# Patient Record
Sex: Female | Born: 1965 | Race: White | Hispanic: No | Smoking: Never smoker
Health system: Southern US, Community
[De-identification: ages and names within clinical notes are randomized; demographics above are authoritative.]

## PROBLEM LIST (undated history)

## (undated) DIAGNOSIS — L309 Dermatitis, unspecified: Secondary | ICD-10-CM

## (undated) DIAGNOSIS — J302 Other seasonal allergic rhinitis: Secondary | ICD-10-CM

## (undated) DIAGNOSIS — Z87442 Personal history of urinary calculi: Secondary | ICD-10-CM

## (undated) DIAGNOSIS — Z9889 Other specified postprocedural states: Secondary | ICD-10-CM

## (undated) DIAGNOSIS — G43909 Migraine, unspecified, not intractable, without status migrainosus: Secondary | ICD-10-CM

## (undated) DIAGNOSIS — R351 Nocturia: Secondary | ICD-10-CM

## (undated) DIAGNOSIS — K219 Gastro-esophageal reflux disease without esophagitis: Secondary | ICD-10-CM

## (undated) DIAGNOSIS — E039 Hypothyroidism, unspecified: Secondary | ICD-10-CM

## (undated) DIAGNOSIS — T4145XA Adverse effect of unspecified anesthetic, initial encounter: Secondary | ICD-10-CM

## (undated) DIAGNOSIS — M797 Fibromyalgia: Secondary | ICD-10-CM

## (undated) DIAGNOSIS — N201 Calculus of ureter: Secondary | ICD-10-CM

## (undated) DIAGNOSIS — Z973 Presence of spectacles and contact lenses: Secondary | ICD-10-CM

## (undated) DIAGNOSIS — F32A Depression, unspecified: Secondary | ICD-10-CM

## (undated) DIAGNOSIS — G4730 Sleep apnea, unspecified: Secondary | ICD-10-CM

## (undated) DIAGNOSIS — N3010 Interstitial cystitis (chronic) without hematuria: Secondary | ICD-10-CM

## (undated) DIAGNOSIS — T8859XA Other complications of anesthesia, initial encounter: Secondary | ICD-10-CM

## (undated) DIAGNOSIS — N200 Calculus of kidney: Secondary | ICD-10-CM

## (undated) DIAGNOSIS — M1990 Unspecified osteoarthritis, unspecified site: Secondary | ICD-10-CM

## (undated) DIAGNOSIS — R350 Frequency of micturition: Secondary | ICD-10-CM

## (undated) DIAGNOSIS — G5701 Lesion of sciatic nerve, right lower limb: Secondary | ICD-10-CM

## (undated) DIAGNOSIS — R112 Nausea with vomiting, unspecified: Secondary | ICD-10-CM

## (undated) DIAGNOSIS — F329 Major depressive disorder, single episode, unspecified: Secondary | ICD-10-CM

## (undated) HISTORY — PX: WRIST SURGERY: SHX841

## (undated) HISTORY — PX: APPENDECTOMY: SHX54

## (undated) HISTORY — PX: GANGLION CYST EXCISION: SHX1691

## (undated) HISTORY — PX: WISDOM TOOTH EXTRACTION: SHX21

## (undated) HISTORY — PX: LITHOTRIPSY: SUR834

## (undated) HISTORY — PX: OTHER SURGICAL HISTORY: SHX169

## (undated) HISTORY — DX: Calculus of kidney: N20.0

---
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
## 1998-09-24 ENCOUNTER — Encounter: Payer: Self-pay | Admitting: Emergency Medicine

## 1998-09-24 ENCOUNTER — Emergency Department (HOSPITAL_COMMUNITY): Admission: EM | Admit: 1998-09-24 | Discharge: 1998-09-24 | Payer: Self-pay | Admitting: Emergency Medicine

## 1999-02-02 ENCOUNTER — Encounter (INDEPENDENT_AMBULATORY_CARE_PROVIDER_SITE_OTHER): Payer: Self-pay

## 1999-02-02 ENCOUNTER — Ambulatory Visit (HOSPITAL_COMMUNITY): Admission: RE | Admit: 1999-02-02 | Discharge: 1999-02-02 | Payer: Self-pay | Admitting: Obstetrics and Gynecology

## 1999-02-18 ENCOUNTER — Other Ambulatory Visit: Admission: RE | Admit: 1999-02-18 | Discharge: 1999-02-18 | Payer: Self-pay | Admitting: Obstetrics and Gynecology

## 1999-06-28 ENCOUNTER — Other Ambulatory Visit: Admission: RE | Admit: 1999-06-28 | Discharge: 1999-06-28 | Payer: Self-pay | Admitting: Obstetrics & Gynecology

## 1999-11-05 ENCOUNTER — Encounter: Admission: RE | Admit: 1999-11-05 | Discharge: 1999-11-05 | Payer: Self-pay | Admitting: Obstetrics and Gynecology

## 1999-11-05 ENCOUNTER — Encounter: Payer: Self-pay | Admitting: Obstetrics and Gynecology

## 1999-12-04 ENCOUNTER — Inpatient Hospital Stay (HOSPITAL_COMMUNITY): Admission: AD | Admit: 1999-12-04 | Discharge: 1999-12-04 | Payer: Self-pay | Admitting: Obstetrics & Gynecology

## 1999-12-09 HISTORY — PX: APPENDECTOMY: SHX54

## 1999-12-18 ENCOUNTER — Encounter (INDEPENDENT_AMBULATORY_CARE_PROVIDER_SITE_OTHER): Payer: Self-pay

## 1999-12-18 ENCOUNTER — Inpatient Hospital Stay (HOSPITAL_COMMUNITY): Admission: AD | Admit: 1999-12-18 | Discharge: 1999-12-22 | Payer: Self-pay | Admitting: Obstetrics & Gynecology

## 1999-12-18 ENCOUNTER — Encounter: Payer: Self-pay | Admitting: Obstetrics & Gynecology

## 2000-01-29 ENCOUNTER — Inpatient Hospital Stay (HOSPITAL_COMMUNITY): Admission: AD | Admit: 2000-01-29 | Discharge: 2000-02-01 | Payer: Self-pay | Admitting: Obstetrics and Gynecology

## 2000-02-02 ENCOUNTER — Encounter: Admission: RE | Admit: 2000-02-02 | Discharge: 2000-05-02 | Payer: Self-pay | Admitting: Obstetrics and Gynecology

## 2000-06-02 ENCOUNTER — Encounter: Admission: RE | Admit: 2000-06-02 | Discharge: 2000-07-02 | Payer: Self-pay | Admitting: Obstetrics and Gynecology

## 2000-06-27 ENCOUNTER — Other Ambulatory Visit: Admission: RE | Admit: 2000-06-27 | Discharge: 2000-06-27 | Payer: Self-pay | Admitting: Obstetrics and Gynecology

## 2000-07-10 ENCOUNTER — Encounter: Payer: Self-pay | Admitting: Obstetrics and Gynecology

## 2000-07-10 ENCOUNTER — Ambulatory Visit (HOSPITAL_COMMUNITY): Admission: RE | Admit: 2000-07-10 | Discharge: 2000-07-10 | Payer: Self-pay | Admitting: Obstetrics and Gynecology

## 2000-08-02 ENCOUNTER — Encounter: Admission: RE | Admit: 2000-08-02 | Discharge: 2000-09-01 | Payer: Self-pay | Admitting: Obstetrics and Gynecology

## 2000-10-02 ENCOUNTER — Encounter: Admission: RE | Admit: 2000-10-02 | Discharge: 2000-11-01 | Payer: Self-pay | Admitting: Obstetrics and Gynecology

## 2000-11-02 ENCOUNTER — Encounter: Admission: RE | Admit: 2000-11-02 | Discharge: 2000-12-02 | Payer: Self-pay | Admitting: Obstetrics and Gynecology

## 2001-01-02 ENCOUNTER — Encounter: Admission: RE | Admit: 2001-01-02 | Discharge: 2001-02-01 | Payer: Self-pay | Admitting: Obstetrics and Gynecology

## 2001-03-04 ENCOUNTER — Encounter: Admission: RE | Admit: 2001-03-04 | Discharge: 2001-04-03 | Payer: Self-pay | Admitting: Obstetrics and Gynecology

## 2001-04-04 ENCOUNTER — Encounter: Admission: RE | Admit: 2001-04-04 | Discharge: 2001-05-04 | Payer: Self-pay | Admitting: Obstetrics and Gynecology

## 2001-05-11 ENCOUNTER — Inpatient Hospital Stay (HOSPITAL_COMMUNITY): Admission: AD | Admit: 2001-05-11 | Discharge: 2001-05-11 | Payer: Self-pay | Admitting: Obstetrics and Gynecology

## 2001-05-12 ENCOUNTER — Ambulatory Visit (HOSPITAL_COMMUNITY): Admission: RE | Admit: 2001-05-12 | Discharge: 2001-05-12 | Payer: Self-pay | Admitting: Obstetrics and Gynecology

## 2001-05-12 ENCOUNTER — Encounter (INDEPENDENT_AMBULATORY_CARE_PROVIDER_SITE_OTHER): Payer: Self-pay | Admitting: Specialist

## 2001-06-26 ENCOUNTER — Other Ambulatory Visit: Admission: RE | Admit: 2001-06-26 | Discharge: 2001-06-26 | Payer: Self-pay | Admitting: Obstetrics and Gynecology

## 2002-05-16 ENCOUNTER — Ambulatory Visit (HOSPITAL_COMMUNITY): Admission: RE | Admit: 2002-05-16 | Discharge: 2002-05-16 | Payer: Self-pay | Admitting: Obstetrics and Gynecology

## 2002-05-16 ENCOUNTER — Encounter: Payer: Self-pay | Admitting: Obstetrics and Gynecology

## 2002-06-03 ENCOUNTER — Ambulatory Visit (HOSPITAL_COMMUNITY): Admission: RE | Admit: 2002-06-03 | Discharge: 2002-06-03 | Payer: Self-pay | Admitting: Obstetrics and Gynecology

## 2002-06-03 ENCOUNTER — Encounter: Payer: Self-pay | Admitting: Obstetrics and Gynecology

## 2002-06-14 ENCOUNTER — Other Ambulatory Visit: Admission: RE | Admit: 2002-06-14 | Discharge: 2002-06-14 | Payer: Self-pay | Admitting: Obstetrics and Gynecology

## 2002-07-14 ENCOUNTER — Encounter: Payer: Self-pay | Admitting: Obstetrics and Gynecology

## 2002-07-14 ENCOUNTER — Inpatient Hospital Stay (HOSPITAL_COMMUNITY): Admission: AD | Admit: 2002-07-14 | Discharge: 2002-07-14 | Payer: Self-pay | Admitting: Obstetrics and Gynecology

## 2002-08-06 ENCOUNTER — Encounter: Payer: Self-pay | Admitting: Obstetrics and Gynecology

## 2002-08-06 ENCOUNTER — Ambulatory Visit (HOSPITAL_COMMUNITY): Admission: RE | Admit: 2002-08-06 | Discharge: 2002-08-06 | Payer: Self-pay | Admitting: Obstetrics and Gynecology

## 2002-10-17 ENCOUNTER — Inpatient Hospital Stay (HOSPITAL_COMMUNITY): Admission: AD | Admit: 2002-10-17 | Discharge: 2002-10-17 | Payer: Self-pay | Admitting: Obstetrics and Gynecology

## 2002-12-11 ENCOUNTER — Inpatient Hospital Stay (HOSPITAL_COMMUNITY): Admission: AD | Admit: 2002-12-11 | Discharge: 2002-12-11 | Payer: Self-pay | Admitting: Obstetrics and Gynecology

## 2002-12-29 ENCOUNTER — Inpatient Hospital Stay (HOSPITAL_COMMUNITY): Admission: AD | Admit: 2002-12-29 | Discharge: 2002-12-29 | Payer: Self-pay | Admitting: Obstetrics and Gynecology

## 2003-01-07 ENCOUNTER — Inpatient Hospital Stay (HOSPITAL_COMMUNITY): Admission: AD | Admit: 2003-01-07 | Discharge: 2003-01-09 | Payer: Self-pay | Admitting: Obstetrics and Gynecology

## 2004-04-01 ENCOUNTER — Other Ambulatory Visit: Admission: RE | Admit: 2004-04-01 | Discharge: 2004-04-01 | Payer: Self-pay | Admitting: Obstetrics and Gynecology

## 2004-07-19 ENCOUNTER — Emergency Department (HOSPITAL_COMMUNITY): Admission: EM | Admit: 2004-07-19 | Discharge: 2004-07-19 | Payer: Self-pay | Admitting: Emergency Medicine

## 2004-11-11 ENCOUNTER — Ambulatory Visit (HOSPITAL_COMMUNITY): Admission: RE | Admit: 2004-11-11 | Discharge: 2004-11-11 | Payer: Self-pay | Admitting: Obstetrics and Gynecology

## 2005-01-07 ENCOUNTER — Ambulatory Visit (HOSPITAL_COMMUNITY): Admission: RE | Admit: 2005-01-07 | Discharge: 2005-01-07 | Payer: Self-pay | Admitting: Obstetrics and Gynecology

## 2005-01-23 ENCOUNTER — Inpatient Hospital Stay (HOSPITAL_COMMUNITY): Admission: AD | Admit: 2005-01-23 | Discharge: 2005-01-23 | Payer: Self-pay | Admitting: Obstetrics and Gynecology

## 2005-01-26 ENCOUNTER — Ambulatory Visit (HOSPITAL_COMMUNITY): Admission: RE | Admit: 2005-01-26 | Discharge: 2005-01-26 | Payer: Self-pay | Admitting: Obstetrics and Gynecology

## 2005-02-07 HISTORY — PX: LITHOTRIPSY: SUR834

## 2005-02-15 ENCOUNTER — Inpatient Hospital Stay (HOSPITAL_COMMUNITY): Admission: AD | Admit: 2005-02-15 | Discharge: 2005-02-15 | Payer: Self-pay | Admitting: Obstetrics and Gynecology

## 2005-04-05 ENCOUNTER — Inpatient Hospital Stay (HOSPITAL_COMMUNITY): Admission: AD | Admit: 2005-04-05 | Discharge: 2005-04-05 | Payer: Self-pay | Admitting: Obstetrics and Gynecology

## 2005-04-16 ENCOUNTER — Inpatient Hospital Stay (HOSPITAL_COMMUNITY): Admission: AD | Admit: 2005-04-16 | Discharge: 2005-04-18 | Payer: Self-pay | Admitting: Obstetrics and Gynecology

## 2005-06-20 ENCOUNTER — Ambulatory Visit (HOSPITAL_COMMUNITY): Admission: RE | Admit: 2005-06-20 | Discharge: 2005-06-20 | Payer: Self-pay | Admitting: Urology

## 2005-06-21 ENCOUNTER — Emergency Department (HOSPITAL_COMMUNITY): Admission: EM | Admit: 2005-06-21 | Discharge: 2005-06-21 | Payer: Self-pay | Admitting: *Deleted

## 2005-06-23 ENCOUNTER — Ambulatory Visit (HOSPITAL_COMMUNITY): Admission: RE | Admit: 2005-06-23 | Discharge: 2005-06-24 | Payer: Self-pay | Admitting: Urology

## 2005-06-23 ENCOUNTER — Encounter: Payer: Self-pay | Admitting: Urology

## 2005-07-28 ENCOUNTER — Other Ambulatory Visit: Admission: RE | Admit: 2005-07-28 | Discharge: 2005-07-28 | Payer: Self-pay | Admitting: Obstetrics and Gynecology

## 2005-11-25 ENCOUNTER — Ambulatory Visit: Payer: Self-pay | Admitting: Internal Medicine

## 2006-04-16 ENCOUNTER — Emergency Department (HOSPITAL_COMMUNITY): Admission: EM | Admit: 2006-04-16 | Discharge: 2006-04-17 | Payer: Self-pay | Admitting: Emergency Medicine

## 2006-04-19 ENCOUNTER — Ambulatory Visit: Payer: Self-pay | Admitting: Internal Medicine

## 2006-04-27 ENCOUNTER — Ambulatory Visit: Payer: Self-pay | Admitting: Internal Medicine

## 2006-09-05 ENCOUNTER — Telehealth: Payer: Self-pay | Admitting: Internal Medicine

## 2006-09-05 DIAGNOSIS — J3089 Other allergic rhinitis: Secondary | ICD-10-CM

## 2006-09-05 DIAGNOSIS — F322 Major depressive disorder, single episode, severe without psychotic features: Secondary | ICD-10-CM | POA: Insufficient documentation

## 2006-09-05 DIAGNOSIS — J302 Other seasonal allergic rhinitis: Secondary | ICD-10-CM | POA: Insufficient documentation

## 2006-09-05 DIAGNOSIS — Z87442 Personal history of urinary calculi: Secondary | ICD-10-CM

## 2006-09-06 ENCOUNTER — Ambulatory Visit: Payer: Self-pay | Admitting: Internal Medicine

## 2006-09-06 DIAGNOSIS — H8110 Benign paroxysmal vertigo, unspecified ear: Secondary | ICD-10-CM

## 2006-10-12 ENCOUNTER — Ambulatory Visit: Payer: Self-pay | Admitting: Internal Medicine

## 2006-10-12 DIAGNOSIS — M542 Cervicalgia: Secondary | ICD-10-CM

## 2007-07-10 ENCOUNTER — Ambulatory Visit: Payer: Self-pay | Admitting: Internal Medicine

## 2007-07-10 DIAGNOSIS — E663 Overweight: Secondary | ICD-10-CM | POA: Insufficient documentation

## 2007-08-31 ENCOUNTER — Telehealth: Payer: Self-pay | Admitting: Internal Medicine

## 2007-09-04 ENCOUNTER — Ambulatory Visit: Payer: Self-pay | Admitting: Internal Medicine

## 2007-09-04 DIAGNOSIS — B356 Tinea cruris: Secondary | ICD-10-CM | POA: Insufficient documentation

## 2007-09-27 ENCOUNTER — Encounter: Admission: RE | Admit: 2007-09-27 | Discharge: 2007-10-30 | Payer: Self-pay | Admitting: Internal Medicine

## 2007-11-01 ENCOUNTER — Encounter: Admission: RE | Admit: 2007-11-01 | Discharge: 2007-11-01 | Payer: Self-pay | Admitting: Obstetrics and Gynecology

## 2007-12-04 ENCOUNTER — Ambulatory Visit: Payer: Self-pay | Admitting: Internal Medicine

## 2007-12-04 DIAGNOSIS — J029 Acute pharyngitis, unspecified: Secondary | ICD-10-CM

## 2007-12-04 DIAGNOSIS — J069 Acute upper respiratory infection, unspecified: Secondary | ICD-10-CM | POA: Insufficient documentation

## 2007-12-28 ENCOUNTER — Telehealth: Payer: Self-pay | Admitting: Internal Medicine

## 2008-01-02 ENCOUNTER — Ambulatory Visit: Payer: Self-pay | Admitting: Internal Medicine

## 2008-01-22 ENCOUNTER — Telehealth: Payer: Self-pay | Admitting: Internal Medicine

## 2008-04-18 ENCOUNTER — Ambulatory Visit: Payer: Self-pay | Admitting: Internal Medicine

## 2008-05-23 ENCOUNTER — Ambulatory Visit: Payer: Self-pay | Admitting: Internal Medicine

## 2008-08-25 ENCOUNTER — Ambulatory Visit: Payer: Self-pay | Admitting: Internal Medicine

## 2008-10-16 ENCOUNTER — Ambulatory Visit: Payer: Self-pay | Admitting: Family Medicine

## 2008-10-16 DIAGNOSIS — R197 Diarrhea, unspecified: Secondary | ICD-10-CM

## 2008-10-28 ENCOUNTER — Ambulatory Visit: Payer: Self-pay | Admitting: Internal Medicine

## 2008-11-03 ENCOUNTER — Encounter: Admission: RE | Admit: 2008-11-03 | Discharge: 2008-11-03 | Payer: Self-pay | Admitting: Obstetrics and Gynecology

## 2009-01-23 ENCOUNTER — Telehealth: Payer: Self-pay | Admitting: Internal Medicine

## 2009-02-23 ENCOUNTER — Ambulatory Visit: Payer: Self-pay | Admitting: Internal Medicine

## 2009-06-25 ENCOUNTER — Encounter: Payer: Self-pay | Admitting: Internal Medicine

## 2009-08-26 ENCOUNTER — Ambulatory Visit: Payer: Self-pay | Admitting: Internal Medicine

## 2009-11-26 ENCOUNTER — Ambulatory Visit: Payer: Self-pay | Admitting: Internal Medicine

## 2009-11-26 DIAGNOSIS — R609 Edema, unspecified: Secondary | ICD-10-CM

## 2009-12-08 ENCOUNTER — Emergency Department (HOSPITAL_COMMUNITY): Admission: EM | Admit: 2009-12-08 | Discharge: 2009-12-08 | Payer: Self-pay | Admitting: Emergency Medicine

## 2009-12-18 ENCOUNTER — Encounter: Admission: RE | Admit: 2009-12-18 | Discharge: 2009-12-18 | Payer: Self-pay | Admitting: Obstetrics and Gynecology

## 2009-12-22 ENCOUNTER — Emergency Department (HOSPITAL_COMMUNITY): Admission: EM | Admit: 2009-12-22 | Discharge: 2009-12-22 | Payer: Self-pay | Admitting: Family Medicine

## 2010-03-11 NOTE — Assessment & Plan Note (Signed)
Summary: 6 mo rov/mm   Vital Signs:  Patient profile:   45 year old female Weight:      218 pounds Temp:     98.1 degrees F oral BP sitting:   100 / 64  (right arm) Cuff size:   regular  Vitals Entered By: Duard Brady LPN (August 26, 2009 11:21 AM) CC: 6 mos rov - ok Is Patient Diabetic? No   CC:  6 mos rov - ok.  History of Present Illness: 45 year old patient has a history of chronic depression.  She was last seen here in January and doing quite well.  She has been followed by GI and neurology and has improved on a gluten-free diet.  Apparently, celiac serologies were negative, however.  There have been some stressors.  Her husband is still unemployed.  She has been unsuccessful losing weight in spite of her diet  Allergies: 1)  ! Sulf-10  Past History:  Past Medical History: Reviewed history from 07/10/2007 and no changes required. Obesity Depression Allergic rhinitis Nephrolithiasis, hx of history of vitamin D deficiency  Review of Systems  The patient denies anorexia, fever, weight loss, weight gain, vision loss, decreased hearing, hoarseness, chest pain, syncope, dyspnea on exertion, peripheral edema, prolonged cough, headaches, hemoptysis, abdominal pain, melena, hematochezia, severe indigestion/heartburn, hematuria, incontinence, genital sores, muscle weakness, suspicious skin lesions, transient blindness, difficulty walking, depression, unusual weight change, abnormal bleeding, enlarged lymph nodes, angioedema, and breast masses.    Physical Exam  General:  overweight-appearing.  overweight-appearing.   Head:  Normocephalic and atraumatic without obvious abnormalities. No apparent alopecia or balding. Mouth:  Oral mucosa and oropharynx without lesions or exudates.  Teeth in good repair. Neck:  No deformities, masses, or tenderness noted. Lungs:  Normal respiratory effort, chest expands symmetrically. Lungs are clear to auscultation, no crackles or  wheezes. Heart:  Normal rate and regular rhythm. S1 and S2 normal without gallop, murmur, click, rub or other extra sounds. Psych:  Cognition and judgment appear intact. Alert and cooperative with normal attention span and concentration. No apparent delusions, illusions, hallucinations   Impression & Recommendations:  Problem # 1:  DIARRHEA (ICD-787.91) improved on present gluten-free diet  Problem # 2:  DEPRESSION (ICD-311)  Her updated medication list for this problem includes:    Pristiq 100 Mg Xr24h-tab (Desvenlafaxine succinate) ..... One by mouth daily    Hydroxyzine Hcl 10 Mg Tabs (Hydroxyzine hcl) ..... Qhs will continue her present regimen.  She feels that her depression is cyclical and is hopeful for improvement.  She has been on a number of SSRIs in the past.  Will consider Wellbutrin if she fails to improve  Her updated medication list for this problem includes:    Pristiq 100 Mg Xr24h-tab (Desvenlafaxine succinate) ..... One by mouth daily    Hydroxyzine Hcl 10 Mg Tabs (Hydroxyzine hcl) ..... Qhs  Complete Medication List: 1)  Maxalt 10 Mg Tabs (Rizatriptan benzoate) .... Once daily as needed migraine 2)  Terbinafine Hcl 1 % Crea (Terbinafine hcl) .... Use twice daily 3)  Pristiq 100 Mg Xr24h-tab (Desvenlafaxine succinate) .... One by mouth daily 4)  Triamcinolone Acetonide 0.1 % Crea (Triamcinolone acetonide) .... Apply two times a day 5)  Elmiron 100 Mg Caps (Pentosan polysulfate sodium) .... 2 by mouth bid 6)  Hydroxyzine Hcl 10 Mg Tabs (Hydroxyzine hcl) .... Qhs 7)  Zyrtec Allergy 10 Mg Caps (Cetirizine hcl) .... Prn  Patient Instructions: 1)  Please schedule a follow-up appointment in 3 months. 2)  It  is important that you exercise regularly at least 20 minutes 5 times a week. If you develop chest pain, have severe difficulty breathing, or feel very tired , stop exercising immediately and seek medical attention. 3)  You need to lose weight. Consider a lower calorie  diet and regular exercise.  Prescriptions: PRISTIQ 100 MG XR24H-TAB (DESVENLAFAXINE SUCCINATE) One by mouth daily  #90 x 6   Entered and Authorized by:   Gordy Savers  MD   Signed by:   Gordy Savers  MD on 08/26/2009   Method used:   Electronically to        [facility]  [address] [id number]* (retail)       [address]       [place], [place]  [address]       Ph: [phone] or [phone]       Fax: [phone]   RxID:   [id number] MAXALT 10 MG  TABS (RIZATRIPTAN BENZOATE) once daily as needed migraine  #6 Tablet x 6   Entered and Authorized by:   Gordy Savers  MD   Signed by:   Gordy Savers  MD on 08/26/2009   Method used:   Electronically to        [facility]  [address] [id number]* (retail)       [address]       [place], [place]  [address]       Ph: [phone] or [phone]       Fax: [phone]   RxID:   [id number]

## 2010-03-11 NOTE — Assessment & Plan Note (Signed)
Summary: laryngitis and edema in legs/cjr   Vital Signs:  Patient profile:   45 year old female Height:      64 inches Weight:      218 pounds BMI:     37.55 Temp:     98.7 degrees F oral Pulse rate:   80 / minute Resp:     14 per minute BP sitting:   122 / 80  (left arm)  Vitals Entered By: Willy Eddy, LPN (November 26, 2009 9:22 AM) CC: c/o laryngitis for 1-2 weeks- c/o lower extremith swelling Is Patient Diabetic? No   CC:  c/o laryngitis for 1-2 weeks- c/o lower extremith swelling.  History of Present Illness: 45 year old patient, who presents with a 10-day history of hoarseness, mild headache and sore throat.  There is been no chest pain, shortness of breath, or productive cough.  No documented fever.  She does have a history of depression and allergic rhinitis and has been on Zyrtec therapy.  She has had a history of migraines, which have been stable.  Another complaint is the live edema, worse towards the end of the day.  She has a history also of exogenous obesity  Preventive Screening-Counseling & Management  Alcohol-Tobacco     Smoking Status: never  Current Problems (verified): 1)  Diarrhea  (ICD-787.91) 2)  Pharyngitis  (ICD-462) 3)  Uri  (ICD-465.9) 4)  Tinea Cruris  (ICD-110.3) 5)  Overweight  (ICD-278.02) 6)  Neck Pain  (ICD-723.1) 7)  Vertigo, Benign Paroxysmal Position  (ICD-386.11) 8)  Nephrolithiasis, Hx of  (ICD-V13.01) 9)  Allergic Rhinitis  (ICD-477.9) 10)  Depression  (ICD-311)  Current Medications (verified): 1)  Maxalt 10 Mg  Tabs (Rizatriptan Benzoate) .... Once Daily As Needed Migraine 2)  Terbinafine Hcl 1 %  Crea (Terbinafine Hcl) .... Use Twice Daily 3)  Pristiq 100 Mg Xr24h-Tab (Desvenlafaxine Succinate) .... One By Mouth Daily 4)  Triamcinolone Acetonide 0.1 % Crea (Triamcinolone Acetonide) .... Apply Two Times A Day 5)  Elmiron 100 Mg Caps (Pentosan Polysulfate Sodium) .... 2 By Mouth Bid 6)  Hydroxyzine Hcl 10 Mg Tabs (Hydroxyzine  Hcl) .... Qhs 7)  Zyrtec Allergy 10 Mg Caps (Cetirizine Hcl) .... Prn  Allergies (verified): 1)  ! Sulf-10  Past History:  Past Medical History: Reviewed history from 07/10/2007 and no changes required. Obesity Depression Allergic rhinitis Nephrolithiasis, hx of history of vitamin D deficiency  Review of Systems       The patient complains of anorexia and peripheral edema.  The patient denies fever, weight loss, weight gain, vision loss, decreased hearing, hoarseness, chest pain, syncope, dyspnea on exertion, prolonged cough, headaches, hemoptysis, abdominal pain, melena, hematochezia, severe indigestion/heartburn, hematuria, incontinence, genital sores, muscle weakness, suspicious skin lesions, transient blindness, difficulty walking, depression, unusual weight change, abnormal bleeding, enlarged lymph nodes, angioedema, and breast masses.    Physical Exam  General:  overweight-appearing.  normal blood pressure.  Afebrile.overweight-appearing.   Head:  Normocephalic and atraumatic without obvious abnormalities. No apparent alopecia or balding. Eyes:  No corneal or conjunctival inflammation noted. EOMI. Perrla. Funduscopic exam benign, without hemorrhages, exudates or papilledema. Vision grossly normal. Ears:  External ear exam shows no significant lesions or deformities.  Otoscopic examination reveals clear canals, tympanic membranes are intact bilaterally without bulging, retraction, inflammation or discharge. Hearing is grossly normal bilaterally. Nose:  External nasal examination shows no deformity or inflammation. Nasal mucosa are pink and moist without lesions or exudates. Mouth:  Oral mucosa and oropharynx without lesions or exudates.  Teeth in good repair. Neck:  No deformities, masses, or tenderness noted. Lungs:  Normal respiratory effort, chest expands symmetrically. Lungs are clear to auscultation, no crackles or wheezes. Heart:  Normal rate and regular rhythm. S1 and S2  normal without gallop, murmur, click, rub or other extra sounds. Extremities:  1+ left pedal edema and 1+ right pedal edema.  1+ left pedal edema.     Impression & Recommendations:  Problem # 1:  URI (ICD-465.9)  Her updated medication list for this problem includes:    Zyrtec Allergy 10 Mg Caps (Cetirizine hcl) .[name]... Prn  Her updated medication list for this problem includes:    Zyrtec Allergy 10 Mg Caps (Cetirizine hcl) .[name]... Prn  Problem # 2:  PEDAL EDEMA (ICD-782.3)  Her updated medication list for this problem includes:    Furosemide 40 Mg Tabs (Furosemide) ..... One daily  Her updated medication list for this problem includes:    Furosemide 40 Mg Tabs (Furosemide) ..... One daily  Complete Medication List: 1)  Maxalt 10 Mg Tabs (Rizatriptan benzoate) .... Once daily as needed migraine 2)  Terbinafine Hcl 1 % Crea (Terbinafine hcl) .... Use twice daily 3)  Pristiq 100 Mg Xr24h-tab (Desvenlafaxine succinate) .... One by mouth daily 4)  Triamcinolone Acetonide 0.1 % Crea (Triamcinolone acetonide) .... Apply two times a day 5)  Elmiron 100 Mg Caps (Pentosan polysulfate sodium) .... 2 by mouth bid 6)  Hydroxyzine Hcl 10 Mg Tabs (Hydroxyzine hcl) .... Qhs 7)  Zyrtec Allergy 10 Mg Caps (Cetirizine hcl) .... Prn 8)  Furosemide 40 Mg Tabs (Furosemide) .... One daily  Patient Instructions: 1)  Limit your Sodium (Salt) to less than 2 grams a day(slightly less than 1/2 a teaspoon) to prevent fluid retention, swelling, or worsening of symptoms. 2)  It is important that you exercise regularly at least 20 minutes 5 times a week. If you develop chest pain, have severe difficulty breathing, or feel very tired , stop exercising immediately and seek medical attention. 3)  You need to lose weight. Consider a lower calorie diet and regular exercise.  Prescriptions: FUROSEMIDE 40 MG TABS (FUROSEMIDE) one daily  #30 x 3   Entered and Authorized by:   Gordy Savers  MD   Signed by:   Gordy Savers  MD on 11/26/2009   Method used:   Electronically to        [facility]  [address] [id number]* (retail)       [address]       [place], [place]  [address]       Ph: [phone] or [phone]       Fax: [phone]   RxID:   [id number]    Orders Added: 1)  Est. Patient Level III [[address]]

## 2010-03-11 NOTE — Letter (Signed)
[facility]   Imported By: Maryln Gottron 07/01/2009 12:24:11  _____________________________________________________________________  External Attachment:    Type:   Image     Comment:   External Document

## 2010-03-11 NOTE — Assessment & Plan Note (Signed)
Summary: 6 MONTH ROV/NJR   Vital Signs:  Patient profile:   45 year old female Weight:      215 pounds Temp:     98.6 degrees F oral BP sitting:   106 / 80  (left arm) Cuff size:   regular  Vitals Entered By: Raechel Ache, RN (February 23, 2009 11:56 AM) CC: 6 mo ROV   CC:  6 mo ROV.  History of Present Illness: 45 year old patient who is seen today for follow-up.  she has done quite well over the past few months.  The Pristiq has been titrated to 100 mg daily, and she has done quite well at this dose.  She feels that her depression is doing quite well.  Chest history migraine headaches, which have been very stable over this past interval.  There has been some situational stress with her husband unemployed, but in general, she is doing quite well.  There has been some modest weight and over the past 6 months.  Allergies: 1)  ! Sulf-10  Past History:  Past Medical History: Reviewed history from 07/10/2007 and no changes required. Obesity Depression Allergic rhinitis Nephrolithiasis, hx of history of vitamin D deficiency  Review of Systems       The patient complains of weight gain and depression.  The patient denies anorexia, fever, weight loss, vision loss, decreased hearing, hoarseness, chest pain, syncope, dyspnea on exertion, peripheral edema, prolonged cough, headaches, hemoptysis, abdominal pain, melena, hematochezia, severe indigestion/heartburn, hematuria, incontinence, genital sores, muscle weakness, suspicious skin lesions, transient blindness, difficulty walking, unusual weight change, abnormal bleeding, enlarged lymph nodes, angioedema, and breast masses.    Physical Exam  General:  overweight-appearing.  low-normal blood pressureoverweight-appearing.   Head:  Normocephalic and atraumatic without obvious abnormalities. No apparent alopecia or balding. Eyes:  No corneal or conjunctival inflammation noted. EOMI. Perrla. Funduscopic exam benign, without hemorrhages,  exudates or papilledema. Vision grossly normal. Mouth:  Oral mucosa and oropharynx without lesions or exudates.  Teeth in good repair. Neck:  No deformities, masses, or tenderness noted. Lungs:  Normal respiratory effort, chest expands symmetrically. Lungs are clear to auscultation, no crackles or wheezes. Heart:  Normal rate and regular rhythm. S1 and S2 normal without gallop, murmur, click, rub or other extra sounds. Abdomen:  Bowel sounds positive,abdomen soft and non-tender without masses, organomegaly or hernias noted.   Impression & Recommendations:  Problem # 1:  OVERWEIGHT (ICD-278.02)  Orders: Prescription Created Electronically [phone])  Problem # 2:  DEPRESSION (ICD-311)  Her updated medication list for this problem includes:    Pristiq 100 Mg Xr24h-tab (Desvenlafaxine succinate) ..... One by mouth daily    Her updated medication list for this problem includes:    Pristiq 100 Mg Xr24h-tab (Desvenlafaxine succinate) ..... One by mouth daily  Orders: Prescription Created Electronically [phone])  Complete Medication List: 1)  Maxalt 10 Mg Tabs (Rizatriptan benzoate) .... Once daily as needed migraine 2)  Terbinafine Hcl 1 % Crea (Terbinafine hcl) .... Use twice daily 3)  Pristiq 100 Mg Xr24h-tab (Desvenlafaxine succinate) .... One by mouth daily 4)  Triamcinolone Acetonide 0.1 % Crea (Triamcinolone acetonide) .... Apply two times a day  Patient Instructions: 1)  Please schedule a follow-up appointment in 6 months. 2)  Limit your Sodium (Salt). 3)  It is important that you exercise regularly at least 20 minutes 5 times a week. If you develop chest pain, have severe difficulty breathing, or feel very tired , stop exercising immediately and seek medical attention.  4)  You need to lose weight. Consider a lower calorie diet and regular exercise.  Prescriptions: PRISTIQ 100 MG XR24H-TAB (DESVENLAFAXINE SUCCINATE) One by mouth daily  #90 x 6   Entered and Authorized by:    Gordy Savers  MD   Signed by:   Gordy Savers  MD on 02/23/2009   Method used:   Print then Give to Patient   RxID:   [id number] MAXALT 10 MG  TABS (RIZATRIPTAN BENZOATE) once daily as needed migraine  #6 Tablet x 6   Entered and Authorized by:   Gordy Savers  MD   Signed by:   Gordy Savers  MD on 02/23/2009   Method used:   Print then Give to Patient   RxID:   [id number] PRISTIQ 100 MG XR24H-TAB (DESVENLAFAXINE SUCCINATE) One by mouth daily  #90 x 6   Entered and Authorized by:   Gordy Savers  MD   Signed by:   Gordy Savers  MD on 02/23/2009   Method used:   Electronically to        [facility]  [address] [id number]* (retail)       [address]       [place], [place]  [address]       Ph: [phone] or [phone]       Fax: [phone]   RxID:   [id number] PRISTIQ 100 MG XR24H-TAB (DESVENLAFAXINE SUCCINATE) One by mouth daily  #90 x 6   Entered and Authorized by:   Gordy Savers  MD   Signed by:   Gordy Savers  MD on 02/23/2009   Method used:   Electronically to        [facility]  [address] [id number]* (retail)       [address]       [place], [place]  [address]       Ph: [phone] or [phone]       Fax: [phone]   RxID:   [id number] MAXALT 10 MG  TABS (RIZATRIPTAN BENZOATE) once daily as needed migraine  #6 Tablet x 6   Entered and Authorized by:   Gordy Savers  MD   Signed by:   Gordy Savers  MD on 02/23/2009   Method used:   Electronically to        [facility]  [address] [id number]* (retail)       [address]       [place], [place]  [address]       Ph: [phone] or [phone]       Fax: [phone]   RxID:   [id number]

## 2010-03-23 ENCOUNTER — Telehealth: Payer: Self-pay | Admitting: Internal Medicine

## 2010-03-23 DIAGNOSIS — F329 Major depressive disorder, single episode, unspecified: Secondary | ICD-10-CM

## 2010-03-23 MED ORDER — DESVENLAFAXINE SUCCINATE ER 100 MG PO TB24: 100.0000 mg | ORAL_TABLET | Freq: Every day | ORAL | Status: DC

## 2010-03-23 NOTE — Telephone Encounter (Signed)
[facility] to [facility]

## 2010-03-23 NOTE — Telephone Encounter (Signed)
Refill Pristic Er 100mg  to [facility] [address]

## 2010-05-24 ENCOUNTER — Ambulatory Visit (INDEPENDENT_AMBULATORY_CARE_PROVIDER_SITE_OTHER)
Admission: RE | Admit: 2010-05-24 | Discharge: 2010-05-24 | Disposition: A | Discharge status: Home / Self Care | Payer: BC Managed Care – PPO | Source: Ambulatory Visit | Attending: Cardiology | Admitting: Cardiology

## 2010-05-24 ENCOUNTER — Telehealth: Payer: Self-pay | Admitting: *Deleted

## 2010-05-24 ENCOUNTER — Inpatient Hospital Stay (INDEPENDENT_AMBULATORY_CARE_PROVIDER_SITE_OTHER)
Admission: RE | Admit: 2010-05-24 | Discharge: 2010-05-24 | Disposition: A | Discharge status: Home / Self Care | Payer: BC Managed Care – PPO | Source: Ambulatory Visit | Attending: Family Medicine | Admitting: Family Medicine

## 2010-05-24 DIAGNOSIS — G43009 Migraine without aura, not intractable, without status migrainosus: Secondary | ICD-10-CM

## 2010-05-24 DIAGNOSIS — R51 Headache: Secondary | ICD-10-CM

## 2010-05-24 NOTE — Telephone Encounter (Addendum)
Pt was seen in [facility] this am wth orbital pain, and told if pain did not subside to have Dr. Kirtland Bouchard order STAT CT Head CT.  Per Dr. Burna Cash CT with out contrast.

## 2010-05-28 ENCOUNTER — Ambulatory Visit (INDEPENDENT_AMBULATORY_CARE_PROVIDER_SITE_OTHER): Payer: BC Managed Care – PPO | Admitting: Internal Medicine

## 2010-05-28 ENCOUNTER — Encounter: Payer: Self-pay | Admitting: Internal Medicine

## 2010-05-28 DIAGNOSIS — J069 Acute upper respiratory infection, unspecified: Secondary | ICD-10-CM

## 2010-05-28 DIAGNOSIS — R51 Headache: Secondary | ICD-10-CM

## 2010-05-28 MED ORDER — DOXYCYCLINE HYCLATE 100 MG PO TABS: 100.0000 mg | ORAL_TABLET | Freq: Two times a day (BID) | ORAL | Status: DC

## 2010-05-29 ENCOUNTER — Encounter: Payer: Self-pay | Admitting: Internal Medicine

## 2010-05-29 DIAGNOSIS — R51 Headache: Secondary | ICD-10-CM | POA: Insufficient documentation

## 2010-05-29 DIAGNOSIS — J069 Acute upper respiratory infection, unspecified: Secondary | ICD-10-CM | POA: Insufficient documentation

## 2010-05-29 DIAGNOSIS — R519 Headache, unspecified: Secondary | ICD-10-CM | POA: Insufficient documentation

## 2010-05-29 NOTE — Progress Notes (Signed)
  Subjective:    Patient ID: [name], female    DOB: [birth year], 45 y.o.   MRN: [id number]  HPI Patient presents to clinic for evaluation of laryngitis. Notes 5 day history o fscratchy sore throat laryngitis productive cough and nasal green discharge. Denies fever chills, shortness of breath or wheezing. Also was seen at urgent care April 16 with frontal headache right neck pain and thought to have migraine headache. Noncontrast head CTunremarkable. No neurologic deficits. States right upper periorbital pain improved but not resolved. Maxalt only helped minimally.No alleviating or exacerbating factors. No other complaints.  Reviewed past medical history, medications and allergies    Review of Systemsee history of present illness     Objective:   Physical Exam  Physical Exam  Vitals reviewed. Constitutional:  appears well-developed and well-nourished. No distress.  HENT:  Head: Normocephalic and atraumatic.  Right Ear: Tympanic membrane, external ear and ear canal normal.  Left Ear: Tympanic membrane, external ear and ear canal normal.  Nose: Nose normal.  Mouth/Throat: Oropharynx is clear and moist. No oropharyngeal exudate.  Eyes: Conjunctivae and EOM are normal. Pupils are equal, round, and reactive to light. Right eye exhibits no discharge. Left eye exhibits no discharge. No scleral icterus.  Neck: Neck supple. No thyromegaly present.  Cardiovascular: Normal rate, regular rhythm and normal heart sounds.  Exam reveals no gallop and no friction rub.   No murmur heard. Pulmonary/Chest: Effort normal and breath sounds normal. No respiratory distress.  has no wheezes.  has no rales.  Lymphadenopathy:    She has no cervical adenopathy.  Neurological:  is alert.  Skin: Skin is warm and dry.  not diaphoretic.  Psychiatric: normal mood and affect.          Assessment & Plan:

## 2010-05-29 NOTE — Assessment & Plan Note (Signed)
Suspect viral etiology however beginning antibiotics as above for possible sinusitis.

## 2010-05-29 NOTE — Assessment & Plan Note (Signed)
Mildly improved. Reviewed normal noncontrast head CT. Consider possible contribution from paranasal sinusitis. Begin antibiotic course and followup if no improvement

## 2010-05-31 ENCOUNTER — Other Ambulatory Visit: Payer: Self-pay | Admitting: Internal Medicine

## 2010-05-31 ENCOUNTER — Telehealth: Payer: Self-pay | Admitting: *Deleted

## 2010-05-31 MED ORDER — LEVOFLOXACIN 500 MG PO TABS: 500.0000 mg | ORAL_TABLET | Freq: Every day | ORAL | Status: AC

## 2010-05-31 NOTE — Telephone Encounter (Signed)
Stop doxy. levaquin 500mg  po qd x10d if not allergic and no interactions

## 2010-05-31 NOTE — Telephone Encounter (Addendum)
After one dose of Doxycycline, pt had vomiting and abd pain.  Still has all the sinus and headache symptoms.  Want a different antibiotic.   Notified pt of Dr. Ilda Foil recommendations.

## 2010-06-02 ENCOUNTER — Telehealth: Payer: Self-pay | Admitting: *Deleted

## 2010-06-02 NOTE — Telephone Encounter (Signed)
It is early to judge the antibiotic after only two days. Would continue abx to completion and let us know if no improvement.

## 2010-06-02 NOTE — Telephone Encounter (Signed)
Notified pt. 

## 2010-06-02 NOTE — Telephone Encounter (Signed)
Pt. Calls still complaining of laryngitis, and headache.  Has taken 2 days of Levaquin.  Feels better, otherwise.

## 2010-06-25 ENCOUNTER — Encounter: Payer: Self-pay | Admitting: Internal Medicine

## 2010-06-25 ENCOUNTER — Ambulatory Visit (INDEPENDENT_AMBULATORY_CARE_PROVIDER_SITE_OTHER): Payer: BC Managed Care – PPO | Admitting: Internal Medicine

## 2010-06-25 VITALS — BP 110/70 | Temp 98.0°F | Wt 216.0 lb

## 2010-06-25 DIAGNOSIS — J069 Acute upper respiratory infection, unspecified: Secondary | ICD-10-CM

## 2010-06-25 DIAGNOSIS — F329 Major depressive disorder, single episode, unspecified: Secondary | ICD-10-CM

## 2010-06-25 MED ORDER — DESVENLAFAXINE SUCCINATE ER 100 MG PO TB24: 100.0000 mg | ORAL_TABLET | Freq: Every day | ORAL | Status: DC

## 2010-06-25 NOTE — Op Note (Signed)
NAMEDONICA, [name]               ACCOUNT NO.:  [id number]   MEDICAL RECORD NO.:  [id number]          PATIENT TYPE:  OIB   LOCATION:  1414                         FACILITY:  [facility]   PHYSICIAN:  Jamison Neighbor, M.D.  DATE OF BIRTH:  [birth year]   DATE OF PROCEDURE:  06/23/2005  DATE OF DISCHARGE:                                 OPERATIVE REPORT   PREOPERATIVE DIAGNOSIS:  Obstructed right ureter secondary to distal  ureteral calculi following extracorporeal shock wave lithotripsy.   POSTOPERATIVE DIAGNOSIS:  Obstructed right ureter secondary to distal  ureteral calculi following extracorporeal shock wave lithotripsy.   OPERATION PERFORMED:  Cystoscopy, ureteral meatotomy, right retrograde  pyelogram with interpretation.  Ureteroscopy with basket extraction.  Right  double-J catheter insertion.   SURGEON:  Jamison Neighbor, M.D.   ANESTHESIA:  General.   COMPLICATIONS:  None.   DRAINS:  6 French x 26 cm double-J catheter.   INDICATIONS FOR PROCEDURE:  This 45 year old female had ESWL earlier in the  week.  The patient had a large stone that was almost the size of a partial  staghorn but she really wanted to try to treat this with a minimally or  noninvasive procedure.  The patient's follow-up x-rays a few days layer  showed the stone was well broken up but there was stone material impacted at  the distal ureter.  The patient began to develop a low grade fever.  She was  put on antibiotic therapy.  Ultrasound did not show clearcut hydronephrosis  but because of the Steinstrasse, it was felt that the patient needed to be  unobstructed.  She has had problems with pain and nausea and feels that she  is willing to have a stent at this point.  She gave full informed consent  for the procedure.   DESCRIPTION OF PROCEDURE:  After successful induction of general anesthesia,  the patient was placed in dorsal lithotomy position and prepped with  Betadine and draped in the usual sterile  fashion.  The cystoscope was  inserted.  The bladder was carefully inspected.  No tumors could be seen.  Left ureteral orifice was normal in configuration and location.  The right  ureteral orifice was completely impacted with stone and the patient had  clearcut evidence of a Steinstrasse.  Attempts at trying to get a ureteral  catheter or wire past the obstructing fragment at the distal end of the  ureter were unsuccessful.  A meatotomy was performed and some of the stones  that were right at the distal opening could be removed.  A ureteroscope was  then advanced into the distal ureter and the stones were bypassed. The  ureteroscope was advanced up into the more proximal ureter and for that  point a ureteral catheter was advanced to the kidney where it coiled.   Retrograde pyelogram.  A 6 French open catheter was passed over the  previously placed wire and advanced to the kidney.  Contrast was inserted to  opacify the collecting system.  The patient had hydronephrosis and also it  was noted that the kidney was low in  its position.  The patient did not have  stone debris up within the kidney, no filling defects could be seen.  The  proximal ureter down to the level where the stone had been situated was  dilated. This was really right at the ureteropelvic junction.  The remainder  of the ureter was only modestly dilated.  Some stone material could be seen  along the course of the ureter.  The guidewire was then replaced and the  ureteral catheter was removed.   The ureteroscope was advanced alongside the guidewire and fragments were  flushed out of the bladder and/or extracted.  This was done to the point  where the ureter was felt to be open and decision made to place a double-J  catheter and allow the remainder of the stones to pass.  It was felt that  with decompression and the meatotomy, that the stones should migrate out  without much difficulty.  The previously placed ureteral  guidewire was  utilized.  A 6 French by 26 cm double-J catheter was passed over the  guidewire, allowed to coil normally within the renal pelvis. The bladder was  drained.  The patient tolerated the procedure well and was taken to recovery  room in good condition.           ______________________________  Jamison Neighbor, M.D.  Electronically Signed     RJE/MEDQ  D:  06/23/2005  T:  06/24/2005  Job:  [id number]

## 2010-06-25 NOTE — Op Note (Signed)
[facility]  Patient:    [name], [name] Visit Number: [id number] MRN: [id number]          Service Type: OBS Location: [facility] Attending Physician:  Leonard Schwartz Dictated by:   Janine Limbo, M.D. Proc. Date: 05/12/01 Admit Date:  05/11/2001 Discharge Date: 05/11/2001                             Operative Report  PREOPERATIVE DIAGNOSES:       First trimester missed abortion.  POSTOPERATIVE DIAGNOSES:      First trimester missed abortion.  PROCEDURE:                    Suction dilatation and evacuation.  SURGEON:                      Janine Limbo, M.D.  ANESTHESIA:                   Monitored anesthetic control, paracervical block using 0.5% Marcaine.  DISPOSITION:                  Ms. Bowell is a 45 year old who presents with a first trimester gestation.  She has decreasing quantitative beta hCG values, uterine cramping, heavy bleeding, and she has passed tissue.  She understands the indications for her procedure and she accepts the risks of, but not limited to, anesthetic complications, bleeding, infections, and possible damage to the surrounding organs.  FINDINGS:                     The uterus had a moderate amount of products of conception.  The patients blood type is B+.  The postoperative uterus was firm and hemostasis was adequate.  PROCEDURE:                    The patient was taken to the operating room where she was given medication through her IV line.  The patients perineum and vagina were prepped with multiple layers of Betadine.  The bladder was drained of urine.  Examination under anesthesia was performed.  The patient was sterilely draped.  A paracervical block was placed using 10 cc of 0.5% Marcaine.  The uterus was sounded to 10 cm.  The cervix was dilated.  The uterine cavity was then evacuated using a #8 suction curette and then a medium sharp curette.  The cavity was felt to be clean at the end of our  procedure. The patient tolerated her procedure well.  Hemostasis was adequate.  The estimated blood loss was 30 cc.  The patient was taken to the recovery room in stable condition.  FOLLOWUP INSTRUCTIONS:        The patient was given a prescription for Vicodin one to two tablets q.4h. p.r.n. for pain.  She will also use ibuprofen 600 mg q.6h. p.r.n. for pain.  She will return to see Dr. Stefano Gaul in two to three weeks for follow-up examination.  She will return to work on May 14, 2001. She will call for questions or concerns.  She was given a copy of the postoperative instruction sheet as prepared by the [facility] for patients who have undergone laparoscopy. Dictated by:   Janine Limbo, M.D. Attending Physician:  Leonard Schwartz DD:  05/12/01 TD:  05/13/01 Job: [id number]

## 2010-06-25 NOTE — Patient Instructions (Signed)
Get plenty of rest, Drink lots of  clear liquids, and use Tylenol or ibuprofen for fever and discomfort.    Call or return to clinic prn if these symptoms worsen or fail to improve as anticipated.  

## 2010-06-25 NOTE — Progress Notes (Signed)
  Subjective:    Patient ID: [name], female    DOB: 1965-02-17, 45 y.o.   MRN: [id number]  HPI  45 year old patient who is seen today with a three-day history of hoarseness she has had minimal nonproductive cough she does have mild headache but no fever localized sinus pain. She does have a history of allergic rhinitis is been using Benadryl and some Zyrtec. In general she feels fairly well    Review of Systems  Constitutional: Negative.   HENT: Negative for hearing loss, congestion, sore throat, rhinorrhea, dental problem, sinus pressure and tinnitus.   Eyes: Negative for pain, discharge and visual disturbance.  Respiratory: Positive for cough. Negative for shortness of breath.   Cardiovascular: Negative for chest pain, palpitations and leg swelling.  Gastrointestinal: Negative for nausea, vomiting, abdominal pain, diarrhea, constipation, blood in stool and abdominal distention.  Genitourinary: Negative for dysuria, urgency, frequency, hematuria, flank pain, vaginal bleeding, vaginal discharge, difficulty urinating, vaginal pain and pelvic pain.  Musculoskeletal: Negative for joint swelling, arthralgias and gait problem.  Skin: Negative for rash.  Neurological: Negative for dizziness, syncope, speech difficulty, weakness, numbness and headaches.  Hematological: Negative for adenopathy.  Psychiatric/Behavioral: Negative for behavioral problems, dysphoric mood and agitation. The patient is not nervous/anxious.        Objective:   Physical Exam  Constitutional: She is oriented to person, place, and time. She appears well-developed and well-nourished.  HENT:  Head: Normocephalic.  Right Ear: External ear normal.  Left Ear: External ear normal.  Mouth/Throat: Oropharynx is clear and moist.  Eyes: Conjunctivae and EOM are normal. Pupils are equal, round, and reactive to light.  Neck: Normal range of motion. Neck supple. No thyromegaly present.  Cardiovascular: Normal rate, regular  rhythm, normal heart sounds and intact distal pulses.   Pulmonary/Chest: Effort normal and breath sounds normal.  Abdominal: Soft. Bowel sounds are normal. She exhibits no mass. There is no tenderness.  Musculoskeletal: Normal range of motion.  Lymphadenopathy:    She has no cervical adenopathy.  Neurological: She is alert and oriented to person, place, and time.  Skin: Skin is warm and dry. No rash noted.  Psychiatric: She has a normal mood and affect. Her behavior is normal.          Assessment & Plan:   Viral URI with hoarseness. We'll treat with Celebrex was also given samples of Allegra;  voice rest encouraged

## 2010-06-25 NOTE — Assessment & Plan Note (Signed)
[facility] OFFICE NOTE   [name], [name]                      MRN:          [id number]  DATE:11/25/2005                            DOB:          [birth year]    A 45 year old female seen today to establish with our practice.  She has a  long history of depression well-controlled with Zoloft.  She has a history  of endometriosis and is status post laparoscopy in 1995 and 2001.  She also  is followed by Dr. Randa Evens for kidney stones and interstitial cystitis.  She  has seasonal allergic rhinitis.   OPERATIONS:  1. Appendectomy.  2. Operation for ganglion cyst involving her wrist in the 1990s.   REVIEW OF SYSTEMS:  Main complaint today is an intertriginous rash between  and beneath the breasts and also in the groin and lower abdominal area.   SOCIAL HISTORY:  She is married, 1 son.   FAMILY HISTORY:  Both parents are in their late 87s.  Father with coronary  artery disease and diabetes.  Mother with diabetes and tobacco use,  hypothyroidism.  One sister with exogenous obesity.   EXAM:  GENERAL:  Revealed an overweight white female in no acute distress.  VITAL SIGNS:  Blood pressure was normal.  SKIN:  Revealed an erythematous scaly rash in the intertriginous areas of  the breasts and groin and lower abdomen.  HEENT:  Fundi, ears, nose and throat clear.  NECK:  No thyroid enlargement.  CHEST:  Clear.  CARDIOVASCULAR EXAM:  Normal heart sounds.  No murmurs.  ABDOMEN:  Obese, soft and nontender.  No organomegaly.  EXTREMITIES:  Negative.   IMPRESSION:  1. Fungal dermatitis.  2. Depression.  3. Exogenous obesity.   DISPOSITION:  Will treat with Vusion cream b.i.d.  Recheck in the spring for  general followup.  Laboratory studies checked at that time.            ______________________________  Gordy Savers, MD     PFK/MedQ  DD:  11/25/2005  DT:  11/28/2005  Job #:  [id number]

## 2010-06-25 NOTE — H&P (Signed)
NAME:  Melanie [name], Melanie [name]                         ACCOUNT NO.:  [id number]   MEDICAL RECORD NO.:  [id number]                   PATIENT TYPE:  INP   LOCATION:  9168                                 FACILITY:  [facility]   PHYSICIAN:  Osborn Coho, M.D.                DATE OF BIRTH:  03/06/1965   DATE OF ADMISSION:  01/07/2003  DATE OF DISCHARGE:                                HISTORY & PHYSICAL   This is [name] 45 year old gravida 4, para 1-0-2-1, at 12 weeks' gestation, who  presents with complaints of leaking fluid since 3 [name].m. with painful  contractions ensuing thereafter.  She reports positive  fetal movement and  denies bleeding.  Pregnancy has been followed by Hal Morales, M.D.,  and has been remarkable for:   1. Infertility.  2. Endometriosis.  3. Depression.  4. Migraines.  5. AMA.  6. Interstitial cystitis.  7. Group B strep negative.  8. Preterm contractions without cervical change.    PAST OBSTETRICAL HISTORY:  Remarkable for spontaneous abortion in 1987,  spontaneous vaginal delivery in 2001 of [name] at 36 weeks' gestation  weighing 7 pounds 5 ounces.  Remarkable for Clomid conception and  appendectomy at 34 weeks.  She had [name] spontaneous missed abortion in 2003  with [name] D&C in the first trimester.   PAST MEDICAL HISTORY:  Remarkable for endometriosis, infertility, childhood  varicella, irritable bowel syndrome (constipation and diarrhea).  She also  has [name] history of interstitial cystitis.   PAST SURGICAL HISTORY:  Remarkable for 1994 [name] ganglion cyst removed from her  wrist.  In 1996 and 2000 she had laparoscopy.  At age 83 she had wisdom  teeth removed.   FAMILY HISTORY:  Remarkable for heart disease in her father, hypertension in  her aunt, asthma in her father, bronchitis in her sister, diabetes in her  aunt, father, and sister, thyroid dysfunction in her mother, kidney problems  in her father, depression in her mother.   GENETIC HISTORY:  Remarkable for  father of the baby with decreased hearing  secondary to [name] TB medication and [name] great-grandfather who is [name] twin.   SOCIAL HISTORY:  The patient is married to [employer], who is involved and  supportive.  She is of the [employer]. She works as [name] Armed forces operational officer.  She denies any alcohol, tobacco, or drug use.   ALLERGIES:  The patient has [name] reaction to SUTURES.   MEDICATIONS:  Prenatal vitamins.   PRENATAL LABORATORY DATA:  Hemoglobin 13.4, platelets 234.  Blood type B  positive, antibody screen negative.  RPR nonreactive.  Rubella immune.  Hepatitis negative.  HIV declined.  Pap test normal.  Gonorrhea negative,  Chlamydia negative.  Toxoplasma titer negative.  Quad screen negative.  Glucola within normal limits.  Group B strep negative.   OBJECTIVE:  VITAL SIGNS:  Stable, afebrile.  HEENT:  Within normal limits.  NECK:  Thyroid normal, not enlarged.  CHEST:  Clear to auscultation.  CARDIAC:  Regular rate and rhythm.  ABDOMEN:  Gravid at 40 cm, vertex to Leopold's.  EFM shows reassuring fetal  heart rate with uterine contractions every 1-1/2 to two minutes.  PELVIC:  Cervical exam is 3-4 cm, 90% effaced, -2 station, with bulging  membranes.  There is clear fluid leaking as gross evidence of ruptured  membranes.  EXTREMITIES:  Within normal limits.   ASSESSMENT:  1. Intrauterine pregnancy at term.  2. Spontaneous rupture of membranes.  3. Active labor.  4. Group B strep negative.   PLAN:  1. Admit to birthing suites, Dr. Su Hilt notified.  2. Routine M.D. orders.  3. Epidural p.r.n.     Marie L. Williams, C.N.M.                 Osborn Coho, M.D.    MLW/MEDQ  D:  01/07/2003  T:  01/07/2003  Job:  [id number]

## 2010-06-25 NOTE — Op Note (Signed)
[facility]  Patient:    [name], [name]                      MRN: [id number] Proc. Date: 12/18/99 Adm. Date:  [id number] Disc. Date: [id number] Attending:  Cleatrice Burke                           Operative Report  PREOPERATIVE DIAGNOSIS:       Acute appendicitis.  POSTOPERATIVE DIAGNOSIS:      Acute appendicitis.  OPERATION:                    Appendectomy.  SURGEON:                      Mardene Celeste. Lurene Shadow, M.D.  ASSISTANT:                    Nurse.  ANESTHESIA:                   General.  INDICATIONS:                  This patient is a 45 year old female at [redacted] weeks gestation, her first pregnancy.  She presents with abdominal pain starting in the periumbilical region, localizing in the right lower quadrant.  With leukocytes of 20,000 plus rebound tenderness in the right lower quadrant.  CT scan of the abdomen is consistent with appendicitis.  She is brought to the operating room for appendectomy.  DESCRIPTION OF PROCEDURE:     Following the induction of satisfactory general anesthesia and with the patient positioned supinely, the abdomen was routinely prepped and draped to be included in the sterile operative field.  A right-sided abdominal incision was deepened through the skin and subcutaneous tissue.  Carrying the dissection down to the flank muscles, we then divided them in direction of their fibers down to the peritoneum.  The peritoneum was grasped and then entered.  The uterus was retracted somewhat medially and the cecum (which was retrouterine) was elevated and had to be dissected somewhat away from the retroperitoneal structures due to the position of the appendix (which was retrocecal).  The appendix was delivered; it was somewhat "gnawed" with a very broad base.  The appendix was then freed off its mesoappendiceal attachments, which were secured with clamps and ties of 2-0 silk suture.  The base of the appendix was then  amputated with a GIA stapler.  The appendix was removed in four different pathologic evaluations. All the areas of dissection along the mesoappendix were checked for hemostasis.  Additional bleeding points were treated with electrocautery or a stitch of 2-0 silk.  Sponge, instrument and sharp counts verified.  Wound was closed in various layers as follows:  The peritoneum closed with running 0 chromic catgut suture.  Internal oblique and external oblique muscles closed with 0 chromic catgut sutures.  Subcutaneous tissues reapproximated with 0 chromic catgut.  Skin closed with 4-0 monocryl suture and then reinforced with Steri-Strips.  Sterile dressings were applied.  Anesthetic reversed.  The patient removed from the operating room to the recovery room in stable condition, having tolerated the procedure well. DD:  12/19/99 TD:  12/19/99 Job: [id number]

## 2010-06-25 NOTE — Op Note (Signed)
[facility]  Patient:    [name]                       MRN: [id number] Proc. Date: 02/02/99 Adm. Date:  [id number] Attending:  Dierdre Forth Pearline                           Operative Report  PREOPERATIVE DIAGNOSES:       1. Endometriosis.                               2. Increasingly severe pelvic pain.                               3. Bilateral ovarian cysts on ultrasound.  POSTOPERATIVE DIAGNOSES:      1. Endometriosis.                               2. Pelvic pain.                               3. Bilateral ovarian cysts.                               4. Uterine fibroid.  OPERATIONS:                   1. Operative laparoscopy.                               2. Bilateral ovarian cyst aspiration.                               3. Left ovarian cystectomy.                               4. Peritoneal biopsies.  ANESTHESIA:                   General orotracheal.  ESTIMATED BLOOD LOSS:         Less than 25 cc.  COMPLICATIONS:                None.  FINDINGS:                     The uterus was normal size.  There was a 1 cm irregularity on the posterior fundus consistent with a uterine fibroid.  The uterus was retroverted.  The left fallopian tube was within normal limits.  The right fallopian tube was adherent to the cecum.  The ovaries bilaterally were elongated with simple cysts, each measuring approximately 1.5 cm on either ovary.  The fluid contained in the ovaries was clear.  There was no evidence of ovarian endometriosis.  The anterior cul-de-sac was clear.  The posterior cul-de-sac contained a bluish lesion consistent with endometriosis and on the left uterosacral ligament there was a peritoneal window and a peritoneal scarring with shortening of that left uterosacral ligament.  The right uterosacral ligament did not contain any apparent lesions  of endometriosis.  The cecum was adherent to the distal right fallopian tube, and  the appendix could not be well visualized.  There was an area of peritoneal scarring near the cecum as well.  The cecum had a firm texture to it which could be consistent with stool versus endometriotic lesion.  DESCRIPTION OF PROCEDURE:     The patient was taken to the operating room after  appropriate identification and placed on the operating table.  After the attainment of adequate general anesthesia she was placed in the modified lithotomy position. The abdomen, perineum, and vagina were prepped with multiple layers of Betadine. Foley catheter was inserted into the bladder under sterile conditions and connected to straight drainage.  The abdomen was draped as a sterile field.  A single-tooth tenaculum had been placed on the cervix.  The subumbilical area was infiltrated  with a total of 5 cc of 1/4% Marcaine.  A subumbilical incision was made and a Veress cannula placed through that incision into the peritoneal cavity.  A pneumoperitoneum was created with 3.5 L of CO2.  The Veress cannula was removed and the laparoscopic trocar placed through that incision into the peritoneal cavity. The laparoscope was placed through the trocar sleeve.  Suprapubic incisions were made to the left and right of midline and laparoscopic probe trocars placed through those incisions into the peritoneal cavity under direct visualization.  The above-noted findings were made and documented.  The right ovary was visualized nd with the aid of a hook dissector of the Harmonic Scalpel the right ovarian cyst was incised.  A biopsy of the cyst wall was then obtained.  Similar procedure was carried out on the left side, but on the left side the actual cyst wall was removed.  Hemostasis was noted to be adequate after copious irrigation.  The peritoneal fluid which was included the cyst fluid was sent for cytology.  The posterior cul-de-sac area of endometriosis was then excised and sent as an excisional  biopsy.  This was performed with he Harmonic Scalpel.  The portion of the fallopian tube which was adherent to the cecum on the right side was then bluntly dissected off with the fallopian tube appearing otherwise normal. Copious irrigation was carried out and approximately 60 cc of warm lactated Ringers left in the peritoneal cavity.  All instruments were then removed from the peritoneal cavity under direct visualization as the CO2 was allowed to escape.  The subumbilical and suprapubic incisions were then closed with subcuticular sutures of 3-0 Dexon.  All instruments were then removed from the vagina, and the Foley catheter removed.  The patient was awakened from general anesthesia after sterile dressing had been applied to her incisions.  She was taken to the recovery room in satisfactory condition having tolerated the procedure well with sponge and instrument counts correct. DD:  02/01/99 TD:  02/03/99 Job: [id number]

## 2010-06-25 NOTE — H&P (Signed)
[name], [name]               ACCOUNT NO.:  [id number]   MEDICAL RECORD NO.:  [id number]          PATIENT TYPE:  INP   LOCATION:  9167                          FACILITY:  [facility]   PHYSICIAN:  Janine Limbo, M.D.DATE OF BIRTH:  30-Oct-1965   DATE OF ADMISSION:  04/16/2005  DATE OF DISCHARGE:                                HISTORY & PHYSICAL   HISTORY OF PRESENT ILLNESS:  Ms. Kuroda is a 45 year old married white female,  gravida 5, para 2-0-2-2 at 40-6/7 weeks who presents with regular uterine  contractions since 5 p.m.  She reports small bloody show, denies leaking,  denies signs or symptoms of PIH.  No nausea and vomiting.  Her pregnancy has  been followed by the [facility] M.D. service and has been  remarkable for:  1.  AMA; 2.  History of macrosomia; 3.  Depression; 4.  Obesity; 5.  Group B Strep negative; 6.  Kidney stone; 7.  Recurrent UTI.   LABORATORY DATA:  Her prenatal labs were collected on September 13, 2004.  Hemoglobin was 14, hematocrit 42, platelets 201,000, blood type B positive,  antibody negative.  RPR nonreactive.  Rubella immune.  Hepatitis B surface  antigen negative.  Gonorrhea negative.  Chlamydia negative.  Pap smear was  within normal limits in February of 2006.  One hour Glucola from November 09, 2004 was 117.  Urine culture from December 07, 2004 was negative.  Urine  culture from December 23, 2004 was positive.  Glucola from January 12, 2005  was 108.  RPR at that time was nonreactive.  A 24-hour urine from January 14, 2005 had 225 mg of protein.  Urine culture from January 23, 2005 was  positive for Proteus mirabilis.  Urine culture from January 2007 was  positive again for Proteus mirabilis.  Fetal fibronectin from January 25, 2005 was negative.  Culture of the vaginal tract for group B Strep on  March 16, 2005 was negative.   HISTORY OF PRESENT PREGNANCY:  The patient presented for care at [facility] on September 13, 2004 at 10  weeks' gestation.  She was planning on  first trimester screening at [facility]: [facility] Rh[facility] W.  She declined amniocentesis.  Ultrasonography at 18 weeks' gestation confirms [facility] of April 10, 2005.  Normal anatomy was seen.  She declined amniocentesis.  First trimester  screening was normal.  She had early Glucola due to history of macrosomia.  That was normal.  She reported increasing signs of depression at 22 weeks'  gestation, but declined medications at that point.  She had a UTI at 22  weeks' gestation that was treated.  She was having increased signs and  symptoms of depression at 24 weeks' gestation and was started on Zoloft.  She also had a second positive urine culture at that point treated with  Bactrim DS.  At 26 weeks' gestation she was doing better with her depressive  signs and symptoms.  At 26-1/2 weeks she was having pain in her calf  bilaterally.  She had bilateral lower extremity Doppler studies at that  point and  they were negative.  She had a urine culture from January 07, 2005  that was positive for Proteus treated with Keflex.  She had a 24-hour urine  done as a baseline at that point that had 225 mg of protein in 24 hours.  She was seen for some bleeding at 29 weeks' gestation.  It was usually with  urination.  She had a second urine culture positive for Proteus on December  17.  She was treated with Augmentin.  If the hematuria was to continue after  the antibiotics she was supposed to be referred to a urologist.  A renal  ultrasound was done to rule out stones.  That was on December 20.  There was  a 1.9 cm right upper pole renal calculus, but no evidence of hydronephrosis.  She was referred to a urologist at that point.  She had a urine culture from  Dr. Logan Bores.  She was started on Macrodantin 50 mg daily at 30 weeks due to  the repeat UTIs.  She was to have a test of cure one week after her  antibiotic.  Urine culture at 31-1/2 weeks was positive again for Proteus.  She was  given Keflex.  The plan was to repeat her 24-hour urine when her  test of cure was negative.  At 32 weeks' gestation she was taking Darvocet  for her back pain and Keflex for persistent UTI.  She saw Dr. Logan Bores, the  urologist, on January 15, planning lithotripsy at postpartum.  The rest of  her prenatal care has been unremarkable.   OB HISTORY:  She is a gravida 5, para 2-0-2-2.  In 1997 she had a first  trimester SAB.  In 2001 she had a vaginal delivery of a female infant weighing  7 pounds and 5 ounces at 40-6/7 weeks' gestation after 10 hours of labor.  She had an epidural for anesthesia.  His name is Apolinar Junes.  She had an  appendectomy at 34 weeks.  That pregnancy was conceived with Clomid.  In  2003 she had a first trimester SAB.  In 2004 she had a vaginal delivery of a  female infant weighing 9 pounds 2 ounces at 41-1/7 weeks' gestation after a 7-  hour labor.  She had an epidural for anesthesia.  The infant's name was  [place].   PAST MEDICAL HISTORY:  1.  She experienced menarche at the age of 4 with 28-day cycles lasting 3-4      days.  2.  She had a laparoscopy in 1996 and 2000 for endometriosis.  3.  She has had a history of infertility and required Clomid to conceive her      first pregnancy.  4.  She reports having had the usual childhood illnesses.  5.  She has had IBS.  6.  She has had problems with interstitial cystitis.  7.  She has a history of depression.  8.  She fractured her right wrist and her clavicle.   ALLERGIES:  She has no medication allergies.   PAST SURGICAL HISTORY:  1.  Remarkable for laparoscopy x2 in 1996 and in 2000.  2.  Wisdom teeth extraction.   FAMILY HISTORY:  Remarkable for father with heart disease, maternal aunt  with chronic hypertension, father and sister with diabetes, mother with  thyroid dysfunction, mother with history of depression.   GENETIC HISTORY:  Remarkable for AMA.  SOCIAL HISTORY:  The patient is married to the father of  the baby.  His name  is Tasia Catchings.  They are of the [employer].  The patient has four years of  college education.  The father of the baby has two years of college  education and is employed as a Production designer, theatre/television/film.  They deny any alcohol, tobacco, or  illicit drug use with this pregnancy.   PHYSICAL EXAMINATION:  VITAL SIGNS:  Blood pressure is 122/74.  Other vital  signs are stable.  She is afebrile.  HEENT:  Grossly within normal limits.  CHEST:  Clear to auscultation.  HEART:  Regular rate and rhythm.  ABDOMEN:  Gravid in contour with the fundal height extending approximately  40 cm above the pubic symphysis.  Fetal heart rate is reactive and  reassuring.  Contractions are every 2-4 minutes.  CERVIX:  Cervix is 4-5 cm, 70%, vertex, 0, with intact bag of waters.  EXTREMITIES:  Normal.   ASSESSMENT:  1.  Intrauterine pregnancy at term.  2.  Early active labor.   PLAN:  1.  Admit to birthing suites.  Dr. Stefano Gaul is aware.  2.  Routine M.D. orders.  3.  Patient plans epidural for labor.  4.  Anticipate normal spontaneous vagina birth.      Cam Hai, C.N.M.      Janine Limbo, M.D.  Electronically Signed    KS/MEDQ  D:  04/16/2005  T:  04/17/2005  Job:  [id number]

## 2010-06-25 NOTE — Discharge Summary (Signed)
[facility]  Patient:    [name], [name]                      MRN: [id number] Adm. Date:  [id number] Disc. Date: [id number] Attending:  Cleatrice Burke Dictator:   Miguel Dibble, C.N.M.                           Discharge Summary  ADMISSION DIAGNOSES:          Intrauterine pregnancy at 34-3/7 weeks with right lower quadrant pain and elevated white blood count, appendicitis.  DISCHARGE DIAGNOSES:          Recovered satisfactorily from appendectomy.  PROCEDURES:                               1. A CT scan of the abdomen.                               2. Acute appendectomy.                               3. Electronic fetal monitoring.  COURSE OF HOSPITALIZATION:    On November 10, at approximately 6:10 in the evening, Huda Petrey, a 45 year old gravida 2, para 0-0-1-0 at 34-3/7 weeks presented with severe right lower quadrant abdominal pain and an elevated white blood count of 20.8.  Her hemoglobin was 12.5.  Platelets 284.  She reported good fetal movement.  No leaking or bleeding.  She had a history of irritable bowel syndrome symptoms, but was not officially diagnosed.  She was not having any regular contractions or vaginal bleeding.  She denied any fever.  She expressed tenderness on the right when the left side of the abdomen was palpated.  By 5784, she continued to have severe increasing right lower quadrant pain.  She was evaluated by Dr. Cathey Endow and diagnosis of acute appendicitis was made.  She proceeded to have an appendectomy, uncomplicated, under general anesthesia.  On the eleventh, on her first postoperative day, she was recovering satisfactorily with O2 saturation at 95%.  The incision was healing satisfactorily and she has appropriate incisional pain.  She was tolerating a clear liquid diet.  On postoperative day #1, on the eleventh, her hemoglobin had dropped to 11.3, hematocrit 33.1, white count had decreased slightly to 18,  platelets 253.  She was started on magnesium phosphate therapy for uterine irritability.  Her intake and output was 3490/1675 over the previous night.  She started to diurese with an improvement during the morning of the eleventh.  On postoperative day #2, on the twelfth of November, she was tolerating clear liquids.  She had some incisional pain, but was appropriate. Her vital signs were stable.  Her Foley was discontinued.  On postoperative day #3, the thirteenth, she had not passed flatus postoperatively as of yet. She was given a Dulcolax suppository which gave fair results.  On postoperative day #3, November 13, she continues to have some uterine irritability.  Motrin appeared to help; however, it was poorly tolerated.  By November 14, she was passing flatus, tolerating her diet.  She was nauseated early in the morning and it is probable that this was related to her intake of Motrin and Vicodin on an empty stomach.  Her Phenergan was effective in controlling her nausea.  She had no further uterine irritability or activity. Her incision was clean, dry, and intact.  She had good bowel sounds.  Fetal heart rate continued to be reactive and reassuring without any uterine activity.  She was discharged home in stable condition after deeming to have received the full benefit of her hospitalization.  DISCHARGE INSTRUCTIONS:  Postoperative instructions, antepartum instructions for danger signs and kick counts.  DISCHARGE MEDICATIONS: 1. Motrin 600 mg q.6h. to be taken with a meal. 2. Tylox 1-2 p.o. q.4-6h. p.r.n. for pain. 3. Continue prenatal vitamins. 4. Phenergan 25 mg tablets p.o. 1 q.6h. p.r.n. for nausea.  DISCHARGE FOLLOW-UP:  November 19, at [facility].  Discharge follow-up in two weeks with Dr. Cathey Endow for surgical follow-up. DD:  12/22/99 TD:  12/22/99 Job: [id number]

## 2010-06-25 NOTE — H&P (Signed)
[facility]  Patient:    [name], [name]                      MRN: [id number] Adm. Date:  [id number] Disc. Date: [id number] Attending:  Cleatrice Burke Dictator:   Vance Gather Duplantis, C.N.M.                         History and Physical  HISTORY:                      Ms. Lehn is a 45 year old married white female, gravida 2, para 0, 0, 1, 0, at 40-5/7th weeks, who presents complaining of severe pain from uterine contractions since about 5 a.m. this morning.  She reports that the contractions have been every three to five minutes since that time.  She denies any leaking or vaginal bleeding.  She reports positive fetal movement.  She has been in the maternity admissions unit throughout the morning of this date for an evaluation, and after more than one hour of labor observation, she had not changed her cervix significantly, and received therapeutic rest, with 15 mg of morphine IM and 25 mg of Phenergan IM.  She is now awake from that and continues to contract mildly, and has still no leaking or vaginal bleeding.  Her pregnancy has been followed at [facility] by the M.D. Service, and has been at risk for a history of infertility, and history of depression, history of migraines, and a history of interstitial cystitis, and an appendectomy in November 2001, during this pregnancy.  OB HISTORY:                   She is a gravida 2, para 0, 0, 1, 0, with a possible miscarriage in 63, though she is not sure if she truly had a miscarriage at that time.  She did receive Clomid for this conception.  ALLERGIES:                    No known drug allergies.  PAST MEDICAL HISTORY:         History of the usual childhood diseases. History of irritable bowel syndrome.  Possible interstitial cystitis.  She also reports a history of depression and a history of migraines.  PAST SURGICAL HISTORY:        Significant for an appendectomy during this pregnancy  in November 2001.  FAMILY HISTORY:               Significant for father with an angioplasty. Maternal aunt with chronic hypertension.  Father and sister with asthma. Maternal aunt, father, and sister with diabetes mellitus.  Mother with thyroid disease.  Father with kidney problems.  GENETIC HISTORY:              Essentially the father of the baby has a hearing loss, possibly from birth, possibly a result of medication for tuberculosis.  SOCIAL HISTORY:               She is married to [employer], who is involved and supportive.  They are both employed full-time.  They are of the [employer].  They deny any illicit drug use, alcohol, or smoking, with this pregnancy.  PRENATAL LABORATORY DATA:     Blood type B-positive.  Her antibody screen is negative.  Syphilis is nonreactive.  Rubella immune.  Hepatitis-C surface antigen negative.  HIV  is nonreactive.  Pap is within normal limits.  Her glucola is within normal limits.  Maternal serum alpha fetoprotein is within normal limits.  Her 36-week beta Streptococcus was negative.  PHYSICAL EXAMINATION:  VITAL SIGNS:                  Stable.  She is afebrile.  HEENT:                        Grossly within normal limits.  HEART:                        A regular rhythm and rate.  CHEST:                        Clear.  BREASTS:                      Soft, nontender.  ABDOMEN:                      Gravid with uterine contractions that are irregular and mild.  Fetal heart reactive and reassuring.  PELVIC:                       Cervix examination after a therapeutic rest is now 2.0 cm, 90%, vertex, -1 station, with intact membranes, and a small amount of bloody show noted.  EXTREMITIES:                  Within normal limits.  ASSESSMENT:                   1. Intrauterine pregnancy at term.                               2. Early labor.                               3. Negative Group-B Streptococcus.  PLAN:                         Per  Dr. Erie Noe P. Haygood is to admit to labor and delivery, and anticipate a normal spontaneous vaginal delivery. DD:  01/29/00 TD:  01/29/00 Job: [id number]

## 2010-08-24 ENCOUNTER — Telehealth: Payer: Self-pay | Admitting: *Deleted

## 2010-08-24 NOTE — Telephone Encounter (Signed)
appt scheduled for 7/20 at 9:15

## 2010-08-24 NOTE — Telephone Encounter (Signed)
ROV Thursday or Friday

## 2010-08-24 NOTE — Telephone Encounter (Signed)
Pt had a migraine yesterday and would like to know if Dr Kirtland Bouchard would like to see her or if he wants to refer to a H/A specialist

## 2010-08-27 ENCOUNTER — Encounter: Payer: Self-pay | Admitting: Internal Medicine

## 2010-08-27 ENCOUNTER — Ambulatory Visit (INDEPENDENT_AMBULATORY_CARE_PROVIDER_SITE_OTHER): Payer: BC Managed Care – PPO | Admitting: Internal Medicine

## 2010-08-27 DIAGNOSIS — F329 Major depressive disorder, single episode, unspecified: Secondary | ICD-10-CM

## 2010-08-27 DIAGNOSIS — R51 Headache: Secondary | ICD-10-CM

## 2010-08-27 MED ORDER — DESVENLAFAXINE SUCCINATE ER 100 MG PO TB24: 100.0000 mg | ORAL_TABLET | Freq: Every day | ORAL | Status: DC

## 2010-08-27 MED ORDER — RIZATRIPTAN BENZOATE 10 MG PO TABS: 10.0000 mg | ORAL_TABLET | ORAL | Status: DC | PRN

## 2010-08-27 NOTE — Progress Notes (Signed)
  Subjective:    Patient ID: [name], female    DOB: [birth year], 45 y.o.   MRN: [id number]  HPI   45 year old patient who has a history of migraine headaches. For the past 4 months, she has had worsening headaches  mid month.  She states for the past 4 months on or about the 16th of each months she has had frequent headaches the last 2 or 3 days. They are relieved by Maxalt if she takes the medication early enough in the headache cycle. She describes increase in stress. She does have a history of depression which has been treated with Prestiq. She also describes milder muscle contraction type headaches that are relieved by ibuprofen.   Review of Systems  Neurological: Positive for headaches.  Psychiatric/Behavioral: The patient is nervous/anxious.        Objective:   Physical Exam  Constitutional: She is oriented to person, place, and time. She appears well-developed and well-nourished.  HENT:  Head: Normocephalic.  Right Ear: External ear normal.  Left Ear: External ear normal.  Mouth/Throat: Oropharynx is clear and moist.  Eyes: Conjunctivae and EOM are normal. Pupils are equal, round, and reactive to light.  Neck: Normal range of motion. Neck supple. No thyromegaly present.  Cardiovascular: Normal rate, regular rhythm, normal heart sounds and intact distal pulses.   Pulmonary/Chest: Effort normal and breath sounds normal.  Abdominal: Soft. Bowel sounds are normal. She exhibits no mass. There is no tenderness.  Musculoskeletal: Normal range of motion.  Lymphadenopathy:    She has no cervical adenopathy.  Neurological: She is alert and oriented to person, place, and time.  Skin: Skin is warm and dry. No rash noted.  Psychiatric: She has a normal mood and affect. Her behavior is normal.          Assessment & Plan:   Migraine headaches. These are fairly close together in the mid month and well controlled with Maxalt. Continue her present regimen. If headaches become more  frequent or severe will consider a prophylactic regimen with Topamax. She has a hormone eluting IUD in place for a number of years Anxiety depression. We'll continue Pristiq Return here when necessary or in 4 months for followup

## 2010-08-27 NOTE — Patient Instructions (Signed)
It is important that you exercise regularly, at least 20 minutes 3 to 4 times per week.  If you develop chest pain or shortness of breath seek  medical attention.  Call or return to clinic prn if these symptoms worsen or fail to improve as anticipated.  

## 2010-09-10 ENCOUNTER — Telehealth: Payer: Self-pay | Admitting: Internal Medicine

## 2010-09-10 MED ORDER — RIZATRIPTAN BENZOATE 10 MG PO TABS: 10.0000 mg | ORAL_TABLET | ORAL | Status: DC | PRN

## 2010-09-10 NOTE — Telephone Encounter (Signed)
Refill maxalt to [facility] on [address]. Needs asap today. Thanks.

## 2010-09-20 ENCOUNTER — Telehealth: Payer: Self-pay | Admitting: Internal Medicine

## 2010-09-20 MED ORDER — TOPIRAMATE 25 MG PO TABS: ORAL_TABLET | ORAL | Status: DC

## 2010-09-20 NOTE — Telephone Encounter (Signed)
Spoke with pt - informed of new med and how to take, also needs rov in 2 wks to f/u on headaches and med. rx efiled to [facility]. kik

## 2010-09-20 NOTE — Telephone Encounter (Signed)
Pt has a migraine on Thursday Friday and Saturday . Pt said she is getting migraines more frequently. Pt wants to know the options that she has because her current medication is hard to get refilled.please contact pt.

## 2010-09-20 NOTE — Telephone Encounter (Signed)
Please call in a prescription for generic Topamax. 25 mg #50. Instruct patient to take 25 mg at bedtime for one week and then 25 mg twice daily. Schedule office visit in 2 weeks

## 2010-10-04 ENCOUNTER — Ambulatory Visit (INDEPENDENT_AMBULATORY_CARE_PROVIDER_SITE_OTHER): Payer: BC Managed Care – PPO | Admitting: Internal Medicine

## 2010-10-04 ENCOUNTER — Encounter: Payer: Self-pay | Admitting: Internal Medicine

## 2010-10-04 DIAGNOSIS — J309 Allergic rhinitis, unspecified: Secondary | ICD-10-CM

## 2010-10-04 DIAGNOSIS — R51 Headache: Secondary | ICD-10-CM

## 2010-10-04 MED ORDER — TOPIRAMATE 25 MG PO TABS: ORAL_TABLET | ORAL | Status: DC

## 2010-10-04 NOTE — Patient Instructions (Signed)
It is important that you exercise regularly, at least 20 minutes 3 to 4 times per week.  If you develop chest pain or shortness of breath seek  medical attention.  You need to lose weight.  Consider a lower calorie diet and regular exercise.  Return in 3 months for follow-up  

## 2010-10-04 NOTE — Progress Notes (Signed)
  Subjective:    Patient ID: [name], female    DOB: November 10, 1965, 45 y.o.   MRN: [id number]  HPI  45 year old patient who is seen today for followup of her migraine headaches. She presently is on Topamax 25 mg twice daily she has been on this for a few weeks and now is headache free.  She has some symptoms of allergic rhinitis but otherwise done quite well. She is quite pleased with her response was Topamax    Review of Systems  Constitutional: Negative.   HENT: Positive for congestion, rhinorrhea and postnasal drip. Negative for hearing loss, sore throat, dental problem, sinus pressure and tinnitus.   Eyes: Negative for pain, discharge and visual disturbance.  Respiratory: Negative for cough and shortness of breath.   Cardiovascular: Negative for chest pain, palpitations and leg swelling.  Gastrointestinal: Negative for nausea, vomiting, abdominal pain, diarrhea, constipation, blood in stool and abdominal distention.  Genitourinary: Negative for dysuria, urgency, frequency, hematuria, flank pain, vaginal bleeding, vaginal discharge, difficulty urinating, vaginal pain and pelvic pain.  Musculoskeletal: Negative for joint swelling, arthralgias and gait problem.  Skin: Negative for rash.  Neurological: Positive for headaches. Negative for dizziness, syncope, speech difficulty, weakness and numbness.  Hematological: Negative for adenopathy.  Psychiatric/Behavioral: Negative for behavioral problems, dysphoric mood and agitation. The patient is not nervous/anxious.        Objective:   Physical Exam  Constitutional: She appears well-developed and well-nourished. No distress.          Assessment & Plan:   Migraine headaches. Now on prophylactic Topamax at modest 25 mg twice a day dose. She has been symptom free for some time since starting the twice a day regimen. We'll clinically observe on this dose and consider dose titration upward if headaches become a problem. Will recheck in 3  months

## 2010-10-28 ENCOUNTER — Other Ambulatory Visit: Payer: Self-pay | Admitting: Obstetrics and Gynecology

## 2010-10-28 DIAGNOSIS — Z1231 Encounter for screening mammogram for malignant neoplasm of breast: Secondary | ICD-10-CM

## 2010-10-28 LAB — HM PAP SMEAR: HM Pap smear: NORMAL

## 2010-11-02 ENCOUNTER — Telehealth: Payer: Self-pay | Admitting: *Deleted

## 2010-11-02 NOTE — Telephone Encounter (Signed)
Pt is going to run out of Topamax on Oct. 13 and will not be able to afford to get it refilled.  Is asking what she should do as far as stopping it???  Does she need to taper?

## 2010-11-02 NOTE — Telephone Encounter (Signed)
Yes should taper;  Take 1/2 the dose for 2 weeks, then decrease  By another 1/2 for 2 wks then d/c

## 2010-11-03 NOTE — Telephone Encounter (Signed)
Spoke with husband - ask him to tell her to call to discuss dr. Harless Litten instructions

## 2010-11-04 NOTE — Telephone Encounter (Signed)
Spoke with pt- informed of taper

## 2010-11-04 NOTE — Telephone Encounter (Signed)
Pt is returning call. Pls call back asap at work # [phone].

## 2010-11-09 ENCOUNTER — Other Ambulatory Visit: Payer: Self-pay

## 2010-11-09 MED ORDER — TOPIRAMATE 25 MG PO TABS: ORAL_TABLET | ORAL | Status: DC

## 2010-11-09 NOTE — Telephone Encounter (Signed)
Faxed back to [facility] - pt indicates she has found help with medication cost. kik

## 2010-12-01 ENCOUNTER — Telehealth: Payer: Self-pay | Admitting: *Deleted

## 2010-12-01 NOTE — Telephone Encounter (Signed)
Pt called back --informed dr Kirtland Bouchard off on wed pm- may want to try otc bonine for dizziness and dr Kirtland Bouchard will answer message in am

## 2010-12-01 NOTE — Telephone Encounter (Signed)
Pt called c/o dizziness and severe nausea for 2 days. No other sx.  Cant drive here due to vertigo- requesting medication being called in for vertigo [facility]

## 2010-12-02 MED ORDER — MECLIZINE HCL 25 MG PO TABS: 25.0000 mg | ORAL_TABLET | Freq: Three times a day (TID) | ORAL | Status: DC | PRN

## 2010-12-02 NOTE — Telephone Encounter (Signed)
Meclizine 25  #30  RF 3 one every 4-6 hrs as needed

## 2010-12-02 NOTE — Telephone Encounter (Signed)
Pt aware rx sent.  

## 2010-12-02 NOTE — Telephone Encounter (Signed)
[name], I will send this to you.  I dont know who will be doing triage today

## 2010-12-09 ENCOUNTER — Telehealth: Payer: Self-pay | Admitting: Internal Medicine

## 2010-12-09 DIAGNOSIS — F329 Major depressive disorder, single episode, unspecified: Secondary | ICD-10-CM

## 2010-12-09 MED ORDER — DESVENLAFAXINE SUCCINATE ER 100 MG PO TB24: 100.0000 mg | ORAL_TABLET | Freq: Every day | ORAL | Status: DC

## 2010-12-09 NOTE — Telephone Encounter (Signed)
Spoke with pt - samples avilb. - up front for husband to pick up - no pristque samples. Pt is now set up for [facility] - rx sent. KIK

## 2010-12-09 NOTE — Telephone Encounter (Signed)
Pt had migraine in her rt eye last night. Pt couldn't afford to get her script filled for Maxalt. Pt took Topamax and Antivert. Migraine is over both of pts eyes and she is having twitches in eye. Pt is req a call back from nurse asap.

## 2010-12-20 ENCOUNTER — Ambulatory Visit
Admission: RE | Admit: 2010-12-20 | Discharge: 2010-12-20 | Disposition: A | Discharge status: Home / Self Care | Payer: BC Managed Care – PPO | Source: Ambulatory Visit | Attending: Obstetrics and Gynecology | Admitting: Obstetrics and Gynecology

## 2010-12-20 DIAGNOSIS — Z1231 Encounter for screening mammogram for malignant neoplasm of breast: Secondary | ICD-10-CM

## 2010-12-28 ENCOUNTER — Other Ambulatory Visit: Payer: Self-pay | Admitting: Obstetrics and Gynecology

## 2010-12-28 DIAGNOSIS — R928 Other abnormal and inconclusive findings on diagnostic imaging of breast: Secondary | ICD-10-CM

## 2011-01-03 ENCOUNTER — Ambulatory Visit: Payer: BC Managed Care – PPO | Admitting: Internal Medicine

## 2011-01-06 ENCOUNTER — Ambulatory Visit (INDEPENDENT_AMBULATORY_CARE_PROVIDER_SITE_OTHER): Payer: BC Managed Care – PPO | Admitting: Internal Medicine

## 2011-01-06 ENCOUNTER — Encounter: Payer: Self-pay | Admitting: Internal Medicine

## 2011-01-06 DIAGNOSIS — F329 Major depressive disorder, single episode, unspecified: Secondary | ICD-10-CM

## 2011-01-06 DIAGNOSIS — R51 Headache: Secondary | ICD-10-CM

## 2011-01-06 MED ORDER — TOPIRAMATE 50 MG PO TABS: 50.0000 mg | ORAL_TABLET | Freq: Two times a day (BID) | ORAL | Status: DC

## 2011-01-06 NOTE — Progress Notes (Signed)
  Subjective:    Patient ID: [name], female    DOB: April 14, 1965, 45 y.o.   MRN: [id number]  HPI 45 year old patient who is seen today for followup of her migraine headaches. She was seen here 3 months ago and doing quite well on Topamax 25 mg twice a day. She had been headache free for some time. Further dose titration was not initiated due to her stable clinical status. Presently she complains a one severe migraine per month requiring Maxalt for relief. She also has 2 or 3 minor headaches per month controlled with ibuprofen. She has noted some mild weight loss with the Topamax.   Review of Systems  Constitutional: Negative.   HENT: Negative for hearing loss, congestion, sore throat, rhinorrhea, dental problem, sinus pressure and tinnitus.   Eyes: Negative for pain, discharge and visual disturbance.  Respiratory: Negative for cough and shortness of breath.   Cardiovascular: Negative for chest pain, palpitations and leg swelling.  Gastrointestinal: Negative for nausea, vomiting, abdominal pain, diarrhea, constipation, blood in stool and abdominal distention.  Genitourinary: Negative for dysuria, urgency, frequency, hematuria, flank pain, vaginal bleeding, vaginal discharge, difficulty urinating, vaginal pain and pelvic pain.  Musculoskeletal: Negative for joint swelling, arthralgias and gait problem.  Skin: Negative for rash.  Neurological: Positive for headaches. Negative for dizziness, syncope, speech difficulty, weakness and numbness.  Hematological: Negative for adenopathy.  Psychiatric/Behavioral: Negative for behavioral problems, dysphoric mood and agitation. The patient is not nervous/anxious.        Objective:   Physical Exam  Constitutional: She appears well-developed and well-nourished. No distress.  Psychiatric: She has a normal mood and affect. Her behavior is normal.          Assessment & Plan:   Migraine headaches improved.  We'll uptitrate the Topamax to 50 mg  twice a day. Samples of Maxalt dispensed. Will recheck in 3 months

## 2011-01-06 NOTE — Patient Instructions (Signed)
It is important that you exercise regularly, at least 20 minutes 3 to 4 times per week.  If you develop chest pain or shortness of breath seek  medical attention.  You need to lose weight.  Consider a lower calorie diet and regular exercise. 

## 2011-01-12 ENCOUNTER — Ambulatory Visit
Admission: RE | Admit: 2011-01-12 | Discharge: 2011-01-12 | Disposition: A | Discharge status: Home / Self Care | Payer: BC Managed Care – PPO | Source: Ambulatory Visit | Attending: Obstetrics and Gynecology | Admitting: Obstetrics and Gynecology

## 2011-01-12 DIAGNOSIS — R928 Other abnormal and inconclusive findings on diagnostic imaging of breast: Secondary | ICD-10-CM

## 2011-02-11 ENCOUNTER — Telehealth: Payer: Self-pay | Admitting: Internal Medicine

## 2011-02-11 MED ORDER — TOPIRAMATE 50 MG PO TABS: 50.0000 mg | ORAL_TABLET | Freq: Two times a day (BID) | ORAL | Status: DC

## 2011-02-11 NOTE — Telephone Encounter (Signed)
rx sent

## 2011-02-11 NOTE — Telephone Encounter (Signed)
Please send new rx for topamax to [facility].

## 2011-03-16 ENCOUNTER — Encounter: Payer: Self-pay | Admitting: Internal Medicine

## 2011-03-16 ENCOUNTER — Ambulatory Visit (INDEPENDENT_AMBULATORY_CARE_PROVIDER_SITE_OTHER): Payer: BC Managed Care – PPO | Admitting: Internal Medicine

## 2011-03-16 DIAGNOSIS — F329 Major depressive disorder, single episode, unspecified: Secondary | ICD-10-CM

## 2011-03-16 DIAGNOSIS — J069 Acute upper respiratory infection, unspecified: Secondary | ICD-10-CM

## 2011-03-16 NOTE — Progress Notes (Signed)
  Subjective:    Patient ID: [name], female    DOB: Jan 30, 1966, 46 y.o.   MRN: [id number]  HPI  46 year old patient who is seen today for followup. She has a history depression and presents today with a chief complaint of sore throat cough and a chief complaint of hoarseness of 5 days duration. She has a young daughter who has been ill. Her depression has done well on current therapy    Review of Systems  Constitutional: Positive for fatigue.  HENT: Positive for sore throat and voice change. Negative for hearing loss, congestion, rhinorrhea, dental problem, sinus pressure and tinnitus.   Eyes: Negative for pain, discharge and visual disturbance.  Respiratory: Positive for cough and choking. Negative for shortness of breath.   Cardiovascular: Negative for chest pain, palpitations and leg swelling.  Gastrointestinal: Negative for nausea, vomiting, abdominal pain, diarrhea, constipation, blood in stool and abdominal distention.  Genitourinary: Negative for dysuria, urgency, frequency, hematuria, flank pain, vaginal bleeding, vaginal discharge, difficulty urinating, vaginal pain and pelvic pain.  Musculoskeletal: Negative for joint swelling, arthralgias and gait problem.  Skin: Negative for rash.  Neurological: Negative for dizziness, syncope, speech difficulty, weakness, numbness and headaches.  Hematological: Negative for adenopathy.  Psychiatric/Behavioral: Negative for behavioral problems, dysphoric mood and agitation. The patient is not nervous/anxious.        Objective:   Physical Exam  Constitutional: She is oriented to person, place, and time. She appears well-developed and well-nourished.       Significant hoarseness but in no acute distress. Afebrile  HENT:  Head: Normocephalic.  Right Ear: External ear normal.  Left Ear: External ear normal.  Mouth/Throat: Oropharynx is clear and moist.  Eyes: Conjunctivae and EOM are normal. Pupils are equal, round, and reactive to  light.  Neck: Normal range of motion. Neck supple. No thyromegaly present.  Cardiovascular: Normal rate, regular rhythm, normal heart sounds and intact distal pulses.   Pulmonary/Chest: Effort normal and breath sounds normal.  Abdominal: Soft. Bowel sounds are normal. She exhibits no mass. There is no tenderness.  Musculoskeletal: Normal range of motion.  Lymphadenopathy:    She has no cervical adenopathy.  Neurological: She is alert and oriented to person, place, and time.  Skin: Skin is warm and dry. No rash noted.  Psychiatric: She has a normal mood and affect. Her behavior is normal.          Assessment & Plan:    Viral URI with hoarseness. The patient has minimal cough will basically treat with anti-inflammatories and observed. Voice rest encouraged and

## 2011-03-16 NOTE — Patient Instructions (Signed)
Get plenty of rest, Drink lots of  clear liquids, and use Tylenol or ibuprofen for fever and discomfort.    

## 2011-04-07 ENCOUNTER — Ambulatory Visit: Payer: BC Managed Care – PPO | Admitting: Internal Medicine

## 2011-04-08 ENCOUNTER — Ambulatory Visit: Payer: BC Managed Care – PPO | Admitting: Internal Medicine

## 2011-04-11 ENCOUNTER — Encounter: Payer: Self-pay | Admitting: Internal Medicine

## 2011-04-11 ENCOUNTER — Ambulatory Visit (INDEPENDENT_AMBULATORY_CARE_PROVIDER_SITE_OTHER): Payer: BC Managed Care – PPO | Admitting: Internal Medicine

## 2011-04-11 DIAGNOSIS — J069 Acute upper respiratory infection, unspecified: Secondary | ICD-10-CM

## 2011-04-11 DIAGNOSIS — F329 Major depressive disorder, single episode, unspecified: Secondary | ICD-10-CM

## 2011-04-11 DIAGNOSIS — F3289 Other specified depressive episodes: Secondary | ICD-10-CM

## 2011-04-11 DIAGNOSIS — F32A Depression, unspecified: Secondary | ICD-10-CM

## 2011-04-11 DIAGNOSIS — R51 Headache: Secondary | ICD-10-CM

## 2011-04-11 MED ORDER — FUROSEMIDE 40 MG PO TABS: 40.0000 mg | ORAL_TABLET | ORAL | Status: DC | PRN

## 2011-04-11 MED ORDER — RIZATRIPTAN BENZOATE 10 MG PO TABS: 10.0000 mg | ORAL_TABLET | ORAL | Status: DC | PRN

## 2011-04-11 MED ORDER — DESVENLAFAXINE SUCCINATE ER 100 MG PO TB24: 100.0000 mg | ORAL_TABLET | Freq: Every day | ORAL | Status: DC

## 2011-04-11 MED ORDER — HYDROCODONE-HOMATROPINE 5-1.5 MG/5ML PO SYRP: 5.0000 mL | ORAL_SOLUTION | Freq: Four times a day (QID) | ORAL | Status: DC | PRN

## 2011-04-11 NOTE — Patient Instructions (Signed)
Get plenty of rest, Drink lots of  clear liquids, and use Tylenol or ibuprofen for fever and discomfort.      It is important that you exercise regularly, at least 20 minutes 3 to 4 times per week.  If you develop chest pain or shortness of breath seek  medical attention.  Return in 6 months for follow-up  

## 2011-04-11 NOTE — Progress Notes (Signed)
  Subjective:    Patient ID: [name], female    DOB: 05/13/1965, 46 y.o.   MRN: [id number]  HPI  46 year old patient who is in today for follow up. She has a history of migraine headaches that have been fairly well-controlled. She generally has done well but over the weekend has had hoarseness sore throat chest congestion and cough. There has been no fever. No wheezing or shortness of breath. Her migraine syndrome has been fairly stable but continues to have occasional headaches.    Review of Systems  Constitutional: Negative.   HENT: Positive for congestion, rhinorrhea and postnasal drip. Negative for hearing loss, sore throat, dental problem, sinus pressure and tinnitus.   Eyes: Negative for pain, discharge and visual disturbance.  Respiratory: Positive for cough. Negative for shortness of breath.   Cardiovascular: Negative for chest pain, palpitations and leg swelling.  Gastrointestinal: Negative for nausea, vomiting, abdominal pain, diarrhea, constipation, blood in stool and abdominal distention.  Genitourinary: Negative for dysuria, urgency, frequency, hematuria, flank pain, vaginal bleeding, vaginal discharge, difficulty urinating, vaginal pain and pelvic pain.  Musculoskeletal: Negative for joint swelling, arthralgias and gait problem.  Skin: Negative for rash.  Neurological: Positive for headaches. Negative for dizziness, syncope, speech difficulty, weakness and numbness.  Hematological: Negative for adenopathy.  Psychiatric/Behavioral: Negative for behavioral problems, dysphoric mood and agitation. The patient is not nervous/anxious.        Objective:   Physical Exam  Constitutional: She is oriented to person, place, and time. She appears well-developed and well-nourished.       Afebrile Blood pressure normal Hoarse   HENT:  Head: Normocephalic.  Right Ear: External ear normal.  Left Ear: External ear normal.       Oropharynx minimally erythematous  Eyes:  Conjunctivae and EOM are normal. Pupils are equal, round, and reactive to light.  Neck: Normal range of motion. Neck supple. No thyromegaly present.  Cardiovascular: Normal rate, regular rhythm, normal heart sounds and intact distal pulses.   Pulmonary/Chest: Effort normal and breath sounds normal. No respiratory distress. She has no wheezes. She has no rales.  Abdominal: Soft. Bowel sounds are normal. She exhibits no mass. There is no tenderness.  Musculoskeletal: Normal range of motion.  Lymphadenopathy:    She has no cervical adenopathy.  Neurological: She is alert and oriented to person, place, and time.  Skin: Skin is warm and dry. No rash noted.  Psychiatric: She has a normal mood and affect. Her behavior is normal.          Assessment & Plan:   viral URI. Will treat symptomatically Migraine headaches fairly stable Depression controlled  Recheck 6 months

## 2011-04-19 ENCOUNTER — Ambulatory Visit (INDEPENDENT_AMBULATORY_CARE_PROVIDER_SITE_OTHER): Payer: BC Managed Care – PPO | Admitting: Internal Medicine

## 2011-04-19 ENCOUNTER — Encounter: Payer: Self-pay | Admitting: Internal Medicine

## 2011-04-19 DIAGNOSIS — J309 Allergic rhinitis, unspecified: Secondary | ICD-10-CM

## 2011-04-19 DIAGNOSIS — J069 Acute upper respiratory infection, unspecified: Secondary | ICD-10-CM

## 2011-04-19 DIAGNOSIS — J029 Acute pharyngitis, unspecified: Secondary | ICD-10-CM

## 2011-04-19 MED ORDER — HYDROCODONE-HOMATROPINE 5-1.5 MG/5ML PO SYRP: 5.0000 mL | ORAL_SOLUTION | Freq: Four times a day (QID) | ORAL | Status: AC | PRN

## 2011-04-19 MED ORDER — DOXYCYCLINE HYCLATE 100 MG PO TABS: 100.0000 mg | ORAL_TABLET | Freq: Two times a day (BID) | ORAL | Status: AC

## 2011-04-19 NOTE — Patient Instructions (Signed)
Get plenty of rest, Drink lots of  clear liquids, and use Tylenol or ibuprofen for fever and discomfort.    Call or return to clinic prn if these symptoms worsen or fail to improve as anticipated.  

## 2011-04-19 NOTE — Progress Notes (Signed)
  Subjective:    Patient ID: [name], female    DOB: [birth year], 46 y.o.   MRN: [id number]  HPI 46 year old patient who is seen today in followup. She was seen here 11 days ago with URI symptoms and treated symptomatically for her cough. For the past several days she's developed worsening chest congestion with productive cough now yielding green sputum. There's been no documented fever . No wheezing or shortness of breath no further chest pain    Review of Systems  Constitutional: Positive for appetite change and fatigue.  HENT: Positive for congestion. Negative for hearing loss, sore throat, rhinorrhea, dental problem, sinus pressure and tinnitus.   Eyes: Negative for pain, discharge and visual disturbance.  Respiratory: Positive for cough. Negative for shortness of breath.   Cardiovascular: Negative for chest pain, palpitations and leg swelling.  Gastrointestinal: Negative for nausea, vomiting, abdominal pain, diarrhea, constipation, blood in stool and abdominal distention.  Genitourinary: Negative for dysuria, urgency, frequency, hematuria, flank pain, vaginal bleeding, vaginal discharge, difficulty urinating, vaginal pain and pelvic pain.  Musculoskeletal: Negative for joint swelling, arthralgias and gait problem.  Skin: Negative for rash.  Neurological: Negative for dizziness, syncope, speech difficulty, weakness, numbness and headaches.  Hematological: Negative for adenopathy.  Psychiatric/Behavioral: Negative for behavioral problems, dysphoric mood and agitation. The patient is not nervous/anxious.        Objective:   Physical Exam  Constitutional: She is oriented to person, place, and time. She appears well-developed and well-nourished.  HENT:  Head: Normocephalic.  Right Ear: External ear normal.  Left Ear: External ear normal.  Mouth/Throat: Oropharynx is clear and moist.       Oropharynx now appears normal  Eyes: Conjunctivae and EOM are normal. Pupils are equal,  round, and reactive to light.  Neck: Normal range of motion. Neck supple. No thyromegaly present.  Cardiovascular: Normal rate, regular rhythm, normal heart sounds and intact distal pulses.   Pulmonary/Chest: Effort normal and breath sounds normal.  Abdominal: Soft. Bowel sounds are normal. She exhibits no mass. There is no tenderness.  Musculoskeletal: Normal range of motion.  Lymphadenopathy:    She has no cervical adenopathy.  Neurological: She is alert and oriented to person, place, and time.  Skin: Skin is warm and dry. No rash noted.  Psychiatric: She has a normal mood and affect. Her behavior is normal.          Assessment & Plan:  URI/bronchitis. We'll continue symptomatic treatment Will also add doxycycline 100 mg twice a day for 7 days

## 2011-05-01 ENCOUNTER — Emergency Department (HOSPITAL_COMMUNITY)
Admission: EM | Admit: 2011-05-01 | Discharge: 2011-05-01 | Disposition: A | Discharge status: Home / Self Care | Payer: BC Managed Care – PPO | Attending: Emergency Medicine | Admitting: Emergency Medicine

## 2011-05-01 ENCOUNTER — Encounter (HOSPITAL_COMMUNITY): Payer: Self-pay | Admitting: *Deleted

## 2011-05-01 DIAGNOSIS — G43909 Migraine, unspecified, not intractable, without status migrainosus: Secondary | ICD-10-CM

## 2011-05-01 DIAGNOSIS — R21 Rash and other nonspecific skin eruption: Secondary | ICD-10-CM | POA: Insufficient documentation

## 2011-05-01 DIAGNOSIS — R112 Nausea with vomiting, unspecified: Secondary | ICD-10-CM | POA: Insufficient documentation

## 2011-05-01 HISTORY — DX: Migraine, unspecified, not intractable, without status migrainosus: G43.909

## 2011-05-01 LAB — POCT I-STAT, CHEM 8
Chloride: 109 mEq/L (ref 96–112)
HCT: 42 % (ref 36.0–46.0)
Potassium: 3.9 mEq/L (ref 3.5–5.1)
Sodium: 143 mEq/L (ref 135–145)

## 2011-05-01 LAB — POCT PREGNANCY, URINE: Preg Test, Ur: NEGATIVE

## 2011-05-01 MED ORDER — DEXAMETHASONE SODIUM PHOSPHATE 10 MG/ML IJ SOLN
10.0000 mg | Freq: Once | INTRAMUSCULAR | Status: AC
  Administered 2011-05-01: 10 mg via INTRAVENOUS
  Filled 2011-05-01: qty 1

## 2011-05-01 MED ORDER — SODIUM CHLORIDE 0.9 % IV BOLUS (SEPSIS)
1000.0000 mL | Freq: Once | INTRAVENOUS | Status: AC
  Administered 2011-05-01: 1000 mL via INTRAVENOUS

## 2011-05-01 MED ORDER — DIPHENHYDRAMINE HCL 50 MG/ML IJ SOLN
25.0000 mg | Freq: Once | INTRAMUSCULAR | Status: AC
  Administered 2011-05-01: 25 mg via INTRAVENOUS
  Filled 2011-05-01: qty 1

## 2011-05-01 MED ORDER — KETOROLAC TROMETHAMINE 30 MG/ML IJ SOLN
30.0000 mg | Freq: Once | INTRAMUSCULAR | Status: AC
  Administered 2011-05-01: 30 mg via INTRAVENOUS
  Filled 2011-05-01: qty 1

## 2011-05-01 MED ORDER — PROMETHAZINE HCL 25 MG PO TABS: 25.0000 mg | ORAL_TABLET | Freq: Four times a day (QID) | ORAL | Status: AC | PRN

## 2011-05-01 MED ORDER — METOCLOPRAMIDE HCL 5 MG/ML IJ SOLN
10.0000 mg | Freq: Once | INTRAMUSCULAR | Status: AC
  Administered 2011-05-01: 10 mg via INTRAVENOUS
  Filled 2011-05-01: qty 2

## 2011-05-01 NOTE — Discharge Instructions (Signed)
  Please review the instructions below. You were evaluated in the emergency department tonight for your migraine headache. You received IV medication and IV fluids and admit to feeling much better. We are prescribing a short course of medication for nausea, please take as directed. Arrange follow up with your primary care physician if your headache returns and persists. Return for worsening symptoms otherwise follow up as discussed.    Migraine Headache A migraine headache is an intense, throbbing pain on one or both sides of your head. The exact cause of a migraine headache is not always known. A migraine may be caused when nerves in the brain become irritated and release chemicals that cause swelling within blood vessels, causing pain. Many migraine sufferers have a family history of migraines. Before you get a migraine you may or may not get an aura. An aura is a group of symptoms that can predict the beginning of a migraine. An aura may include:  Visual changes such as:   Flashing lights.   Bright spots or zig-zag lines.   Tunnel vision.   Feelings of numbness.   Trouble talking.   Muscle weakness.  SYMPTOMS  Pain on one or both sides of your head.   Pain that is pulsating or throbbing in nature.   Pain that is severe enough to prevent daily activities.   Pain that is aggravated by any daily physical activity.   Nausea (feeling sick to your stomach), vomiting, or both.   Pain with exposure to bright lights, loud noises, or activity.   General sensitivity to bright lights or loud noises.  MIGRAINE TRIGGERS Examples of triggers of migraine headaches include:   Alcohol.   Smoking.   Stress.   It may be related to menses (female menstruation).   Aged cheeses.   Foods or drinks that contain nitrates, glutamate, aspartame, or tyramine.   Lack of sleep.   Chocolate.   Caffeine.   Hunger.   Medications such as nitroglycerine (used to treat chest pain), birth  control pills, estrogen, and some blood pressure medications.  DIAGNOSIS  A migraine headache is often diagnosed based on:  Symptoms.   Physical examination.   A computerized X-ray scan (computed tomography, CT) of your head.  TREATMENT  Medications can help prevent migraines if they are recurrent or should they become recurrent. Your caregiver can help you with a medication or treatment program that will be helpful to you.   Lying down in a dark, quiet room may be helpful.   Keeping a headache diary may help you find a trend as to what may be triggering your headaches.  SEEK IMMEDIATE MEDICAL CARE IF:   You have confusion, personality changes or seizures.   You have headaches that wake you from sleep.   You have an increased frequency in your headaches.   You have a stiff neck.   You have a loss of vision.   You have muscle weakness.   You start losing your balance or have trouble walking.   You feel faint or pass out.  MAKE SURE YOU:   Understand these instructions.   Will watch your condition.   Will get help right away if you are not doing well or get worse.  Document Released: 01/24/2005 Document Revised: 01/13/2011 Document Reviewed: 09/09/2008 [facility] Patient Information 2012 [place], [place].

## 2011-05-01 NOTE — ED Notes (Signed)
Pt here with migraine which is associated with sensitivity to light and sound as well as nausea and vomiting.  Pt is in obvious distress.

## 2011-05-01 NOTE — ED Notes (Signed)
New IV started; patient complaining of pain at right hand IV site -- IV has no problems flushing; slight redness noted at sight.

## 2011-05-01 NOTE — Progress Notes (Signed)
Aromatherapy employed for pt's C/O migraine headache 8/10.  After education done, verbal consent obtained from pt for aromatherapy. Lavender oil and peppermint oil applied to cotton ball, taped to pt's gown. I instructed pt to breathe deeply. Wet washcloth applied to forehead. Will continue to monitor.

## 2011-05-04 NOTE — ED Provider Notes (Signed)
History     CSN: [id number]  Arrival date & time 05/01/11  4098   First MD Initiated Contact with Patient 05/01/11 2054      Chief Complaint  Patient presents with  . Migraine    HPI: Patient is a 46 y.o. female presenting with migraine.  Migraine This is a chronic problem. The current episode started today. The problem occurs constantly. The problem has been gradually worsening. Associated symptoms include headaches, nausea and vomiting. Pertinent negatives include no chills, fever or visual change. The treatment provided no relief.  Pt reports onset of migraine h/a at approx 1630. Admits to h/o chronic migraines. Took her Maxalt but it did not help. H/A has been associated photosensitivity and  n/v x at least 10 episodes. This h/a is c/w previous migraine h/a's. Denies any focal neurological c/o's.  Past Medical History  Diagnosis Date  . Migraines     Past Surgical History  Procedure Date  . Appendectomy     No family history on file.  History  Substance Use Topics  . Smoking status: Never Smoker   . Smokeless tobacco: Never Used  . Alcohol Use: No    OB History    Grav Para Term Preterm Abortions TAB SAB Ect Mult Living                  Review of Systems  Constitutional: Negative.  Negative for fever and chills.  Eyes: Negative.   Respiratory: Negative.   Cardiovascular: Negative.   Gastrointestinal: Positive for nausea and vomiting.  Genitourinary: Negative.   Musculoskeletal: Negative.   Skin: Negative.   Neurological: Positive for headaches.  Hematological: Negative.   Psychiatric/Behavioral: Negative.     Allergies  Sulfa antibiotics and Sulfacetamide sodium  Home Medications   Current Outpatient Rx  Name Route Sig Dispense Refill  . DESVENLAFAXINE SUCCINATE ER 100 MG PO TB24 Oral Take 100 mg by mouth daily.    . FUROSEMIDE 40 MG PO TABS Oral Take 40 mg by mouth as needed. For swelling.    [name] MECLIZINE HCL 25 MG PO TABS Oral Take 25 mg by  mouth 3 (three) times daily as needed. For dizziness/nausea.    [name] RIZATRIPTAN BENZOATE 10 MG PO TABS Oral Take 10 mg by mouth as needed. For migraines. May repeat in 2 hours if needed    . TOPIRAMATE 50 MG PO TABS Oral Take 50 mg by mouth 2 (two) times daily.    [name] PROMETHAZINE HCL 25 MG PO TABS Oral Take 1 tablet (25 mg total) by mouth every 6 (six) hours as needed for nausea. 10 tablet 0    BP 111/65  Pulse 72  Temp(Src) 97.3 F (36.3 C) (Oral)  Resp 18  SpO2 97%  Physical Exam  Constitutional: She is oriented to person, place, and time. She appears well-developed and well-nourished.  HENT:  Head: Normocephalic and atraumatic.  Eyes: Conjunctivae and EOM are normal. Pupils are equal, round, and reactive to light.  Neck: Neck supple.  Cardiovascular: Normal rate and regular rhythm.   Pulmonary/Chest: Effort normal and breath sounds normal.  Abdominal: Soft. Bowel sounds are normal.  Musculoskeletal: Normal range of motion.  Neurological: She is alert and oriented to person, place, and time. No cranial nerve deficit. Coordination normal.  Skin: Skin is warm and dry. Rash noted. Rash is papular. No erythema.  Psychiatric: She has a normal mood and affect.    ED Course  Procedures   Pt reports complete resolution of h/a  w/ IVF's and medications. Will plan for d/c home w/ med for nausea and encourage close f/u w/ PCP. Pt agreeable w/ plan.  Labs Reviewed  POCT I-STAT, CHEM 8 - Abnormal; Notable for the following:    Glucose, Bld 117 (*)    All other components within normal limits  POCT PREGNANCY, URINE  LAB REPORT - SCANNED   No results found.   1. Migraine headache       MDM  HPI/PE and clinical findings c/w 1. Migraine headache (H/A c/w previous migraines, no focal neurological findings, resolved w/ IVF's and medication)         Leanne Chang, NP 05/04/11 [phone]

## 2011-05-05 ENCOUNTER — Encounter: Payer: Self-pay | Admitting: Internal Medicine

## 2011-05-05 ENCOUNTER — Ambulatory Visit (INDEPENDENT_AMBULATORY_CARE_PROVIDER_SITE_OTHER): Payer: BC Managed Care – PPO | Admitting: Internal Medicine

## 2011-05-05 VITALS — BP 100/80 | Temp 98.2°F | Wt 200.0 lb

## 2011-05-05 DIAGNOSIS — F329 Major depressive disorder, single episode, unspecified: Secondary | ICD-10-CM

## 2011-05-05 DIAGNOSIS — R51 Headache: Secondary | ICD-10-CM

## 2011-05-05 DIAGNOSIS — J069 Acute upper respiratory infection, unspecified: Secondary | ICD-10-CM

## 2011-05-05 NOTE — Patient Instructions (Signed)
Continue present regimen    It is important that you exercise regularly, at least 20 minutes 3 to 4 times per week.  If you develop chest pain or shortness of breath seek  medical attention.  Consider counseling

## 2011-05-05 NOTE — Progress Notes (Signed)
  Subjective:    Patient ID: [name], female    DOB: November 04, 1965, 46 y.o.   MRN: [id number]  HPI  46 year old patient who is seen today in followup. She was seen in the ER 4 days ago with migraines associated with the vertigo and nausea. She is much improved but remains under considerable situational stress. Her husband has been unemployed for about 3 years. There are some other marital issues as well.  Her headaches are well controlled with Maxalt. She is also on Topamax prophylaxis  Review of Systems  Constitutional: Negative.   HENT: Negative for hearing loss, congestion, sore throat, rhinorrhea, dental problem, sinus pressure and tinnitus.   Eyes: Negative for pain, discharge and visual disturbance.  Respiratory: Negative for cough and shortness of breath.   Cardiovascular: Negative for chest pain, palpitations and leg swelling.  Gastrointestinal: Negative for nausea, vomiting, abdominal pain, diarrhea, constipation, blood in stool and abdominal distention.  Genitourinary: Negative for dysuria, urgency, frequency, hematuria, flank pain, vaginal bleeding, vaginal discharge, difficulty urinating, vaginal pain and pelvic pain.  Musculoskeletal: Negative for joint swelling, arthralgias and gait problem.  Skin: Negative for rash.  Neurological: Positive for headaches. Negative for dizziness, syncope, speech difficulty, weakness and numbness.  Hematological: Negative for adenopathy.  Psychiatric/Behavioral: Negative for behavioral problems, dysphoric mood and agitation. The patient is not nervous/anxious.        Objective:   Physical Exam  Constitutional: She is oriented to person, place, and time. She appears well-developed and well-nourished.  HENT:  Head: Normocephalic.  Right Ear: External ear normal.  Left Ear: External ear normal.  Mouth/Throat: Oropharynx is clear and moist.  Eyes: Conjunctivae and EOM are normal. Pupils are equal, round, and reactive to light.  Neck: Normal  range of motion. Neck supple. No thyromegaly present.  Cardiovascular: Normal rate, regular rhythm, normal heart sounds and intact distal pulses.   Pulmonary/Chest: Effort normal and breath sounds normal.  Abdominal: Soft. Bowel sounds are normal. She exhibits no mass. There is no tenderness.  Musculoskeletal: Normal range of motion.  Lymphadenopathy:    She has no cervical adenopathy.  Neurological: She is alert and oriented to person, place, and time.  Skin: Skin is warm and dry. No rash noted.  Psychiatric: She has a normal mood and affect. Her behavior is normal.          Assessment & Plan:   Status post viral URI Migraine headaches Situational stress and depression  We'll continue present regimen. Counseling discussed and encouraged information was dispensed Return here in 3 months

## 2011-05-06 NOTE — ED Provider Notes (Signed)
Medical screening examination/treatment/procedure(s) were performed by non-physician practitioner and as supervising physician I was immediately available for consultation/collaboration.  Hurman Horn, MD 05/06/11 [phone]

## 2011-05-26 ENCOUNTER — Ambulatory Visit (INDEPENDENT_AMBULATORY_CARE_PROVIDER_SITE_OTHER): Payer: BC Managed Care – PPO | Admitting: Licensed Clinical Social Worker

## 2011-05-26 DIAGNOSIS — F411 Generalized anxiety disorder: Secondary | ICD-10-CM

## 2011-05-26 DIAGNOSIS — F331 Major depressive disorder, recurrent, moderate: Secondary | ICD-10-CM

## 2011-05-27 ENCOUNTER — Ambulatory Visit: Payer: BC Managed Care – PPO | Admitting: Licensed Clinical Social Worker

## 2011-06-02 ENCOUNTER — Ambulatory Visit (INDEPENDENT_AMBULATORY_CARE_PROVIDER_SITE_OTHER): Payer: BC Managed Care – PPO | Admitting: Licensed Clinical Social Worker

## 2011-06-02 DIAGNOSIS — F331 Major depressive disorder, recurrent, moderate: Secondary | ICD-10-CM

## 2011-06-02 DIAGNOSIS — F411 Generalized anxiety disorder: Secondary | ICD-10-CM

## 2011-06-16 ENCOUNTER — Ambulatory Visit (INDEPENDENT_AMBULATORY_CARE_PROVIDER_SITE_OTHER): Payer: BC Managed Care – PPO | Admitting: Licensed Clinical Social Worker

## 2011-06-16 DIAGNOSIS — F331 Major depressive disorder, recurrent, moderate: Secondary | ICD-10-CM

## 2011-06-17 ENCOUNTER — Other Ambulatory Visit: Payer: Self-pay | Admitting: Internal Medicine

## 2011-06-30 ENCOUNTER — Ambulatory Visit (INDEPENDENT_AMBULATORY_CARE_PROVIDER_SITE_OTHER): Payer: BC Managed Care – PPO | Admitting: Licensed Clinical Social Worker

## 2011-06-30 DIAGNOSIS — F331 Major depressive disorder, recurrent, moderate: Secondary | ICD-10-CM

## 2011-07-21 ENCOUNTER — Ambulatory Visit: Payer: BC Managed Care – PPO | Admitting: Licensed Clinical Social Worker

## 2011-08-16 ENCOUNTER — Ambulatory Visit (INDEPENDENT_AMBULATORY_CARE_PROVIDER_SITE_OTHER): Payer: BC Managed Care – PPO | Admitting: Licensed Clinical Social Worker

## 2011-08-16 DIAGNOSIS — F331 Major depressive disorder, recurrent, moderate: Secondary | ICD-10-CM

## 2011-09-06 ENCOUNTER — Ambulatory Visit (INDEPENDENT_AMBULATORY_CARE_PROVIDER_SITE_OTHER): Payer: BC Managed Care – PPO | Admitting: Internal Medicine

## 2011-09-06 ENCOUNTER — Encounter: Payer: Self-pay | Admitting: Internal Medicine

## 2011-09-06 VITALS — BP 110/72 | Temp 98.4°F | Wt 198.0 lb

## 2011-09-06 DIAGNOSIS — F329 Major depressive disorder, single episode, unspecified: Secondary | ICD-10-CM

## 2011-09-06 DIAGNOSIS — R51 Headache: Secondary | ICD-10-CM

## 2011-09-06 DIAGNOSIS — J309 Allergic rhinitis, unspecified: Secondary | ICD-10-CM

## 2011-09-06 NOTE — Progress Notes (Signed)
  Subjective:    Patient ID: [name], female    DOB: Dec 18, 1965, 46 y.o.   MRN: [id number]  HPI  46 year old patient who is seen today for followup. She has a history of anxiety depression with significant stressors. She continues to have significant marital issues and wishes a legal separation. Her migraine headaches are better she and her therapist wondered if the past week is not effective but her stressors are unfortunately unchanged. She also complains of the sinus congestion and rhinorrhea which has not been helped by over-the-counter medications. She does have a history of allergic rhinitis    Review of Systems  Constitutional: Negative.   HENT: Positive for congestion and sinus pressure. Negative for hearing loss, sore throat, rhinorrhea, dental problem and tinnitus.   Eyes: Negative for pain, discharge and visual disturbance.  Respiratory: Negative for cough and shortness of breath.   Cardiovascular: Negative for chest pain, palpitations and leg swelling.  Gastrointestinal: Negative for nausea, vomiting, abdominal pain, diarrhea, constipation, blood in stool and abdominal distention.  Genitourinary: Negative for dysuria, urgency, frequency, hematuria, flank pain, vaginal bleeding, vaginal discharge, difficulty urinating, vaginal pain and pelvic pain.  Musculoskeletal: Negative for joint swelling, arthralgias and gait problem.  Skin: Negative for rash.  Neurological: Negative for dizziness, syncope, speech difficulty, weakness, numbness and headaches.  Hematological: Negative for adenopathy.  Psychiatric/Behavioral: Positive for behavioral problems, dysphoric mood and decreased concentration. Negative for agitation. The patient is not nervous/anxious.        Objective:   Physical Exam  Constitutional: She is oriented to person, place, and time. She appears well-developed and well-nourished.  HENT:  Head: Normocephalic.  Right Ear: External ear normal.  Left Ear: External  ear normal.  Mouth/Throat: Oropharynx is clear and moist.  Eyes: Conjunctivae and EOM are normal. Pupils are equal, round, and reactive to light.  Neck: Normal range of motion. Neck supple. No thyromegaly present.  Cardiovascular: Normal rate, regular rhythm, normal heart sounds and intact distal pulses.   Pulmonary/Chest: Effort normal and breath sounds normal.  Abdominal: Soft. Bowel sounds are normal. She exhibits no mass. There is no tenderness.  Musculoskeletal: Normal range of motion.  Lymphadenopathy:    She has no cervical adenopathy.  Neurological: She is alert and oriented to person, place, and time.  Skin: Skin is warm and dry. No rash noted.  Psychiatric: She has a normal mood and affect. Her behavior is normal.          Assessment & Plan:   Depression aggravated by considerable situational stressors. Options were discussed including a change in therapy. The patient was wishes to continue present therapy at this time and consider alternative therapy if any worsening Migraine headaches stable  Recheck 3 months

## 2011-09-06 NOTE — Patient Instructions (Addendum)
It is important that you exercise regularly, at least 20 minutes 3 to 4 times per week.  If you develop chest pain or shortness of breath seek  medical attention.  Return in 3 months for follow-up  

## 2011-10-12 ENCOUNTER — Ambulatory Visit (INDEPENDENT_AMBULATORY_CARE_PROVIDER_SITE_OTHER): Payer: BC Managed Care – PPO | Admitting: Internal Medicine

## 2011-10-12 ENCOUNTER — Ambulatory Visit: Payer: BC Managed Care – PPO | Admitting: Internal Medicine

## 2011-10-12 ENCOUNTER — Encounter: Payer: Self-pay | Admitting: Internal Medicine

## 2011-10-12 ENCOUNTER — Telehealth: Payer: Self-pay | Admitting: Family Medicine

## 2011-10-12 VITALS — BP 110/74 | Temp 98.1°F | Wt 198.0 lb

## 2011-10-12 DIAGNOSIS — R51 Headache: Secondary | ICD-10-CM

## 2011-10-12 DIAGNOSIS — F329 Major depressive disorder, single episode, unspecified: Secondary | ICD-10-CM

## 2011-10-12 MED ORDER — TOPIRAMATE 50 MG PO TABS: 50.0000 mg | ORAL_TABLET | Freq: Two times a day (BID) | ORAL | Status: DC

## 2011-10-12 MED ORDER — FUROSEMIDE 40 MG PO TABS: 40.0000 mg | ORAL_TABLET | ORAL | Status: DC | PRN

## 2011-10-12 MED ORDER — VENLAFAXINE HCL ER 150 MG PO CP24: 150.0000 mg | ORAL_CAPSULE | Freq: Every day | ORAL | Status: DC

## 2011-10-12 MED ORDER — RIZATRIPTAN BENZOATE 10 MG PO TABS: 10.0000 mg | ORAL_TABLET | ORAL | Status: DC | PRN

## 2011-10-12 MED ORDER — VENLAFAXINE HCL ER 75 MG PO CP24: 75.0000 mg | ORAL_CAPSULE | Freq: Every day | ORAL | Status: DC

## 2011-10-12 NOTE — Patient Instructions (Addendum)
Effexor 75 mg daily for 30 days then 150 mg daily  Call or return to clinic prn if these symptoms worsen or fail to improve as anticipated.

## 2011-10-12 NOTE — Telephone Encounter (Signed)
Spoke with Denny Peon - gave instructions

## 2011-10-12 NOTE — Telephone Encounter (Signed)
There is 75mg  and 150mg  on the med list - both escribed - please advise

## 2011-10-12 NOTE — Progress Notes (Signed)
  Subjective:    Patient ID: [name], female    DOB: 1965-05-16, 46 y.o.   MRN: [id number]  HPI   46 year old patient who is seen today for followup. She has history depression as well as migraine headaches. She has been unable to afford her present medication and wishes to switch back to Effexor which has been helpful in the past. Her depression and migraine headaches have been fairly stable. She has been undergoing situational stress and will be separating from her husband and changing practices.   Review of Systems  Neurological: Positive for headaches.  Psychiatric/Behavioral: Positive for dysphoric mood. The patient is nervous/anxious.        Objective:   Physical Exam  Constitutional: She appears well-developed and well-nourished. No distress.  Psychiatric: She has a normal mood and affect. Her behavior is normal. Judgment and thought content normal.          Assessment & Plan:   Depression. Will change to Effexor which has been quite efficacious in the past. All her medications refilled Migraine headaches stable  Return here when necessary. The patient will be separating from her husband who is a patient here and will be changing the practice location.

## 2011-10-12 NOTE — Telephone Encounter (Signed)
[facility] received 2 Escribes on this pt, both for Effexor XR, two diff dosages. Please call to clarify.

## 2011-10-12 NOTE — Telephone Encounter (Signed)
Will take 75 mg daily for 30 days before starting 150 mg daily

## 2011-11-23 ENCOUNTER — Ambulatory Visit: Payer: BC Managed Care – PPO | Admitting: Internal Medicine

## 2011-12-08 ENCOUNTER — Ambulatory Visit: Payer: BC Managed Care – PPO | Admitting: Internal Medicine

## 2012-01-13 ENCOUNTER — Other Ambulatory Visit: Payer: Self-pay | Admitting: Obstetrics and Gynecology

## 2012-01-13 ENCOUNTER — Ambulatory Visit (INDEPENDENT_AMBULATORY_CARE_PROVIDER_SITE_OTHER): Payer: BC Managed Care – PPO | Admitting: Internal Medicine

## 2012-01-13 ENCOUNTER — Ambulatory Visit (INDEPENDENT_AMBULATORY_CARE_PROVIDER_SITE_OTHER)
Admission: RE | Admit: 2012-01-13 | Discharge: 2012-01-13 | Disposition: A | Discharge status: Home / Self Care | Payer: BC Managed Care – PPO | Source: Ambulatory Visit | Attending: Internal Medicine | Admitting: Internal Medicine

## 2012-01-13 ENCOUNTER — Other Ambulatory Visit (INDEPENDENT_AMBULATORY_CARE_PROVIDER_SITE_OTHER): Payer: BC Managed Care – PPO

## 2012-01-13 ENCOUNTER — Encounter: Payer: Self-pay | Admitting: Internal Medicine

## 2012-01-13 VITALS — BP 108/70 | HR 67 | Temp 97.7°F | Resp 16 | Ht 64.0 in | Wt 194.0 lb

## 2012-01-13 DIAGNOSIS — M545 Low back pain, unspecified: Secondary | ICD-10-CM

## 2012-01-13 DIAGNOSIS — F329 Major depressive disorder, single episode, unspecified: Secondary | ICD-10-CM

## 2012-01-13 DIAGNOSIS — Z1231 Encounter for screening mammogram for malignant neoplasm of breast: Secondary | ICD-10-CM

## 2012-01-13 DIAGNOSIS — M5431 Sciatica, right side: Secondary | ICD-10-CM

## 2012-01-13 DIAGNOSIS — R209 Unspecified disturbances of skin sensation: Secondary | ICD-10-CM

## 2012-01-13 DIAGNOSIS — M5430 Sciatica, unspecified side: Secondary | ICD-10-CM

## 2012-01-13 DIAGNOSIS — F3289 Other specified depressive episodes: Secondary | ICD-10-CM

## 2012-01-13 DIAGNOSIS — R7309 Other abnormal glucose: Secondary | ICD-10-CM | POA: Insufficient documentation

## 2012-01-13 DIAGNOSIS — R200 Anesthesia of skin: Secondary | ICD-10-CM

## 2012-01-13 LAB — CBC WITH DIFFERENTIAL/PLATELET
Basophils Relative: 0.5 % (ref 0.0–3.0)
Hemoglobin: 13.9 g/dL (ref 12.0–15.0)
Lymphocytes Relative: 22.8 % (ref 12.0–46.0)
Monocytes Relative: 6.7 % (ref 3.0–12.0)
Neutro Abs: 6.2 10*3/uL (ref 1.4–7.7)
Neutrophils Relative %: 68.7 % (ref 43.0–77.0)
RBC: 4.61 Mil/uL (ref 3.87–5.11)
WBC: 9.1 10*3/uL (ref 4.5–10.5)

## 2012-01-13 LAB — HEMOGLOBIN A1C: Hgb A1c MFr Bld: 5.3 % (ref 4.6–6.5)

## 2012-01-13 LAB — URINALYSIS, ROUTINE W REFLEX MICROSCOPIC
Hgb urine dipstick: NEGATIVE
Leukocytes, UA: NEGATIVE
Nitrite: NEGATIVE
Specific Gravity, Urine: 1.02 (ref 1.000–1.030)
Urine Glucose: NEGATIVE
Urobilinogen, UA: 0.2 (ref 0.0–1.0)

## 2012-01-13 LAB — COMPREHENSIVE METABOLIC PANEL
ALT: 17 U/L (ref 0–35)
Albumin: 4.1 g/dL (ref 3.5–5.2)
BUN: 11 mg/dL (ref 6–23)
CO2: 24 mEq/L (ref 19–32)
Calcium: 8.8 mg/dL (ref 8.4–10.5)
Chloride: 110 mEq/L (ref 96–112)
Creatinine, Ser: 0.8 mg/dL (ref 0.4–1.2)
GFR: 89.05 mL/min (ref >60.00)
Potassium: 3.9 mEq/L (ref 3.5–5.1)

## 2012-01-13 LAB — LIPID PANEL
HDL: 41.7 mg/dL (ref >39.00)
Total CHOL/HDL Ratio: 4
Triglycerides: 106 mg/dL (ref 0.0–149.0)

## 2012-01-13 LAB — FOLATE: Folate: 20.2 ng/mL (ref >5.9)

## 2012-01-13 MED ORDER — CYCLOBENZAPRINE HCL 10 MG PO TABS: 10.0000 mg | ORAL_TABLET | Freq: Three times a day (TID) | ORAL | Status: DC | PRN

## 2012-01-13 MED ORDER — NAPROXEN 500 MG PO TABS: 500.0000 mg | ORAL_TABLET | Freq: Two times a day (BID) | ORAL | Status: DC

## 2012-01-13 NOTE — Patient Instructions (Signed)
Sciatica Sciatica is pain, weakness, numbness, or tingling along the path of the sciatic nerve. The nerve starts in the lower back and runs down the back of each leg. The nerve controls the muscles in the lower leg and in the back of the knee, while also providing sensation to the back of the thigh, lower leg, and the sole of your foot. Sciatica is a symptom of another medical condition. For instance, nerve damage or certain conditions, such as a herniated disk or bone spur on the spine, pinch or put pressure on the sciatic nerve. This causes the pain, weakness, or other sensations normally associated with sciatica. Generally, sciatica only affects one side of the body. CAUSES   Herniated or slipped disc.  Degenerative disk disease.  A pain disorder involving the narrow muscle in the buttocks (piriformis syndrome).  Pelvic injury or fracture.  Pregnancy.  Tumor (rare). SYMPTOMS  Symptoms can vary from mild to very severe. The symptoms usually travel from the low back to the buttocks and down the back of the leg. Symptoms can include:  Mild tingling or dull aches in the lower back, leg, or hip.  Numbness in the back of the calf or sole of the foot.  Burning sensations in the lower back, leg, or hip.  Sharp pains in the lower back, leg, or hip.  Leg weakness.  Severe back pain inhibiting movement. These symptoms may get worse with coughing, sneezing, laughing, or prolonged sitting or standing. Also, being overweight may worsen symptoms. DIAGNOSIS  Your caregiver will perform a physical exam to look for common symptoms of sciatica. He or she may ask you to do certain movements or activities that would trigger sciatic nerve pain. Other tests may be performed to find the cause of the sciatica. These may include:  Blood tests.  X-rays.  Imaging tests, such as an MRI or CT scan. TREATMENT  Treatment is directed at the cause of the sciatic pain. Sometimes, treatment is not necessary  and the pain and discomfort goes away on its own. If treatment is needed, your caregiver may suggest:  Over-the-counter medicines to relieve pain.  Prescription medicines, such as anti-inflammatory medicine, muscle relaxants, or narcotics.  Applying heat or ice to the painful area.  Steroid injections to lessen pain, irritation, and inflammation around the nerve.  Reducing activity during periods of pain.  Exercising and stretching to strengthen your abdomen and improve flexibility of your spine. Your caregiver may suggest losing weight if the extra weight makes the back pain worse.  Physical therapy.  Surgery to eliminate what is pressing or pinching the nerve, such as a bone spur or part of a herniated disk. HOME CARE INSTRUCTIONS   Only take over-the-counter or prescription medicines for pain or discomfort as directed by your caregiver.  Apply ice to the affected area for 20 minutes, 3 4 times a day for the first 48 72 hours. Then try heat in the same way.  Exercise, stretch, or perform your usual activities if these do not aggravate your pain.  Attend physical therapy sessions as directed by your caregiver.  Keep all follow-up appointments as directed by your caregiver.  Do not wear high heels or shoes that do not provide proper support.  Check your mattress to see if it is too soft. A firm mattress may lessen your pain and discomfort. SEEK IMMEDIATE MEDICAL CARE IF:   You lose control of your bowel or bladder (incontinence).  You have increasing weakness in the lower back,   pelvis, buttocks, or legs.  You have redness or swelling of your back.  You have a burning sensation when you urinate.  You have pain that gets worse when you lie down or awakens you at night.  Your pain is worse than you have experienced in the past.  Your pain is lasting longer than 4 weeks.  You are suddenly losing weight without reason. MAKE SURE YOU:  Understand these  instructions.  Will watch your condition.  Will get help right away if you are not doing well or get worse. Document Released: 01/18/2001 Document Revised: 07/26/2011 Document Reviewed: 06/05/2011 [employer] Patient Information 2013 [employer].  

## 2012-01-13 NOTE — Progress Notes (Signed)
Subjective:    Patient ID: [name], female    DOB: Dec 18, 1965, 46 y.o.   MRN: [id number]  Back Pain This is a chronic problem. Episode onset: 2 months. The problem occurs intermittently. The problem has been gradually worsening since onset. The pain is present in the lumbar spine. The quality of the pain is described as stabbing and shooting. The pain radiates to the right thigh. The pain is at a severity of 4/10. The pain is moderate. The pain is worse during the day. The symptoms are aggravated by bending and position. Stiffness is present all day. Associated symptoms include leg pain (right butt and thigh) and numbness (in both feet R >>> L). Pertinent negatives include no abdominal pain, bladder incontinence, bowel incontinence, chest pain, dysuria, fever, headaches, paresis, paresthesias, pelvic pain, perianal numbness, tingling, weakness or weight loss. Risk factors include obesity and lack of exercise. She has tried NSAIDs for the symptoms. The treatment provided mild relief.      Review of Systems  Constitutional: Negative for fever, chills, weight loss, diaphoresis, activity change, appetite change, fatigue and unexpected weight change.  HENT: Negative.   Eyes: Negative.   Respiratory: Negative for cough, chest tightness, shortness of breath, wheezing and stridor.   Cardiovascular: Negative for chest pain, palpitations and leg swelling.  Gastrointestinal: Negative for nausea, vomiting, abdominal pain, diarrhea, constipation, blood in stool and bowel incontinence.  Genitourinary: Negative.  Negative for bladder incontinence, dysuria and pelvic pain.  Musculoskeletal: Positive for back pain. Negative for myalgias, joint swelling, arthralgias and gait problem.  Skin: Negative for color change, pallor, rash and wound.  Neurological: Positive for numbness (in both feet R >>> L). Negative for dizziness, tingling, tremors, seizures, syncope, facial asymmetry, speech difficulty, weakness,  light-headedness, headaches and paresthesias.  Hematological: Negative for adenopathy. Does not bruise/bleed easily.  Psychiatric/Behavioral: Negative.        Objective:   Physical Exam  Vitals reviewed. Constitutional: She is oriented to person, place, and time. She appears well-developed and well-nourished. No distress.  HENT:  Head: Normocephalic and atraumatic.  Mouth/Throat: Oropharynx is clear and moist. No oropharyngeal exudate.  Eyes: Conjunctivae normal are normal. Right eye exhibits no discharge. Left eye exhibits no discharge. No scleral icterus.  Neck: Normal range of motion. Neck supple. No JVD present. No tracheal deviation present. No thyromegaly present.  Cardiovascular: Normal rate, regular rhythm, normal heart sounds and intact distal pulses.  Exam reveals no gallop and no friction rub.   No murmur heard. Pulmonary/Chest: Effort normal and breath sounds normal. No stridor. No respiratory distress. She has no wheezes. She has no rales. She exhibits no tenderness.  Abdominal: Soft. Bowel sounds are normal. She exhibits no distension and no mass. There is no tenderness. There is no rebound and no guarding.  Musculoskeletal: Normal range of motion. She exhibits no edema and no tenderness.  Lymphadenopathy:    She has no cervical adenopathy.  Neurological: She is alert and oriented to person, place, and time. She has normal strength. She displays no atrophy, no tremor and normal reflexes. No cranial nerve deficit or sensory deficit. She exhibits normal muscle tone. She displays no seizure activity. Coordination and gait normal. She displays no Babinski's sign on the left side.  Reflex Scores:      Tricep reflexes are 1+ on the right side and 1+ on the left side.      Bicep reflexes are 1+ on the right side and 1+ on the left side.  Brachioradialis reflexes are 1+ on the right side and 1+ on the left side.      Patellar reflexes are 1+ on the right side and 1+ on the left  side.      Achilles reflexes are 1+ on the right side and 1+ on the left side.      - SLR in BLE  Skin: Skin is warm and dry. No rash noted. She is not diaphoretic. No erythema. No pallor.  Psychiatric: She has a normal mood and affect. Her behavior is normal. Judgment and thought content normal.      Lab Results  Component Value Date   HGB 14.3 05/01/2011   HCT 42.0 05/01/2011   GLUCOSE 117* 05/01/2011   NA 143 05/01/2011   K 3.9 05/01/2011   CL 109 05/01/2011   CREATININE 0.80 05/01/2011   BUN 10 05/01/2011      Assessment & Plan:

## 2012-01-15 ENCOUNTER — Encounter: Payer: Self-pay | Admitting: Internal Medicine

## 2012-01-15 NOTE — Assessment & Plan Note (Signed)
I will check her a1c to see if she has developed DM II 

## 2012-01-15 NOTE — Assessment & Plan Note (Signed)
She will try flexeril and naprosyn for the pain I have asked to start PT as well

## 2012-01-15 NOTE — Assessment & Plan Note (Signed)
I will check a plain film of her L-spine to see if there is a structural cause for her LBP She will start PT and take nsaids and muscle relaxer for symptom relief

## 2012-01-15 NOTE — Assessment & Plan Note (Signed)
I will check her labs to look for organic causes such as B12 defic and thyroid disease I have also asked her to get a NCS/EMG done to see if she has a peripheral neuropathy

## 2012-01-17 ENCOUNTER — Telehealth: Payer: Self-pay | Admitting: *Deleted

## 2012-01-17 DIAGNOSIS — M5431 Sciatica, right side: Secondary | ICD-10-CM

## 2012-01-17 DIAGNOSIS — M545 Low back pain: Secondary | ICD-10-CM

## 2012-01-17 DIAGNOSIS — M79604 Pain in right leg: Secondary | ICD-10-CM

## 2012-01-17 NOTE — Telephone Encounter (Signed)
Pt called for results of xray and lab results from 01/13/2012-pt informed of results. Pt is asking MD's advisement for what she can do for pinched nerve in lower back.

## 2012-01-18 NOTE — Telephone Encounter (Signed)
Pt informed of referral

## 2012-01-18 NOTE — Telephone Encounter (Signed)
I will refer her to a pain specialist

## 2012-01-23 ENCOUNTER — Encounter: Payer: Self-pay | Admitting: Physical Medicine and Rehabilitation

## 2012-02-03 ENCOUNTER — Encounter: Payer: BC Managed Care – PPO | Admitting: Physical Medicine and Rehabilitation

## 2012-02-17 ENCOUNTER — Ambulatory Visit
Admission: RE | Admit: 2012-02-17 | Discharge: 2012-02-17 | Disposition: A | Discharge status: Home / Self Care | Payer: BC Managed Care – PPO | Source: Ambulatory Visit | Attending: Obstetrics and Gynecology | Admitting: Obstetrics and Gynecology

## 2012-02-17 DIAGNOSIS — Z1231 Encounter for screening mammogram for malignant neoplasm of breast: Secondary | ICD-10-CM

## 2012-02-17 LAB — HM MAMMOGRAPHY: HM Mammogram: NORMAL

## 2012-02-24 ENCOUNTER — Encounter: Payer: Self-pay | Admitting: Physical Medicine and Rehabilitation

## 2012-02-24 ENCOUNTER — Encounter
Payer: BC Managed Care – PPO | Attending: Physical Medicine and Rehabilitation | Admitting: Physical Medicine and Rehabilitation

## 2012-02-24 VITALS — BP 125/79 | HR 71 | Resp 14 | Ht 64.0 in | Wt 197.0 lb

## 2012-02-24 DIAGNOSIS — M549 Dorsalgia, unspecified: Secondary | ICD-10-CM

## 2012-02-24 DIAGNOSIS — M51379 Other intervertebral disc degeneration, lumbosacral region without mention of lumbar back pain or lower extremity pain: Secondary | ICD-10-CM | POA: Insufficient documentation

## 2012-02-24 DIAGNOSIS — M545 Low back pain, unspecified: Secondary | ICD-10-CM | POA: Insufficient documentation

## 2012-02-24 DIAGNOSIS — M25559 Pain in unspecified hip: Secondary | ICD-10-CM | POA: Insufficient documentation

## 2012-02-24 DIAGNOSIS — M7061 Trochanteric bursitis, right hip: Secondary | ICD-10-CM

## 2012-02-24 DIAGNOSIS — Z5181 Encounter for therapeutic drug level monitoring: Secondary | ICD-10-CM

## 2012-02-24 DIAGNOSIS — M76899 Other specified enthesopathies of unspecified lower limb, excluding foot: Secondary | ICD-10-CM | POA: Insufficient documentation

## 2012-02-24 DIAGNOSIS — M5137 Other intervertebral disc degeneration, lumbosacral region: Secondary | ICD-10-CM | POA: Insufficient documentation

## 2012-02-24 NOTE — Progress Notes (Deleted)
Subjective:    Patient ID: [name], female    DOB: [birth year], 47 y.o.   MRN: [id number]  HPI  The patient is a 47 year old woman who is kindly referred by Dr. Sanda Linger. Her chief complaint is two-fold.  Her first problem is right low back pain. She reports that she has a history of intermittent low back pain for the last year. In October her right low back pain increased significantly. Her pain remained and a rather high level up until last few weeks. The pain came on rather suddenly and has slowly been improving. Her pain is aggravated by prolonged sitting, prolonged standing also bothers her especially in the right low back region. She had x-rays 01/13/12 which showed some disc space narrowing at L4-5. She denies any problems with continued numbness or tingling although she had some at the onset in her right foot in the second and third toes. This is not persisted however. She had been treated with Flexeril Naprosyn and she takes Maxwell can Topamax for headaches.  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 She does have 3 children at home ages 56, 73 and 32.  Regarding her back since it has improved significantly at this point she is mostly interested in avoiding a flareup as she has had recently.  Her next problem is right lateral hip pain which is aggravated by walking for a longer period of time. (45 minutes to an hour) she states she's had this problem for several years but over the last 6 months it seems to have gotten worse. Again there is no numbness or weakness or tingling associated with it.  She has a long history (15 years) of bladder problems and has been treated in the past by urologists for interstitial cysititis.  Pain Inventory Average Pain 3 Pain Right Now 3 My pain is sharp  In the last 24 hours, has pain interfered with the following? General activity 2 Relation with others 0 Enjoyment of  life 0 What TIME of day is your pain at its worst? morning and evening Sleep (in general) Poor  Pain is worse with: walking and standing Pain improves with: rest and heat/ice Relief from Meds: 5  Mobility walk without assistance how many minutes can you walk? 30 ability to climb steps?  yes do you drive?  yes Do you have any goals in this area?  no  Function employed # of hrs/week 40 what is your job? [employer] work at desk Do you have any goals in this area?  no  Neuro/Psych bladder control problems numbness depression  Prior Studies Any changes since last visit?  no nerve study  Physicians involved in your care Primary care Sanda Linger   Family History  Problem Relation Age of Onset  . Diabetes Mother   . Alcohol abuse Mother   . Diabetes Father   . Heart disease Father   . Alcohol abuse Father    History   Social History  . Marital Status: Legally Separated    Spouse Name: N/A    Number of Children: N/A  . Years of Education: N/A   Social History Main Topics  . Smoking status: Never Smoker   . Smokeless tobacco: Never Used  . Alcohol Use: No  . Drug Use: No  . Sexually Active: None   Other Topics Concern  . None   Social History Narrative  .  None   Past Surgical History  Procedure Date  . Appendectomy   . Laporoscopy   . Wisdom tooth extraction   . Lithotripsy    Past Medical History  Diagnosis Date  . Migraines   . Kidney stones    BP 125/79  Pulse 71  Resp 14  Ht 5\' 4"  (1.626 m)  Wt 197 lb (89.359 kg)  BMI 33.82 kg/m2  SpO2 96%     Review of Systems  Musculoskeletal: Positive for back pain.  Neurological: Positive for numbness.  Psychiatric/Behavioral: Positive for dysphoric mood.  All other systems reviewed and are negative.       Objective:   Physical Exam She is an obese woman who does not appear in any distress.  She's oriented x3 her speech is clear her affect is bright she's alert cooperative and pleasant  She  follows commands without difficulty answers questions appropriately  Cranial nerves and coordination are intact  Reflexes are 2+ at biceps triceps and brachioradialis Hoffmann negative  Reflexes are 2+ at patellar tendons and 2+ at the Achilles tendon There is no abnormal tone clonus or tremors  Sensory exam in the lower extremities is intact to pinprick, vibratory, light touch  Manual muscle testing reveals 5 over 5 strength at hip flexors, knee extensors and flexors, dorsiflexors, plantar flexors, everters and EHL  Straight leg raise is negative  She transitions easily from sitting to standing her gait is normal  She is able to walk on heels and toes  Tandem gait Romberg test are performed adequately  Forward flexion increases low back pain mildly returned from flexion increases back pain special her right low lumbar region Extension aggravates pain right low back and lumbosacral junction  Internal and external rotation at the hips reveals good range of motion  Palpation over left trochanter nontender    palpation over right trochanter reveals tenderness over trochanter as well as into gluteal musculature.   ORT 6    Assessment & Plan:  1. Lumbago status post flareup/L4-5 disc space narrowing noted per x-ray last month.Pain may be related to disc/facet. Patient reports overall improvement in low back pain at this time and is not interested in further medication but is interested in learning how to avoid future flareups.  Recommend physical therapy to address ergonomic issues at work, educate on proper body mechanics posture , educate on managing acute back pain, begin core strengthening moving toward . home program.  2. Right trochanteric bursitis/likely involvement in external rotators/tendonopthy:  Would like physical therapy to address muscle imbalance and strength and flexibility around hip, may use ice and ultrasound as needed, address IT band tightness.  If therapy  alone does not improve may consider injection. Patient is already trialed on some NSAIDs.  I will see her back in 6 weeks to see how she's doing

## 2012-02-24 NOTE — Patient Instructions (Addendum)
Today we have discussed 2 areas that are bothering you. The first problem was your low back pain which is located on the right side of your low back and is worse with prolonged standing and some time sitting.  Second problem you mentioned is pain on the side of your right hip which is worse when you are walking for a prolonged period of time. This maybe related to hip bursitis and possibly tendinitis of some of the hip musculature.  I have reviewed your lumbar x-rays using spine models.  I have reviewed causes for pain in your right hip using anatomic charts.  We have discussed various treatment options.  It is my understanding your pain is not getting in the way of your function at this time. I understand you have a significant flareup which lasted several months in you are wondering what to do to avoid this again.  I understand your job is fairly sedentary, and you would like to get more active but are unsure what to do.  We have decided on considering physical therapy as a treatment to help you with your back and your right hip pain.  I know you had the hip pain many years and it may take a while to work on this in physical therapy. You will also need to work on this after therapy is discontinued in a home program.  I understand you are not interested in injections at this time.  We have discussed medications and have decided that at this time they are not needed for these current problems.  I will see you back in one month   Back Pain, Adult Low back pain is very common. About 1 in 5 people have back pain.The cause of low back pain is rarely dangerous. The pain often gets better over time.About half of people with a sudden onset of back pain feel better in just 2 weeks. About 8 in 10 people feel better by 6 weeks.  CAUSES Some common causes of back pain include:  Strain of the muscles or ligaments supporting the spine.  Wear and tear (degeneration) of the spinal  discs.  Arthritis.  Direct injury to the back. DIAGNOSIS Most of the time, the direct cause of low back pain is not known.However, back pain can be treated effectively even when the exact cause of the pain is unknown.Answering your caregiver's questions about your overall health and symptoms is one of the most accurate ways to make sure the cause of your pain is not dangerous. If your caregiver needs more information, he or she may order lab work or imaging tests (X-rays or MRIs).However, even if imaging tests show changes in your back, this usually does not require surgery. HOME CARE INSTRUCTIONS For many people, back pain returns.Since low back pain is rarely dangerous, it is often a condition that people can learn to [facility] their own.   Remain active. It is stressful on the back to sit or stand in one place. Do not sit, drive, or stand in one place for more than 30 minutes at a time. Take short walks on level surfaces as soon as pain allows.Try to increase the length of time you walk each day.  Do not stay in bed.Resting more than 1 or 2 days can delay your recovery.  Do not avoid exercise or work.Your body is made to move.It is not dangerous to be active, even though your back may hurt.Your back will likely heal faster if you return to being active  before your pain is gone.  Pay attention to your body when you bend and lift. Many people have less discomfortwhen lifting if they bend their knees, keep the load close to their bodies,and avoid twisting. Often, the most comfortable positions are those that put less stress on your recovering back.  Find a comfortable position to sleep. Use a firm mattress and lie on your side with your knees slightly bent. If you lie on your back, put a pillow under your knees.  Only take over-the-counter or prescription medicines as directed by your caregiver. Over-the-counter medicines to reduce pain and inflammation are often the most  helpful.Your caregiver may prescribe muscle relaxant drugs.These medicines help dull your pain so you can more quickly return to your normal activities and healthy exercise.  Put ice on the injured area.  Put ice in a plastic bag.  Place a towel between your skin and the bag.  Leave the ice on for 15 to 20 minutes, 3 to 4 times a day for the first 2 to 3 days. After that, ice and heat may be alternated to reduce pain and spasms.  Ask your caregiver about trying back exercises and gentle massage. This may be of some benefit.  Avoid feeling anxious or stressed.Stress increases muscle tension and can worsen back pain.It is important to recognize when you are anxious or stressed and learn ways to manage it.Exercise is a great option. SEEK MEDICAL CARE IF:  You have pain that is not relieved with rest or medicine.  You have pain that does not improve in 1 week.  You have new symptoms.  You are generally not feeling well. SEEK IMMEDIATE MEDICAL CARE IF:   You have pain that radiates from your back into your legs.  You develop new bowel or bladder control problems.  You have unusual weakness or numbness in your arms or legs.  You develop nausea or vomiting.  You develop abdominal pain.  You feel faint. Document Released: 01/24/2005 Document Revised: 07/26/2011 Document Reviewed: 06/14/2010 [facility] Patient Information 2013 [place], [place].   What is bursitis? - Bursitis is a condition that can cause pain or swelling next to a joint. Most of the time, bursitis happens around the shoulder, elbow, hip, or knee. It can also happen around other joints in the body. A "bursa" is a small fluid-filled sac that sits near a bone. It cushions and protects nearby tissues when they rub on or slide over bones. These sacs, called "bursae," are found in many places throughout the body (figure 1 and figure 2). Bursitis happens when a bursa gets irritated and swollen. This can happen when a  person: Moves a joint over and over again in the same way, over a short period of time  Sits on a hard surface or stays in a position that presses on the bursa for a long time  Has certain kinds of arthritis, such as gout or rheumatoid arthritis, that can affect their joints and bursae  Gets hurt near a bursa  Has an infection that spreads to a bursa What are the symptoms of bursitis? - Symptoms of bursitis can include: Pain, including when the area is touched  Swelling  Trouble moving the joint

## 2012-03-19 ENCOUNTER — Other Ambulatory Visit: Payer: Self-pay | Admitting: Internal Medicine

## 2012-04-11 ENCOUNTER — Ambulatory Visit: Payer: BC Managed Care – PPO | Admitting: Physical Medicine and Rehabilitation

## 2012-04-11 ENCOUNTER — Encounter: Payer: Self-pay | Admitting: Physical Medicine and Rehabilitation

## 2012-04-11 ENCOUNTER — Encounter
Payer: BC Managed Care – PPO | Attending: Physical Medicine and Rehabilitation | Admitting: Physical Medicine and Rehabilitation

## 2012-04-11 VITALS — BP 120/78 | HR 61 | Resp 14 | Ht 64.0 in | Wt 197.0 lb

## 2012-04-11 DIAGNOSIS — M25559 Pain in unspecified hip: Secondary | ICD-10-CM | POA: Insufficient documentation

## 2012-04-11 DIAGNOSIS — M7061 Trochanteric bursitis, right hip: Secondary | ICD-10-CM

## 2012-04-11 DIAGNOSIS — M5137 Other intervertebral disc degeneration, lumbosacral region: Secondary | ICD-10-CM | POA: Insufficient documentation

## 2012-04-11 DIAGNOSIS — M51379 Other intervertebral disc degeneration, lumbosacral region without mention of lumbar back pain or lower extremity pain: Secondary | ICD-10-CM | POA: Insufficient documentation

## 2012-04-11 DIAGNOSIS — G8929 Other chronic pain: Secondary | ICD-10-CM

## 2012-04-11 DIAGNOSIS — M76899 Other specified enthesopathies of unspecified lower limb, excluding foot: Secondary | ICD-10-CM

## 2012-04-11 DIAGNOSIS — M545 Low back pain, unspecified: Secondary | ICD-10-CM

## 2012-04-11 DIAGNOSIS — M5090 Cervical disc disorder, unspecified, unspecified cervical region: Secondary | ICD-10-CM

## 2012-04-11 NOTE — Progress Notes (Signed)
Subjective:    Patient ID: [name], female    DOB: 24-May-1965, 47 y.o.   MRN: [id number]  HPI   The patient is a 47 year old woman who is kindly referred by Dr. Sanda Linger. She was initially seen on January 17 and at that time her chief complaint was right low back pain and right lateral hip pain.  She was treated with muscle relaxer's and Naprosyn and was given a prescription to attend physical therapy with an emphasis on a dressing pain management as well as muscle imbalances education on proper body mechanics and posture and pacing.  She is back in today and reports that she was doing quite well until she cleaned her son's room this last weekend. She reports she cleaned all day on Sunday and also was doing laundry.  Prior to her cleaning she was a 1-2 on a scale of 10 with respect to her back and her right hip. Currently she reports she is about a 4 on a scale of 10 with these areas.  Her chief complaint today is some chronic neck pain which she's had for about 5 years which wax is and wanes in intensity depending on what she does. She reports she sits at a computer for 8 hours a day, she also has a history of migraine headaches. This recent bout of neck pain is not associated with any numbness tingling weakness trauma or injury.  X-rays from 2008 showed degenerative changes at C4-C5.  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 She does have 3 children at home ages 25, 7 and 92.      Pain Inventory Average Pain 5 Pain Right Now 4 My pain is intermittent and sharp  In the last 24 hours, has pain interfered with the following? General activity 4 Relation with others 0 Enjoyment of life 4 What TIME of day is your pain at its worst? evening Sleep (in general) Fair  Pain is worse with:  bending, sitting and standing Pain improves with: rest Relief from Meds: 2  Mobility walk without assistance how many minutes can you walk? 30 ability to climb steps?  yes do you drive?  yes Do you have any goals in this area?  yes  Function employed # of hrs/week [employer] Do you have any goals in this area?  yes  Neuro/Psych bladder control problems numbness depression  Prior Studies Any changes since last visit?  no  Physicians involved in your care Any changes since last visit?  no   Family History  Problem Relation Age of Onset  . Diabetes Mother   . Alcohol abuse Mother   . Diabetes Father   . Heart disease Father   . Alcohol abuse Father    History   Social History  . Marital Status: Legally Separated    Spouse Name: N/A    Number of Children: N/A  . Years of Education: N/A   Social History Main Topics  . Smoking status: Never Smoker   . Smokeless tobacco: Never Used  . Alcohol Use: No  . Drug Use: No  . Sexually Active: None   Other Topics Concern  . None   Social History Narrative  . None   Past Surgical History  Procedure Laterality Date  .  Appendectomy    . Laporoscopy    . Wisdom tooth extraction    . Lithotripsy     Past Medical History  Diagnosis Date  . Migraines   . Kidney stones    BP 120/78  Pulse 61  Resp 14  Ht 5\' 4"  (1.626 m)  Wt 197 lb (89.359 kg)  BMI 33.8 kg/m2  SpO2 96%     Review of Systems  Constitutional: Positive for unexpected weight change.  Gastrointestinal: Positive for vomiting.  Musculoskeletal: Positive for back pain.  Neurological: Positive for numbness.  Psychiatric/Behavioral: Positive for dysphoric mood.  All other systems reviewed and are negative.       Objective:   Physical Exam  She is an mildly obese woman who does not appear in any distress.  She's oriented x3 her speech is clear her affect is bright she's alert cooperative and pleasant  She follows commands without  difficulty answers questions appropriately  Cranial nerves and coordination are intact  Reflexes are 2+ at biceps triceps and brachioradialis Hoffmann negative  Reflexes are 2+ at patellar tendons and 2+ at the Achilles tendon  There is no abnormal tone clonus or tremors  Sensory exam in the lower extremities is intact to pinprick, vibratory, light touch   Manual muscle testing in the upper extremities reveals 5/5 shoulder abductors, biceps, triceps, wrist extensors, finger flexors, intrinsics. Manual muscle testing reveals 5 over 5 strength at hip flexors, knee extensors and flexors, dorsiflexors, plantar flexors, everters and EHL  Straight leg raise is negative  She transitions easily from sitting to standing her gait is normal  She is able to walk on heels and toes  Tandem gait Romberg test are performed adequately   Cervical range of motion is within normal limits. She has a small mildly tender mobile nodule near her right sternocleidomastoid which she mentions to me as I examine her neck. Forward flexion increases low back pain mildly returned from flexion increases back pain special her right low lumbar region  Extension aggravates pain right low back and lumbosacral junction  Internal and external rotation at the hips reveals good range of motion  Palpation over left trochanter nontender  palpation over right trochanter reveals tenderness over trochanter as well as into gluteal musculature.  ORT 6         Assessment & Plan:  1. Lumbago status post flareup/L4-5 disc space narrowing noted per x-ray last month.Pain may be related to disc/facet. Patient reports overall improvement in low back pain at this time and is not interested in further medication but is interested in learning how to avoid future flareups.  Recommend physical therapy to address ergonomic issues at work, educate on proper body mechanics posture , educate on managing acute back pain, begin core strengthening moving  toward . home program.   And has not yet scheduled physical therapy and I have encouraged her to do this.   2. Right trochanteric bursitis/likely involvement in external rotators/tendonopthy:  Would like physical therapy to address muscle imbalance and strength and flexibility around hip, may use ice and ultrasound as needed, address IT band tightness.  If therapy alone does not improve may consider injection. Patient is already trialed on some NSAIDs.  Consider injection if not improving with PT.  3. H/o neck pain with recent flare up after cleaning. History of C5-C6 degen changes. Address ergonomic issues at work computer, PT, prn naprosyn  Consider trigger point injection.  4. Neck nodule-Follow up with PCP.  I will see  her back in 6 weeks to see how she's doing

## 2012-04-11 NOTE — Patient Instructions (Addendum)
Today your chief complaint was predominantly right sided neck pain.  Neck x-rays from 2008 showed degenerative changes at C5-6.  We discussed treatment options for this.  You have a prescription for flexeril and Naprosyn.  You mentioned there was a tender nodule on the right side of your neck that you will have your primary care Dr. evaluate further.   I understand you have not had a chance to start physical therapy as we discussed at the last visit but are planning to do so.  I am encouraging you to participate in physical therapy program.  I think an ergonomic assessment at work would be helpful for you as well and she spent 8 hours on a computer  I would also like to see you pace your activities a little better at home.  Followup in 4-6 weeks

## 2012-04-11 NOTE — Progress Notes (Signed)
Subjective:    Patient ID: [name], female    DOB: 1965/04/14, 47 y.o.   MRN: [id number]  Back Pain Associated symptoms include numbness.    The patient is a 47 year old woman who is kindly referred by Dr. Sanda Linger. Her chief complaint is two-fold.  Her first problem is right low back pain. She reports that she has a history of intermittent low back pain for the last year. In October her right low back pain increased significantly. Her pain remained and a rather high level up until last few weeks. The pain came on rather suddenly and has slowly been improving. Her pain is aggravated by prolonged sitting, prolonged standing also bothers her especially in the right low back region. She had x-rays 01/13/12 which showed some disc space narrowing at L4-5. She denies any problems with continued numbness or tingling although she had some at the onset in her right foot in the second and third toes. This is not persisted however. She had been treated with Flexeril Naprosyn and she takes Maxwell can Topamax for headaches.  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 She does have 3 children at home ages 20, 15 and 35.  Regarding her back since it has improved significantly at this point she is mostly interested in avoiding a flareup as she has had recently.  Her next problem is right lateral hip pain which is aggravated by walking for a longer period of time. (45 minutes to an hour) she states she's had this problem for several years but over the last 6 months it seems to have gotten worse. Again there is no numbness or weakness or tingling associated with it.  She has a long history (15 years) of bladder problems and has been treated in the past by urologists for interstitial cysititis.  Pain Inventory Average Pain 3 Pain Right Now 3 My pain is sharp  In the last 24 hours, has pain interfered with the following? General  activity 2 Relation with others 0 Enjoyment of life 0 What TIME of day is your pain at its worst? morning and evening Sleep (in general) Poor  Pain is worse with: walking and standing Pain improves with: rest and heat/ice Relief from Meds: 5  Mobility walk without assistance how many minutes can you walk? 30 ability to climb steps?  yes do you drive?  yes Do you have any goals in this area?  no  Function employed # of hrs/week 40 what is your job? [employer] work at desk Do you have any goals in this area?  no  Neuro/Psych bladder control problems numbness depression  Prior Studies Any changes since last visit?  no nerve study  Physicians involved in your care Primary care Sanda Linger   Family History  Problem Relation Age of Onset  . Diabetes Mother   . Alcohol abuse Mother   . Diabetes Father   . Heart disease Father   . Alcohol abuse Father    History   Social History  . Marital Status: Legally Separated    Spouse Name: N/A    Number of Children: N/A  . Years of Education: N/A   Social History Main Topics  . Smoking status: Never Smoker   . Smokeless tobacco: Never Used  . Alcohol Use: No  . Drug Use: No  . Sexually Active: None   Other Topics Concern  . None  Social History Narrative  . None   Past Surgical History  Procedure Laterality Date  . Appendectomy    . Laporoscopy    . Wisdom tooth extraction    . Lithotripsy     Past Medical History  Diagnosis Date  . Migraines   . Kidney stones    BP 125/79  Pulse 71  Resp 14  Ht 5\' 4"  (1.626 m)  Wt 197 lb (89.359 kg)  BMI 33.8 kg/m2  SpO2 96%     Review of Systems  Musculoskeletal: Positive for back pain.  Neurological: Positive for numbness.  Psychiatric/Behavioral: Positive for dysphoric mood.  All other systems reviewed and are negative.       Objective:   Physical Exam She is an obese woman who does not appear in any distress.  She's oriented x3 her speech is clear her  affect is bright she's alert cooperative and pleasant  She follows commands without difficulty answers questions appropriately  Cranial nerves and coordination are intact  Reflexes are 2+ at biceps triceps and brachioradialis Hoffmann negative  Reflexes are 2+ at patellar tendons and 2+ at the Achilles tendon There is no abnormal tone clonus or tremors  Sensory exam in the lower extremities is intact to pinprick, vibratory, light touch  Manual muscle testing reveals 5 over 5 strength at hip flexors, knee extensors and flexors, dorsiflexors, plantar flexors, everters and EHL  Straight leg raise is negative  She transitions easily from sitting to standing her gait is normal  She is able to walk on heels and toes  Tandem gait Romberg test are performed adequately  Forward flexion increases low back pain mildly returned from flexion increases back pain special her right low lumbar region Extension aggravates pain right low back and lumbosacral junction  Internal and external rotation at the hips reveals good range of motion  Palpation over left trochanter nontender    palpation over right trochanter reveals tenderness over trochanter as well as into gluteal musculature.   ORT 6    Assessment & Plan:  1. Lumbago status post flareup/L4-5 disc space narrowing noted per x-ray last month.Pain may be related to disc/facet. Patient reports overall improvement in low back pain at this time and is not interested in further medication but is interested in learning how to avoid future flareups.  Recommend physical therapy to address ergonomic issues at work, educate on proper body mechanics posture , educate on managing acute back pain, begin core strengthening moving toward . home program.  2. Right trochanteric bursitis/likely involvement in external rotators/tendonopthy:  Would like physical therapy to address muscle imbalance and strength and flexibility around hip, may use ice and  ultrasound as needed, address IT band tightness.  If therapy alone does not improve may consider injection. Patient is already trialed on some NSAIDs.  I will see her back in 6 weeks to see how she's doing

## 2012-04-16 ENCOUNTER — Telehealth: Payer: Self-pay | Admitting: Internal Medicine

## 2012-04-16 NOTE — Telephone Encounter (Signed)
Patient Information:  Caller Name: Jalonda  Phone: [phone]  Patient: [name]  Gender: Female  DOB: 1965-03-15  Age: 47 Years  PCP: Sanda Linger (Adults only)  Pregnant: No  Office Follow Up:  Does the office need to follow up with this patient?: Yes  Instructions For The Office: Can this pt be worked in with Dr. Sanda Linger?  She denied appt with another provider, but ahd an ED disposition and refused to go to the ED also.   Symptoms  Reason For Call & Symptoms: Pt has a migraine that started this AM and then continued to get worse.  Reviewed Health History In EMR: Yes  Reviewed Medications In EMR: Yes  Reviewed Allergies In EMR: Yes  Reviewed Surgeries / Procedures: Yes  Date of Onset of Symptoms: 04/16/2012  Treatments Tried: Claritin, Topomax and has Maxalt RX.  Treatments Tried Worked: No OB / GYN:  LMP: Unknown  Guideline(s) Used:  Headache  Disposition Per Guideline:   Go to ED Now (or to Office with PCP Approval)  Reason For Disposition Reached:   Severe pain in one eye  Advice Given:  Migraine Medication:   If your doctor has prescribed specific medication for your migraine, take it as directed as soon as the migraine starts.  RN Overrode Recommendation:  Follow Up With Office Later  Pt states she is not going to the ED.  Denied appt for today or tomorrow.  She has a RX for Maxalt that she has not filled yet, so she will get that and seeif it helps.  Instructed that if pain gets severe, vision is blurred or she gets any wore to go to the ED.

## 2012-05-14 ENCOUNTER — Ambulatory Visit (INDEPENDENT_AMBULATORY_CARE_PROVIDER_SITE_OTHER): Payer: BC Managed Care – PPO | Admitting: Internal Medicine

## 2012-05-14 ENCOUNTER — Encounter: Payer: Self-pay | Admitting: Internal Medicine

## 2012-05-14 VITALS — BP 114/80 | HR 87 | Temp 97.7°F | Resp 16 | Wt 197.5 lb

## 2012-05-14 DIAGNOSIS — R209 Unspecified disturbances of skin sensation: Secondary | ICD-10-CM

## 2012-05-14 DIAGNOSIS — R200 Anesthesia of skin: Secondary | ICD-10-CM

## 2012-05-14 DIAGNOSIS — G43909 Migraine, unspecified, not intractable, without status migrainosus: Secondary | ICD-10-CM | POA: Insufficient documentation

## 2012-05-14 MED ORDER — RIBOFLAVIN 400 MG PO TABS: 1.0000 | ORAL_TABLET | Freq: Every day | ORAL | Status: DC

## 2012-05-14 MED ORDER — AMITRIPTYLINE HCL 10 MG PO TABS: 10.0000 mg | ORAL_TABLET | Freq: Every day | ORAL | Status: DC

## 2012-05-14 NOTE — Progress Notes (Signed)
Subjective:    Patient ID: [name], female    DOB: 22-Feb-1965, 47 y.o.   MRN: [id number]  Migraine  This is a chronic problem. The current episode started more than 1 year ago. The problem occurs daily. The problem has been gradually worsening. The pain is located in the bilateral region. The pain does not radiate. The pain quality is similar to prior headaches. Quality: pounding frontal headaches intermittently. The pain is at a severity of 4/10. The pain is mild. Associated symptoms include insomnia, phonophobia and photophobia. Pertinent negatives include no abdominal pain, abnormal behavior, anorexia, back pain, blurred vision, coughing, dizziness, drainage, ear pain, eye pain, eye redness, eye watering, facial sweating, fever, hearing loss, loss of balance, muscle aches, nausea, neck pain, numbness, rhinorrhea, scalp tenderness, seizures, sinus pressure, sore throat, swollen glands, tingling, tinnitus, visual change, vomiting, weakness or weight loss. She has tried triptans and antidepressants for the symptoms. The treatment provided mild relief. Her past medical history is significant for migraine headaches and migraines in the family. There is no history of cancer, cluster headaches, hypertension, immunosuppression, obesity, pseudotumor cerebri, recent head traumas, sinus disease or TMJ.      Review of Systems  Constitutional: Negative.  Negative for fever, chills, weight loss, diaphoresis, activity change, appetite change, fatigue and unexpected weight change.  HENT: Negative for hearing loss, ear pain, sore throat, rhinorrhea, neck pain, sinus pressure and tinnitus.   Eyes: Positive for photophobia. Negative for blurred vision, pain and redness.  Respiratory: Negative.  Negative for cough.   Cardiovascular: Negative.   Gastrointestinal: Negative.  Negative for nausea, vomiting, abdominal pain and anorexia.  Endocrine: Negative.   Genitourinary: Negative.   Musculoskeletal:  Negative.  Negative for back pain.  Skin: Negative.   Allergic/Immunologic: Negative.   Neurological: Positive for headaches. Negative for dizziness, tingling, tremors, seizures, syncope, facial asymmetry, speech difficulty, weakness, light-headedness, numbness and loss of balance.  Hematological: Negative.  Negative for adenopathy. Does not bruise/bleed easily.  Psychiatric/Behavioral: The patient has insomnia.        Objective:   Physical Exam  Vitals reviewed. Constitutional: She is oriented to person, place, and time. She appears well-developed and well-nourished. No distress.  HENT:  Head: Atraumatic.  Mouth/Throat: Oropharynx is clear and moist. No oropharyngeal exudate.  Eyes: Conjunctivae and EOM are normal. Pupils are equal, round, and reactive to light. Right eye exhibits no discharge. Left eye exhibits no discharge. No scleral icterus.  Neck: Normal range of motion. Neck supple. No JVD present. No tracheal deviation present. No thyromegaly present.  Cardiovascular: Normal rate, regular rhythm, normal heart sounds and intact distal pulses.  Exam reveals no gallop and no friction rub.   No murmur heard. Pulmonary/Chest: Effort normal and breath sounds normal. No stridor. No respiratory distress. She has no wheezes. She has no rales. She exhibits no tenderness.  Abdominal: Soft. Bowel sounds are normal. She exhibits no distension and no mass. There is no tenderness. There is no rebound and no guarding.  Musculoskeletal: Normal range of motion. She exhibits no edema and no tenderness.  Lymphadenopathy:    She has no cervical adenopathy.  Neurological: She is alert and oriented to person, place, and time. She has normal strength. She displays no atrophy, no tremor and normal reflexes. No cranial nerve deficit or sensory deficit. She exhibits normal muscle tone. She displays a negative Romberg sign. She displays no seizure activity. Coordination and gait normal. She displays no  Babinski's sign on the right side. She displays  no Babinski's sign on the left side.  Reflex Scores:      Tricep reflexes are 1+ on the right side and 1+ on the left side.      Bicep reflexes are 1+ on the right side and 1+ on the left side.      Brachioradialis reflexes are 1+ on the right side and 1+ on the left side.      Patellar reflexes are 1+ on the right side and 1+ on the left side.      Achilles reflexes are 1+ on the right side and 1+ on the left side. Skin: Skin is warm and dry. No rash noted. She is not diaphoretic. No erythema. No pallor.  Psychiatric: She has a normal mood and affect. Her behavior is normal. Judgment and thought content normal.      Lab Results  Component Value Date   WBC 9.1 01/13/2012   HGB 13.9 01/13/2012   HCT 41.7 01/13/2012   PLT 220.0 01/13/2012   GLUCOSE 87 01/13/2012   CHOL 152 01/13/2012   TRIG 106.0 01/13/2012   HDL 41.70 01/13/2012   LDLCALC 89 01/13/2012   ALT 17 01/13/2012   AST 15 01/13/2012   NA 140 01/13/2012   K 3.9 01/13/2012   CL 110 01/13/2012   CREATININE 0.8 01/13/2012   BUN 11 01/13/2012   CO2 24 01/13/2012   TSH 2.27 01/13/2012   HGBA1C 5.3 01/13/2012      Assessment & Plan:

## 2012-05-14 NOTE — Assessment & Plan Note (Signed)
This has resolved.

## 2012-05-14 NOTE — Patient Instructions (Signed)
Migraine Headache A migraine headache is an intense, throbbing pain on one or both sides of your head. A migraine can last for 30 minutes to several hours. CAUSES  The exact cause of a migraine headache is not always known. However, a migraine may be caused when nerves in the brain become irritated and release chemicals that cause inflammation. This causes pain. SYMPTOMS  Pain on one or both sides of your head.  Pulsating or throbbing pain.  Severe pain that prevents daily activities.  Pain that is aggravated by any physical activity.  Nausea, vomiting, or both.  Dizziness.  Pain with exposure to bright lights, loud noises, or activity.  General sensitivity to bright lights, loud noises, or smells. Before you get a migraine, you may get warning signs that a migraine is coming (aura). An aura may include:  Seeing flashing lights.  Seeing bright spots, halos, or zig-zag lines.  Having tunnel vision or blurred vision.  Having feelings of numbness or tingling.  Having trouble talking.  Having muscle weakness. MIGRAINE TRIGGERS  Alcohol.  Smoking.  Stress.  Menstruation.  Aged cheeses.  Foods or drinks that contain nitrates, glutamate, aspartame, or tyramine.  Lack of sleep.  Chocolate.  Caffeine.  Hunger.  Physical exertion.  Fatigue.  Medicines used to treat chest pain (nitroglycerine), birth control pills, estrogen, and some blood pressure medicines. DIAGNOSIS  A migraine headache is often diagnosed based on:  Symptoms.  Physical examination.  A CT scan or MRI of your head. TREATMENT Medicines may be given for pain and nausea. Medicines can also be given to help prevent recurrent migraines.  HOME CARE INSTRUCTIONS  Only take over-the-counter or prescription medicines for pain or discomfort as directed by your caregiver. The use of long-term narcotics is not recommended.  Lie down in a dark, quiet room when you have a migraine.  Keep a journal  to find out what may trigger your migraine headaches. For example, write down:  What you eat and drink.  How much sleep you get.  Any change to your diet or medicines.  Limit alcohol consumption.  Quit smoking if you smoke.  Get 7 to 9 hours of sleep, or as recommended by your caregiver.  Limit stress.  Keep lights dim if bright lights bother you and make your migraines worse. SEEK IMMEDIATE MEDICAL CARE IF:   Your migraine becomes severe.  You have a fever.  You have a stiff neck.  You have vision loss.  You have muscular weakness or loss of muscle control.  You start losing your balance or have trouble walking.  You feel faint or pass out.  You have severe symptoms that are different from your first symptoms. MAKE SURE YOU:   Understand these instructions.  Will watch your condition.  Will get help right away if you are not doing well or get worse. Document Released: 01/24/2005 Document Revised: 04/18/2011 Document Reviewed: 01/14/2011 [employer] Patient Information 2013 [employer].  

## 2012-05-14 NOTE — Assessment & Plan Note (Signed)
She is having more frequent but not more severe or unusual headaches She will continue maxalt as needed and topamax at the current dose I have asked her to add riboflavin (high dose) and elavil for additional prophylaxis, the elavil will help with the insomnia as well

## 2012-05-16 ENCOUNTER — Encounter: Payer: BC Managed Care – PPO | Admitting: Physical Medicine and Rehabilitation

## 2012-06-20 ENCOUNTER — Encounter: Payer: BC Managed Care – PPO | Admitting: Physical Medicine and Rehabilitation

## 2012-06-25 ENCOUNTER — Other Ambulatory Visit: Payer: Self-pay | Admitting: Internal Medicine

## 2012-06-26 ENCOUNTER — Other Ambulatory Visit: Payer: Self-pay

## 2012-06-26 MED ORDER — RIZATRIPTAN BENZOATE 10 MG PO TABS: 10.0000 mg | ORAL_TABLET | ORAL | Status: DC | PRN

## 2012-08-14 ENCOUNTER — Encounter: Payer: Self-pay | Admitting: Internal Medicine

## 2012-08-14 ENCOUNTER — Ambulatory Visit (INDEPENDENT_AMBULATORY_CARE_PROVIDER_SITE_OTHER): Payer: BC Managed Care – PPO | Admitting: Internal Medicine

## 2012-08-14 VITALS — BP 108/68 | HR 85 | Temp 98.0°F | Resp 16 | Wt 201.8 lb

## 2012-08-14 DIAGNOSIS — J209 Acute bronchitis, unspecified: Secondary | ICD-10-CM | POA: Insufficient documentation

## 2012-08-14 MED ORDER — AZITHROMYCIN 500 MG PO TABS: 500.0000 mg | ORAL_TABLET | Freq: Every day | ORAL | Status: DC

## 2012-08-14 MED ORDER — HYDROCODONE-HOMATROPINE 5-1.5 MG/5ML PO SYRP: 5.0000 mL | ORAL_SOLUTION | Freq: Three times a day (TID) | ORAL | Status: DC | PRN

## 2012-08-14 NOTE — Assessment & Plan Note (Signed)
Will treat the infection with and control the cough with a suppressant

## 2012-08-14 NOTE — Patient Instructions (Signed)

## 2012-08-14 NOTE — Progress Notes (Signed)
Subjective:    Patient ID: [name], female    DOB: [birth year], 47 y.o.   MRN: [id number]  Cough This is a recurrent problem. The current episode started 1 to 4 weeks ago. The problem has been gradually worsening. The problem occurs every few hours. The cough is productive of purulent sputum. Associated symptoms include chills, ear pain, a fever and a sore throat. Pertinent negatives include no chest pain, ear congestion, headaches, heartburn, hemoptysis, myalgias, nasal congestion, postnasal drip, rash, rhinorrhea, shortness of breath, sweats, weight loss or wheezing. She has tried OTC cough suppressant for the symptoms. The treatment provided mild relief.      Review of Systems  Constitutional: Positive for fever and chills. Negative for weight loss, diaphoresis, activity change, appetite change, fatigue and unexpected weight change.  HENT: Positive for ear pain and sore throat. Negative for hearing loss, rhinorrhea, trouble swallowing, voice change, postnasal drip, sinus pressure and ear discharge.   Eyes: Negative.   Respiratory: Positive for cough. Negative for hemoptysis, shortness of breath and wheezing.   Cardiovascular: Negative.  Negative for chest pain, palpitations and leg swelling.  Gastrointestinal: Negative.  Negative for heartburn.  Endocrine: Negative.   Genitourinary: Negative.   Musculoskeletal: Negative.  Negative for myalgias.  Skin: Negative.  Negative for rash.  Allergic/Immunologic: Negative.   Neurological: Negative.  Negative for dizziness, weakness, light-headedness and headaches.  Hematological: Negative.  Negative for adenopathy. Does not bruise/bleed easily.  Psychiatric/Behavioral: Negative.        Objective:   Physical Exam  Vitals reviewed. Constitutional: She is oriented to person, place, and time. She appears well-developed and well-nourished.  Non-toxic appearance. She does not have a sickly appearance. She does not appear ill. No distress.   HENT:  Head: Normocephalic and atraumatic.  Mouth/Throat: Oropharynx is clear and moist. No oropharyngeal exudate.  Eyes: Conjunctivae are normal. Right eye exhibits no discharge. Left eye exhibits no discharge. No scleral icterus.  Neck: Normal range of motion. Neck supple. No JVD present. No tracheal deviation present. No thyromegaly present.  Cardiovascular: Normal rate, regular rhythm, normal heart sounds and intact distal pulses.  Exam reveals no gallop and no friction rub.   No murmur heard. Pulmonary/Chest: Effort normal and breath sounds normal. No stridor. No respiratory distress. She has no wheezes. She has no rales. She exhibits no tenderness.  Abdominal: Soft. Bowel sounds are normal. She exhibits no distension and no mass. There is no tenderness. There is no rebound and no guarding.  Musculoskeletal: Normal range of motion. She exhibits no edema and no tenderness.  Lymphadenopathy:    She has no cervical adenopathy.  Neurological: She is oriented to person, place, and time.  Skin: Skin is warm and dry. No rash noted. She is not diaphoretic. No erythema. No pallor.  Psychiatric: She has a normal mood and affect. Her behavior is normal. Judgment and thought content normal.     Lab Results  Component Value Date   WBC 9.1 01/13/2012   HGB 13.9 01/13/2012   HCT 41.7 01/13/2012   PLT 220.0 01/13/2012   GLUCOSE 87 01/13/2012   CHOL 152 01/13/2012   TRIG 106.0 01/13/2012   HDL 41.70 01/13/2012   LDLCALC 89 01/13/2012   ALT 17 01/13/2012   AST 15 01/13/2012   NA 140 01/13/2012   K 3.9 01/13/2012   CL 110 01/13/2012   CREATININE 0.8 01/13/2012   BUN 11 01/13/2012   CO2 24 01/13/2012   TSH 2.27 01/13/2012   HGBA1C 5.3  01/13/2012       Assessment & Plan:

## 2012-08-21 ENCOUNTER — Encounter: Payer: Self-pay | Admitting: Internal Medicine

## 2012-08-21 ENCOUNTER — Ambulatory Visit (INDEPENDENT_AMBULATORY_CARE_PROVIDER_SITE_OTHER): Payer: BC Managed Care – PPO | Admitting: Internal Medicine

## 2012-08-21 ENCOUNTER — Ambulatory Visit (INDEPENDENT_AMBULATORY_CARE_PROVIDER_SITE_OTHER)
Admission: RE | Admit: 2012-08-21 | Discharge: 2012-08-21 | Disposition: A | Discharge status: Home / Self Care | Payer: BC Managed Care – PPO | Source: Ambulatory Visit | Attending: Internal Medicine | Admitting: Internal Medicine

## 2012-08-21 VITALS — BP 106/80 | HR 71 | Temp 97.0°F | Resp 16 | Wt 202.5 lb

## 2012-08-21 DIAGNOSIS — R05 Cough: Secondary | ICD-10-CM

## 2012-08-21 DIAGNOSIS — J209 Acute bronchitis, unspecified: Secondary | ICD-10-CM

## 2012-08-21 DIAGNOSIS — R059 Cough, unspecified: Secondary | ICD-10-CM

## 2012-08-21 MED ORDER — MOMETASONE FUROATE 220 MCG/INH IN AEPB: 1.0000 | INHALATION_SPRAY | Freq: Two times a day (BID) | RESPIRATORY_TRACT | Status: DC

## 2012-08-21 NOTE — Progress Notes (Signed)
Subjective:    Patient ID: [name], female    DOB: 06-Jul-1965, 47 y.o.   MRN: [id number]  Cough This is a recurrent problem. The current episode started 1 to 4 weeks ago. The problem has been unchanged. The problem occurs every few hours. The cough is non-productive. Pertinent negatives include no chest pain, chills, ear congestion, ear pain, fever, headaches, heartburn, hemoptysis, myalgias, nasal congestion, postnasal drip, rash, rhinorrhea, sore throat, shortness of breath, sweats, weight loss or wheezing. Nothing aggravates the symptoms. She has tried prescription cough suppressant for the symptoms. The treatment provided moderate relief. Her past medical history is significant for environmental allergies. There is no history of asthma, bronchiectasis, bronchitis, COPD, emphysema or pneumonia.      Review of Systems  Constitutional: Negative.  Negative for fever, chills, weight loss, diaphoresis, activity change, appetite change, fatigue and unexpected weight change.  HENT: Negative.  Negative for ear pain, nosebleeds, congestion, sore throat, facial swelling, rhinorrhea, sneezing, trouble swallowing, postnasal drip and sinus pressure.   Eyes: Negative.   Respiratory: Positive for cough. Negative for hemoptysis, choking, shortness of breath, wheezing and stridor.   Cardiovascular: Negative.  Negative for chest pain, palpitations and leg swelling.  Gastrointestinal: Negative.  Negative for heartburn and abdominal pain.  Endocrine: Negative.   Genitourinary: Negative.   Musculoskeletal: Negative.  Negative for myalgias.  Skin: Negative.  Negative for rash.  Allergic/Immunologic: Positive for environmental allergies. Negative for food allergies and immunocompromised state.  Neurological: Negative.  Negative for dizziness, light-headedness and headaches.  Hematological: Negative.  Negative for adenopathy. Does not bruise/bleed easily.  Psychiatric/Behavioral: Negative.         Objective:   Physical Exam  Vitals reviewed. Constitutional: She is oriented to person, place, and time. She appears well-developed and well-nourished.  Non-toxic appearance. She does not have a sickly appearance. She does not appear ill. She appears distressed (coughing intermittently, no distress).  HENT:  Head: Normocephalic and atraumatic.  Mouth/Throat: Oropharynx is clear and moist. No oropharyngeal exudate.  Eyes: Conjunctivae are normal. Right eye exhibits no discharge. Left eye exhibits no discharge. No scleral icterus.  Neck: Normal range of motion. Neck supple. No JVD present. No tracheal deviation present. No thyromegaly present.  Cardiovascular: Normal rate, regular rhythm, normal heart sounds and intact distal pulses.  Exam reveals no gallop and no friction rub.   No murmur heard. Pulmonary/Chest: Effort normal and breath sounds normal. No stridor. No respiratory distress. She has no wheezes. She has no rales. She exhibits no tenderness.  Abdominal: Soft. Bowel sounds are normal. She exhibits no distension and no mass. There is no tenderness. There is no rebound and no guarding.  Musculoskeletal: Normal range of motion. She exhibits no edema and no tenderness.  Lymphadenopathy:    She has no cervical adenopathy.  Neurological: She is oriented to person, place, and time.  Skin: Skin is warm and dry. No rash noted. She is not diaphoretic. No erythema. No pallor.  Psychiatric: She has a normal mood and affect. Her behavior is normal. Judgment and thought content normal.     Lab Results  Component Value Date   WBC 9.1 01/13/2012   HGB 13.9 01/13/2012   HCT 41.7 01/13/2012   PLT 220.0 01/13/2012   GLUCOSE 87 01/13/2012   CHOL 152 01/13/2012   TRIG 106.0 01/13/2012   HDL 41.70 01/13/2012   LDLCALC 89 01/13/2012   ALT 17 01/13/2012   AST 15 01/13/2012   NA 140 01/13/2012   K 3.9  01/13/2012   CL 110 01/13/2012   CREATININE 0.8 01/13/2012   BUN 11 01/13/2012   CO2 24 01/13/2012   TSH  2.27 01/13/2012   HGBA1C 5.3 01/13/2012       Assessment & Plan:

## 2012-08-21 NOTE — Assessment & Plan Note (Signed)
I think she has a post-viral URI cough and have asked her to start asmanex

## 2012-08-21 NOTE — Assessment & Plan Note (Signed)
I will check her Chest Xray to see if she has pna, mass, edema

## 2012-08-21 NOTE — Patient Instructions (Signed)

## 2012-09-04 ENCOUNTER — Encounter: Payer: Self-pay | Admitting: Internal Medicine

## 2012-09-04 ENCOUNTER — Ambulatory Visit (INDEPENDENT_AMBULATORY_CARE_PROVIDER_SITE_OTHER): Payer: BC Managed Care – PPO | Admitting: Internal Medicine

## 2012-09-04 VITALS — BP 112/80 | HR 73 | Temp 97.7°F | Resp 16 | Wt 204.0 lb

## 2012-09-04 DIAGNOSIS — J309 Allergic rhinitis, unspecified: Secondary | ICD-10-CM

## 2012-09-04 MED ORDER — MOMETASONE FUROATE 50 MCG/ACT NA SUSP: 4.0000 | Freq: Every day | NASAL | Status: DC

## 2012-09-04 NOTE — Progress Notes (Signed)
  Subjective:    Patient ID: [name], female    DOB: 1965-08-07, 47 y.o.   MRN: [id number]  HPI  She returns c/o post-nasal drip and congestion. Claritin has helped some. She has this year round.  Review of Systems  Constitutional: Negative.  Negative for fever, chills, diaphoresis, appetite change and fatigue.  HENT: Positive for congestion and rhinorrhea. Negative for ear pain, nosebleeds, sore throat, facial swelling, sneezing, trouble swallowing, neck pain, neck stiffness, voice change, sinus pressure, tinnitus and ear discharge.   Eyes: Negative.   Respiratory: Negative.  Negative for cough, chest tightness, shortness of breath, wheezing and stridor.   Cardiovascular: Negative.  Negative for chest pain, palpitations and leg swelling.  Gastrointestinal: Negative.  Negative for nausea, vomiting, abdominal pain, diarrhea, constipation and blood in stool.  Endocrine: Negative.   Genitourinary: Negative.   Musculoskeletal: Negative.  Negative for arthralgias.  Skin: Negative.   Allergic/Immunologic: Positive for environmental allergies. Negative for food allergies and immunocompromised state.  Neurological: Negative.  Negative for dizziness, weakness, light-headedness and headaches.  Hematological: Negative.  Negative for adenopathy. Does not bruise/bleed easily.  Psychiatric/Behavioral: Negative.        Objective:   Physical Exam  Vitals reviewed. Constitutional: She is oriented to person, place, and time. She appears well-developed and well-nourished. No distress.  HENT:  Head: Normocephalic and atraumatic.  Right Ear: Hearing, tympanic membrane, external ear and ear canal normal.  Left Ear: Hearing, tympanic membrane, external ear and ear canal normal.  Nose: Mucosal edema and rhinorrhea present. No nose lacerations, sinus tenderness, nasal deformity, septal deviation or nasal septal hematoma. No epistaxis.  No foreign bodies. Right sinus exhibits no maxillary sinus  tenderness and no frontal sinus tenderness. Left sinus exhibits no maxillary sinus tenderness and no frontal sinus tenderness.  Mouth/Throat: Oropharynx is clear and moist and mucous membranes are normal. Mucous membranes are not pale, not dry and not cyanotic. No oropharyngeal exudate, posterior oropharyngeal edema, posterior oropharyngeal erythema or tonsillar abscesses.  Eyes: Conjunctivae are normal. Right eye exhibits no discharge. Left eye exhibits no discharge. No scleral icterus.  Neck: Normal range of motion. Neck supple. No JVD present. No tracheal deviation present. No thyromegaly present.  Cardiovascular: Normal rate, regular rhythm, normal heart sounds and intact distal pulses.  Exam reveals no gallop and no friction rub.   No murmur heard. Pulmonary/Chest: Effort normal and breath sounds normal. No stridor. No respiratory distress. She has no wheezes. She has no rales. She exhibits no tenderness.  Abdominal: Soft. Bowel sounds are normal. She exhibits no distension and no mass. There is no tenderness. There is no rebound and no guarding.  Musculoskeletal: Normal range of motion. She exhibits no edema and no tenderness.  Lymphadenopathy:    She has no cervical adenopathy.  Neurological: She is oriented to person, place, and time.  Skin: Skin is warm and dry. No rash noted. She is not diaphoretic. No erythema. No pallor.  Psychiatric: She has a normal mood and affect. Her behavior is normal. Judgment and thought content normal.          Assessment & Plan:

## 2012-09-04 NOTE — Assessment & Plan Note (Signed)
Continue claritin Start nasonex ns

## 2012-09-04 NOTE — Patient Instructions (Signed)
Allergic Rhinitis Allergic rhinitis is when the mucous membranes in the nose respond to allergens. Allergens are particles in the air that cause your body to have an allergic reaction. This causes you to release allergic antibodies. Through a chain of events, these eventually cause you to release histamine into the blood stream (hence the use of antihistamines). Although meant to be protective to the body, it is this release that causes your discomfort, such as frequent sneezing, congestion and an itchy runny nose.  CAUSES  The pollen allergens may come from grasses, trees, and weeds. This is seasonal allergic rhinitis, or "hay fever." Other allergens cause year-round allergic rhinitis (perennial allergic rhinitis) such as house dust mite allergen, pet dander and mold spores.  SYMPTOMS   Nasal stuffiness (congestion).  Runny, itchy nose with sneezing and tearing of the eyes.  There is often an itching of the mouth, eyes and ears. It cannot be cured, but it can be controlled with medications. DIAGNOSIS  If you are unable to determine the offending allergen, skin or blood testing may find it. TREATMENT   Avoid the allergen.  Medications and allergy shots (immunotherapy) can help.  Hay fever may often be treated with antihistamines in pill or nasal spray forms. Antihistamines block the effects of histamine. There are over-the-counter medicines that may help with nasal congestion and swelling around the eyes. Check with your caregiver before taking or giving this medicine. If the treatment above does not work, there are many new medications your caregiver can prescribe. Stronger medications may be used if initial measures are ineffective. Desensitizing injections can be used if medications and avoidance fails. Desensitization is when a patient is given ongoing shots until the body becomes less sensitive to the allergen. Make sure you follow up with your caregiver if problems continue. SEEK MEDICAL  CARE IF:   You develop fever (more than 100.5 F (38.1 C).  You develop a cough that does not stop easily (persistent).  You have shortness of breath.  You start wheezing.  Symptoms interfere with normal daily activities. Document Released: 10/19/2000 Document Revised: 04/18/2011 Document Reviewed: 04/30/2008 [employer] Patient Information 2014 [employer].  

## 2012-10-30 ENCOUNTER — Other Ambulatory Visit: Payer: Self-pay

## 2012-10-30 MED ORDER — TOPIRAMATE 50 MG PO TABS: ORAL_TABLET | ORAL | Status: DC

## 2012-11-29 ENCOUNTER — Ambulatory Visit (INDEPENDENT_AMBULATORY_CARE_PROVIDER_SITE_OTHER): Payer: BC Managed Care – PPO | Admitting: Internal Medicine

## 2012-11-29 ENCOUNTER — Encounter: Payer: Self-pay | Admitting: Internal Medicine

## 2012-11-29 VITALS — BP 120/84 | HR 82 | Temp 98.3°F | Ht 64.0 in | Wt 209.0 lb

## 2012-11-29 DIAGNOSIS — B9789 Other viral agents as the cause of diseases classified elsewhere: Secondary | ICD-10-CM

## 2012-11-29 DIAGNOSIS — B349 Viral infection, unspecified: Secondary | ICD-10-CM

## 2012-11-29 DIAGNOSIS — R112 Nausea with vomiting, unspecified: Secondary | ICD-10-CM

## 2012-11-29 MED ORDER — ONDANSETRON 4 MG PO TBDP: 4.0000 mg | ORAL_TABLET | Freq: Three times a day (TID) | ORAL | Status: DC | PRN

## 2012-11-29 NOTE — Patient Instructions (Signed)

## 2012-11-29 NOTE — Progress Notes (Signed)
Subjective:    Patient ID: [name], female    DOB: 1965/06/08, 47 y.o.   MRN: [id number]  HPI  Pt presents to the clinic today with c/o nausea, vomiting and fever. This started 4 days ago. She has been trying to eat bland foods and drink plenty of fluids. She has not vomited in the last 24 hours but has been extremely nauseated. She has not taken anything for the nausea. She denies diarrhea or blood in her stool.  Review of Systems      Past Medical History  Diagnosis Date  . Migraines   . Kidney stones     Current Outpatient Prescriptions  Medication Sig Dispense Refill  . amitriptyline (ELAVIL) 10 MG tablet Take 1 tablet (10 mg total) by mouth at bedtime.  90 tablet  3  . cyclobenzaprine (FLEXERIL) 10 MG tablet Take 10 mg by mouth 3 (three) times daily as needed for muscle spasms.      [name] loratadine (CLARITIN) 10 MG tablet Take 10 mg by mouth as needed.      . mometasone (NASONEX) 50 MCG/ACT nasal spray Place 4 sprays into the nose daily.  17 g  12  . Riboflavin 400 MG TABS Take 1 tablet by mouth daily.  90 tablet  3  . rizatriptan (MAXALT) 10 MG tablet Take 1 tablet (10 mg total) by mouth as needed. For migraines. May repeat in 2 hours if needed  10 tablet  4  . topiramate (TOPAMAX) 50 MG tablet take 1 tablet by mouth twice a day  180 tablet  1  . venlafaxine XR (EFFEXOR-XR) 150 MG 24 hr capsule Take 1 capsule (150 mg total) by mouth daily.  90 capsule  3   No current facility-administered medications for this visit.    Allergies  Allergen Reactions  . Sulfa Antibiotics Nausea And Vomiting  . Sulfacetamide Sodium     Family History  Problem Relation Age of Onset  . Diabetes Mother   . Alcohol abuse Mother   . Diabetes Father   . Heart disease Father   . Alcohol abuse Father   . Cancer Neg Hx   . Stroke Neg Hx   . Hyperlipidemia Neg Hx   . Hypertension Neg Hx   . Kidney disease Neg Hx   . Arthritis Neg Hx     History   Social History  . Marital Status:  Legally Separated    Spouse Name: N/A    Number of Children: N/A  . Years of Education: N/A   Occupational History  . Not on file.   Social History Main Topics  . Smoking status: Never Smoker   . Smokeless tobacco: Never Used  . Alcohol Use: No  . Drug Use: No  . Sexual Activity: Not Currently    Birth Control/ Protection: IUD   Other Topics Concern  . Not on file   Social History Narrative  . No narrative on file     Constitutional: Pt reports fever. Denies malaise, fatigue, headache or abrupt weight changes.  Respiratory: Denies difficulty breathing, shortness of breath, cough or sputum production.   Cardiovascular: Denies chest pain, chest tightness, palpitations or swelling in the hands or feet.  Gastrointestinal: Pt reports nausea and vomiting. Denies abdominal pain, bloating, constipation, diarrhea or blood in the stool.    No other specific complaints in a complete review of systems (except as listed in HPI above).  Objective:   Physical Exam  BP 120/84  Pulse 82  Temp(Src) 98.3 F (36.8 C) (Oral)  Ht 5\' 4"  (1.626 m)  Wt 209 lb (94.802 kg)  BMI 35.86 kg/m2  SpO2 98% Wt Readings from Last 3 Encounters:  11/29/12 209 lb (94.802 kg)  09/04/12 204 lb (92.534 kg)  08/21/12 202 lb 8 oz (91.853 kg)    General: Appears her stated age, well developed, well nourished in NAD. Cardiovascular: Normal rate and rhythm. S1,S2 noted.  No murmur, rubs or gallops noted. No JVD or BLE edema. No carotid bruits noted. Pulmonary/Chest: Normal effort and positive vesicular breath sounds. No respiratory distress. No wheezes, rales or ronchi noted.  Abdomen: Soft and nontender. Normal bowel sounds, no bruits noted. No distention or masses noted. Liver, spleen and kidneys non palpable.   BMET    Component Value Date/Time   NA 140 01/13/2012 1543   K 3.9 01/13/2012 1543   CL 110 01/13/2012 1543   CO2 24 01/13/2012 1543   GLUCOSE 87 01/13/2012 1543   BUN 11 01/13/2012 1543    CREATININE 0.8 01/13/2012 1543   CALCIUM 8.8 01/13/2012 1543    Lipid Panel     Component Value Date/Time   CHOL 152 01/13/2012 1543   TRIG 106.0 01/13/2012 1543   HDL 41.70 01/13/2012 1543   CHOLHDL 4 01/13/2012 1543   VLDL 21.2 01/13/2012 1543   LDLCALC 89 01/13/2012 1543    CBC    Component Value Date/Time   WBC 9.1 01/13/2012 1543   RBC 4.61 01/13/2012 1543   HGB 13.9 01/13/2012 1543   HCT 41.7 01/13/2012 1543   PLT 220.0 01/13/2012 1543   MCV 90.6 01/13/2012 1543   MCHC 33.4 01/13/2012 1543   RDW 13.1 01/13/2012 1543   LYMPHSABS 2.1 01/13/2012 1543   MONOABS 0.6 01/13/2012 1543   EOSABS 0.1 01/13/2012 1543   BASOSABS 0.0 01/13/2012 1543    Hgb A1C Lab Results  Component Value Date   HGBA1C 5.3 01/13/2012         Assessment & Plan:   Nausea and vomiting, likely viral:  Drink plenty of fluids to avoid dehydration erx for zofran odt Make sure to wash your hands well to prevent spreading the virus  RTC as needed or if symptoms no better by monday

## 2012-12-05 DIAGNOSIS — Z975 Presence of (intrauterine) contraceptive device: Secondary | ICD-10-CM | POA: Insufficient documentation

## 2013-02-12 ENCOUNTER — Other Ambulatory Visit: Payer: Self-pay | Admitting: Obstetrics and Gynecology

## 2013-02-12 DIAGNOSIS — Z1231 Encounter for screening mammogram for malignant neoplasm of breast: Secondary | ICD-10-CM

## 2013-02-18 ENCOUNTER — Ambulatory Visit: Payer: BC Managed Care – PPO

## 2013-02-18 ENCOUNTER — Ambulatory Visit
Admission: RE | Admit: 2013-02-18 | Discharge: 2013-02-18 | Disposition: A | Discharge status: Home / Self Care | Payer: BC Managed Care – PPO | Source: Ambulatory Visit | Attending: Obstetrics and Gynecology | Admitting: Obstetrics and Gynecology

## 2013-02-18 DIAGNOSIS — Z1231 Encounter for screening mammogram for malignant neoplasm of breast: Secondary | ICD-10-CM

## 2013-04-26 ENCOUNTER — Ambulatory Visit: Payer: BC Managed Care – PPO | Admitting: Psychology

## 2013-06-14 ENCOUNTER — Encounter: Payer: Self-pay | Admitting: Family Medicine

## 2013-06-14 ENCOUNTER — Ambulatory Visit (INDEPENDENT_AMBULATORY_CARE_PROVIDER_SITE_OTHER): Payer: BC Managed Care – PPO | Admitting: Family Medicine

## 2013-06-14 VITALS — BP 131/82 | HR 64 | Temp 98.4°F | Ht 64.0 in | Wt 220.0 lb

## 2013-06-14 DIAGNOSIS — IMO0002 Reserved for concepts with insufficient information to code with codable children: Secondary | ICD-10-CM

## 2013-06-14 MED ORDER — CEPHALEXIN 500 MG PO CAPS: 500.0000 mg | ORAL_CAPSULE | Freq: Three times a day (TID) | ORAL | Status: AC

## 2013-06-14 NOTE — Progress Notes (Signed)
Pre visit review using our clinic review tool, if applicable. No additional management support is needed unless otherwise documented below in the visit note. 

## 2013-06-14 NOTE — Progress Notes (Signed)
   Subjective:    Patient ID: Melanie DredgeJennifer A Wiggs, female    DOB: 12/07/1965, 48 y.o.   MRN: [id number]  HPI Here for 3 days of pain and swelling on the left great toe. She tried to clip a hangnail in this area and dug too deep. Using peroxide to clean it.    Review of Systems  Constitutional: Negative.   Skin: Positive for wound.       Objective:   Physical Exam  Constitutional: She appears well-developed and well-nourished.  Skin:  Left great toe is red and swollen and tender at the nail edge. No purulence can be expressed           Assessment & Plan:  Stop the peroxide and soak with hot water and Epsom salts daily. Dress with Neosporin. Use Keflex for 10 days

## 2013-06-27 ENCOUNTER — Other Ambulatory Visit: Payer: Self-pay | Admitting: Internal Medicine

## 2013-10-03 ENCOUNTER — Other Ambulatory Visit: Payer: Self-pay | Admitting: Internal Medicine

## 2013-10-17 ENCOUNTER — Ambulatory Visit (INDEPENDENT_AMBULATORY_CARE_PROVIDER_SITE_OTHER): Payer: BC Managed Care – PPO | Admitting: Internal Medicine

## 2013-10-17 ENCOUNTER — Ambulatory Visit (INDEPENDENT_AMBULATORY_CARE_PROVIDER_SITE_OTHER)
Admission: RE | Admit: 2013-10-17 | Discharge: 2013-10-17 | Disposition: A | Discharge status: Home / Self Care | Payer: BC Managed Care – PPO | Source: Ambulatory Visit | Attending: Internal Medicine | Admitting: Internal Medicine

## 2013-10-17 ENCOUNTER — Encounter: Payer: Self-pay | Admitting: Internal Medicine

## 2013-10-17 VITALS — BP 120/80 | HR 69 | Temp 98.2°F | Wt 223.5 lb

## 2013-10-17 DIAGNOSIS — M79672 Pain in left foot: Secondary | ICD-10-CM

## 2013-10-17 DIAGNOSIS — M79609 Pain in unspecified limb: Secondary | ICD-10-CM

## 2013-10-17 DIAGNOSIS — G43809 Other migraine, not intractable, without status migrainosus: Secondary | ICD-10-CM

## 2013-10-17 DIAGNOSIS — G479 Sleep disorder, unspecified: Secondary | ICD-10-CM

## 2013-10-17 MED ORDER — CYCLOBENZAPRINE HCL 10 MG PO TABS: ORAL_TABLET | ORAL | Status: DC

## 2013-10-17 MED ORDER — RIBOFLAVIN 400 MG PO TABS: ORAL_TABLET | ORAL | Status: DC

## 2013-10-17 MED ORDER — RIZATRIPTAN BENZOATE 10 MG PO TABS: 10.0000 mg | ORAL_TABLET | ORAL | Status: DC | PRN

## 2013-10-17 MED ORDER — TOPIRAMATE 50 MG PO TABS: ORAL_TABLET | ORAL | Status: DC

## 2013-10-17 MED ORDER — VENLAFAXINE HCL ER 150 MG PO CP24: 150.0000 mg | ORAL_CAPSULE | Freq: Every day | ORAL | Status: DC

## 2013-10-17 NOTE — Patient Instructions (Signed)
Your next office appointment will be determined based upon review of your pending x-rays. Those instructions will be transmitted to you through My Chart To prevent sleep dysfunction follow these instructions for sleep hygiene. Do not read, watch TV, or eat in bed. Do not get into bed until you are ready to turn off the light &  to go to sleep. Do not ingest stimulants ( decongestants, diet pills, nicotine, caffeine) after the evening meal.Do not take daytime naps.Cardiovascular exercise, this can be as simple a program as walking, is recommended 30-45 minutes 3-4 times per week. If you're not exercising you should take 6-8 weeks to build up to this level.

## 2013-10-17 NOTE — Progress Notes (Signed)
   Subjective:    Patient ID: [name], female    DOB: 04-12-1965, 48 y.o.   MRN: [id number]  HPI  She's had pain along the lateral aspect of the left foot for approximately a month. She has no specific trigger or injury for this.  The pain varies from 3-6 & is associated with swelling. It is worse when she's standing or walking or when there is direct pressure applied to the lateral aspect of the foot.  She does have occasional numbness and tingling in this area especially when she is wearing sneakers.  Nonsteroidals taken to up to 2-3 times a day have provided only partial response.  She is requesting refills of multiple medications. Apparently Riboflavin Rxed for migraine prophylaxis.Topamax & prn Maxalt also Rxed for migraines.  Amitriptyline @ 10 mg  qhs was Rxed for sleep dysfunction; but it too sedating the next am; but she has taken Cyclobenzaprine 10 mg up to tid prn w/o excess sedation. In addition to the Amitriptyline she is on Venlafaxine 150 mg qd.  No recent CPX on record. Multiple OVs for acute symptom complexes.PMH of depression treated by Dr Lesia Hausen on multiple occasions.    Review of Systems  Fever, chills, sweats, or unexplained weight loss not present.  No loss of control of bladder or bowels. Radicular type pain absent.       Objective:   Physical Exam    Positive or pertinent physical findings include: She has mild crepitus of the knees with associated effusion. There is a visible bulge with soft tissue prominence and tenderness to palpation along the lateral aspect of the left foot She also has some localized lipomatous changes below and anterior to the lateral malleoli. Pedal pulses are equal but slightly decreased.  General appearance :adequately nourished; in no distress.  As per CDC Guidelines ,Epic documents obesity as being present . Eyes: No conjunctival inflammation or scleral icterus is present. Oral exam: Dental hygiene is good. Lips  and gums are healthy appearing.There is no oropharyngeal erythema or exudate noted.  Heart:  Normal rate and regular rhythm. S1 and S2 normal without gallop, murmur, click, rub or other extra sounds   Lungs:Chest clear to auscultation; no wheezes, rhonchi,rales ,or rubs present.No increased work of breathing.  Abdomen: bowel sounds normal, soft and non-tender without masses, organomegaly or hernias noted.  No guarding or rebound.  Skin:Warm & dry.  Intact without suspicious lesions or rashes ; no jaundice or tenting Lymphatic: No lymphadenopathy is noted about the head, neck, axilla            Assessment & Plan:  #1 left lateral foot pain with a visible enlargement; rule out fracture  #2 sleep disorder  #3 request for refills of multiple medications; she is overdue for comprehensive physical exam. Medications will be refilled until she can get in to see Dr. Yetta Barre for that appropriate exam.

## 2013-10-17 NOTE — Progress Notes (Signed)
Pre visit review using our clinic review tool, if applicable. No additional management support is needed unless otherwise documented below in the visit note. 

## 2013-11-13 ENCOUNTER — Other Ambulatory Visit (INDEPENDENT_AMBULATORY_CARE_PROVIDER_SITE_OTHER): Payer: BC Managed Care – PPO

## 2013-11-13 ENCOUNTER — Ambulatory Visit (INDEPENDENT_AMBULATORY_CARE_PROVIDER_SITE_OTHER): Payer: BC Managed Care – PPO | Admitting: Internal Medicine

## 2013-11-13 ENCOUNTER — Encounter: Payer: Self-pay | Admitting: Internal Medicine

## 2013-11-13 VITALS — BP 116/78 | HR 84 | Temp 98.5°F | Resp 16 | Ht 64.0 in | Wt 222.0 lb

## 2013-11-13 DIAGNOSIS — Z0000 Encounter for general adult medical examination without abnormal findings: Secondary | ICD-10-CM

## 2013-11-13 DIAGNOSIS — F3340 Major depressive disorder, recurrent, in remission, unspecified: Secondary | ICD-10-CM

## 2013-11-13 DIAGNOSIS — G43009 Migraine without aura, not intractable, without status migrainosus: Secondary | ICD-10-CM

## 2013-11-13 DIAGNOSIS — Z1231 Encounter for screening mammogram for malignant neoplasm of breast: Secondary | ICD-10-CM | POA: Insufficient documentation

## 2013-11-13 LAB — COMPREHENSIVE METABOLIC PANEL
ALK PHOS: 72 U/L (ref 39–117)
ALT: 14 U/L (ref 0–35)
AST: 17 U/L (ref 0–37)
Albumin: 3.5 g/dL (ref 3.5–5.2)
BILIRUBIN TOTAL: 0.6 mg/dL (ref 0.2–1.2)
BUN: 11 mg/dL (ref 6–23)
CO2: 21 mEq/L (ref 19–32)
CREATININE: 0.8 mg/dL (ref 0.4–1.2)
Calcium: 8.6 mg/dL (ref 8.4–10.5)
Chloride: 111 mEq/L (ref 96–112)
GFR: 78.57 mL/min (ref >60.00)
GLUCOSE: 91 mg/dL (ref 70–99)
Potassium: 3.9 mEq/L (ref 3.5–5.1)
Sodium: 139 mEq/L (ref 135–145)
Total Protein: 6.8 g/dL (ref 6.0–8.3)

## 2013-11-13 LAB — CBC WITH DIFFERENTIAL/PLATELET
BASOS PCT: 0.3 % (ref 0.0–3.0)
Basophils Absolute: 0 10*3/uL (ref 0.0–0.1)
EOS ABS: 0.2 10*3/uL (ref 0.0–0.7)
EOS PCT: 1.4 % (ref 0.0–5.0)
HCT: 42.5 % (ref 36.0–46.0)
HEMOGLOBIN: 13.9 g/dL (ref 12.0–15.0)
Lymphocytes Relative: 20.5 % (ref 12.0–46.0)
Lymphs Abs: 2.3 10*3/uL (ref 0.7–4.0)
MCHC: 32.8 g/dL (ref 30.0–36.0)
MCV: 90.4 fl (ref 78.0–100.0)
MONO ABS: 0.7 10*3/uL (ref 0.1–1.0)
Monocytes Relative: 6.3 % (ref 3.0–12.0)
NEUTROS ABS: 8.2 10*3/uL — AB (ref 1.4–7.7)
Neutrophils Relative %: 71.5 % (ref 43.0–77.0)
Platelets: 253 10*3/uL (ref 150.0–400.0)
RBC: 4.7 Mil/uL (ref 3.87–5.11)
RDW: 13.5 % (ref 11.5–15.5)
WBC: 11.4 10*3/uL — AB (ref 4.0–10.5)

## 2013-11-13 LAB — TSH: TSH: 2.62 u[IU]/mL (ref 0.35–4.50)

## 2013-11-13 LAB — LIPID PANEL
CHOL/HDL RATIO: 5
CHOLESTEROL: 169 mg/dL (ref 0–200)
HDL: 35.4 mg/dL — AB (ref >39.00)
NonHDL: 133.6
TRIGLYCERIDES: 242 mg/dL — AB (ref 0.0–149.0)
VLDL: 48.4 mg/dL — AB (ref 0.0–40.0)

## 2013-11-13 MED ORDER — RIBOFLAVIN 400 MG PO TABS: ORAL_TABLET | ORAL | Status: DC

## 2013-11-13 MED ORDER — TOPIRAMATE 50 MG PO TABS: ORAL_TABLET | ORAL | Status: DC

## 2013-11-13 MED ORDER — VENLAFAXINE HCL ER 150 MG PO CP24: 150.0000 mg | ORAL_CAPSULE | Freq: Every day | ORAL | Status: DC

## 2013-11-13 MED ORDER — RIZATRIPTAN BENZOATE 10 MG PO TABS: 10.0000 mg | ORAL_TABLET | ORAL | Status: DC | PRN

## 2013-11-13 NOTE — Patient Instructions (Signed)
Preventive Care for Adults A healthy lifestyle and preventive care can promote health and wellness. Preventive health guidelines for women include the following key practices.  A routine yearly physical is a good way to check with your health care provider about your health and preventive screening. It is a chance to share any concerns and updates on your health and to receive a thorough exam.  Visit your dentist for a routine exam and preventive care every 6 months. Brush your teeth twice a day and floss once a day. Good oral hygiene prevents tooth decay and gum disease.  The frequency of eye exams is based on your age, health, family medical history, use of contact lenses, and other factors. Follow your health care provider's recommendations for frequency of eye exams.  Eat a healthy diet. Foods like vegetables, fruits, whole grains, low-fat dairy products, and lean protein foods contain the nutrients you need without too many calories. Decrease your intake of foods high in solid fats, added sugars, and salt. Eat the right amount of calories for you.Get information about a proper diet from your health care provider, if necessary.  Regular physical exercise is one of the most important things you can do for your health. Most adults should get at least 150 minutes of moderate-intensity exercise (any activity that increases your heart rate and causes you to sweat) each week. In addition, most adults need muscle-strengthening exercises on 2 or more days a week.  Maintain a healthy weight. The body mass index (BMI) is a screening tool to identify possible weight problems. It provides an estimate of body fat based on height and weight. Your health care provider can find your BMI and can help you achieve or maintain a healthy weight.For adults 20 years and older:  A BMI below 18.5 is considered underweight.  A BMI of 18.5 to 24.9 is normal.  A BMI of 25 to 29.9 is considered overweight.  A BMI of  30 and above is considered obese.  Maintain normal blood lipids and cholesterol levels by exercising and minimizing your intake of saturated fat. Eat a balanced diet with plenty of fruit and vegetables. Blood tests for lipids and cholesterol should begin at age 76 and be repeated every 5 years. If your lipid or cholesterol levels are high, you are over 50, or you are at high risk for heart disease, you may need your cholesterol levels checked more frequently.Ongoing high lipid and cholesterol levels should be treated with medicines if diet and exercise are not working.  If you smoke, find out from your health care provider how to quit. If you do not use tobacco, do not start.  Lung cancer screening is recommended for adults aged 22-80 years who are at high risk for developing lung cancer because of a history of smoking. A yearly low-dose CT scan of the lungs is recommended for people who have at least a 30-pack-year history of smoking and are a current smoker or have quit within the past 15 years. A pack year of smoking is smoking an average of 1 pack of cigarettes a day for 1 year (for example: 1 pack a day for 30 years or 2 packs a day for 15 years). Yearly screening should continue until the smoker has stopped smoking for at least 15 years. Yearly screening should be stopped for people who develop a health problem that would prevent them from having lung cancer treatment.  If you are pregnant, do not drink alcohol. If you are breastfeeding,  be very cautious about drinking alcohol. If you are not pregnant and choose to drink alcohol, do not have more than 1 drink per day. One drink is considered to be 12 ounces (355 mL) of beer, 5 ounces (148 mL) of wine, or 1.5 ounces (44 mL) of liquor.  Avoid use of street drugs. Do not share needles with anyone. Ask for help if you need support or instructions about stopping the use of drugs.  High blood pressure causes heart disease and increases the risk of  stroke. Your blood pressure should be checked at least every 1 to 2 years. Ongoing high blood pressure should be treated with medicines if weight loss and exercise do not work.  If you are 75-52 years old, ask your health care provider if you should take aspirin to prevent strokes.  Diabetes screening involves taking a blood sample to check your fasting blood sugar level. This should be done once every 3 years, after age 15, if you are within normal weight and without risk factors for diabetes. Testing should be considered at a younger age or be carried out more frequently if you are overweight and have at least 1 risk factor for diabetes.  Breast cancer screening is essential preventive care for women. You should practice "breast self-awareness." This means understanding the normal appearance and feel of your breasts and may include breast self-examination. Any changes detected, no matter how small, should be reported to a health care provider. Women in their 58s and 30s should have a clinical breast exam (CBE) by a health care provider as part of a regular health exam every 1 to 3 years. After age 16, women should have a CBE every year. Starting at age 53, women should consider having a mammogram (breast X-ray test) every year. Women who have a family history of breast cancer should talk to their health care provider about genetic screening. Women at a high risk of breast cancer should talk to their health care providers about having an MRI and a mammogram every year.  Breast cancer gene (BRCA)-related cancer risk assessment is recommended for women who have family members with BRCA-related cancers. BRCA-related cancers include breast, ovarian, tubal, and peritoneal cancers. Having family members with these cancers may be associated with an increased risk for harmful changes (mutations) in the breast cancer genes BRCA1 and BRCA2. Results of the assessment will determine the need for genetic counseling and  BRCA1 and BRCA2 testing.  Routine pelvic exams to screen for cancer are no longer recommended for nonpregnant women who are considered low risk for cancer of the pelvic organs (ovaries, uterus, and vagina) and who do not have symptoms. Ask your health care provider if a screening pelvic exam is right for you.  If you have had past treatment for cervical cancer or a condition that could lead to cancer, you need Pap tests and screening for cancer for at least 20 years after your treatment. If Pap tests have been discontinued, your risk factors (such as having a new sexual partner) need to be reassessed to determine if screening should be resumed. Some women have medical problems that increase the chance of getting cervical cancer. In these cases, your health care provider may recommend more frequent screening and Pap tests.  The HPV test is an additional test that may be used for cervical cancer screening. The HPV test looks for the virus that can cause the cell changes on the cervix. The cells collected during the Pap test can be  tested for HPV. The HPV test could be used to screen women aged 30 years and older, and should be used in women of any age who have unclear Pap test results. After the age of 30, women should have HPV testing at the same frequency as a Pap test.  Colorectal cancer can be detected and often prevented. Most routine colorectal cancer screening begins at the age of 50 years and continues through age 75 years. However, your health care provider may recommend screening at an earlier age if you have risk factors for colon cancer. On a yearly basis, your health care provider may provide home test kits to check for hidden blood in the stool. Use of a small camera at the end of a tube, to directly examine the colon (sigmoidoscopy or colonoscopy), can detect the earliest forms of colorectal cancer. Talk to your health care provider about this at age 50, when routine screening begins. Direct  exam of the colon should be repeated every 5-10 years through age 75 years, unless early forms of pre-cancerous polyps or small growths are found.  People who are at an increased risk for hepatitis B should be screened for this virus. You are considered at high risk for hepatitis B if:  You were born in a country where hepatitis B occurs often. Talk with your health care provider about which countries are considered high risk.  Your parents were born in a high-risk country and you have not received a shot to protect against hepatitis B (hepatitis B vaccine).  You have HIV or AIDS.  You use needles to inject street drugs.  You live with, or have sex with, someone who has hepatitis B.  You get hemodialysis treatment.  You take certain medicines for conditions like cancer, organ transplantation, and autoimmune conditions.  Hepatitis C blood testing is recommended for all people born from 1945 through 1965 and any individual with known risks for hepatitis C.  Practice safe sex. Use condoms and avoid high-risk sexual practices to reduce the spread of sexually transmitted infections (STIs). STIs include gonorrhea, chlamydia, syphilis, trichomonas, herpes, HPV, and human immunodeficiency virus (HIV). Herpes, HIV, and HPV are viral illnesses that have no cure. They can result in disability, cancer, and death.  You should be screened for sexually transmitted illnesses (STIs) including gonorrhea and chlamydia if:  You are sexually active and are younger than 24 years.  You are older than 24 years and your health care provider tells you that you are at risk for this type of infection.  Your sexual activity has changed since you were last screened and you are at an increased risk for chlamydia or gonorrhea. Ask your health care provider if you are at risk.  If you are at risk of being infected with HIV, it is recommended that you take a prescription medicine daily to prevent HIV infection. This is  called preexposure prophylaxis (PrEP). You are considered at risk if:  You are a heterosexual woman, are sexually active, and are at increased risk for HIV infection.  You take drugs by injection.  You are sexually active with a partner who has HIV.  Talk with your health care provider about whether you are at high risk of being infected with HIV. If you choose to begin PrEP, you should first be tested for HIV. You should then be tested every 3 months for as long as you are taking PrEP.  Osteoporosis is a disease in which the bones lose minerals and strength   with aging. This can result in serious bone fractures or breaks. The risk of osteoporosis can be identified using a bone density scan. Women ages 65 years and over and women at risk for fractures or osteoporosis should discuss screening with their health care providers. Ask your health care provider whether you should take a calcium supplement or vitamin D to reduce the rate of osteoporosis.  Menopause can be associated with physical symptoms and risks. Hormone replacement therapy is available to decrease symptoms and risks. You should talk to your health care provider about whether hormone replacement therapy is right for you.  Use sunscreen. Apply sunscreen liberally and repeatedly throughout the day. You should seek shade when your shadow is shorter than you. Protect yourself by wearing long sleeves, pants, a wide-brimmed hat, and sunglasses year round, whenever you are outdoors.  Once a month, do a whole body skin exam, using a mirror to look at the skin on your back. Tell your health care provider of new moles, moles that have irregular borders, moles that are larger than a pencil eraser, or moles that have changed in shape or color.  Stay current with required vaccines (immunizations).  Influenza vaccine. All adults should be immunized every year.  Tetanus, diphtheria, and acellular pertussis (Td, Tdap) vaccine. Pregnant women should  receive 1 dose of Tdap vaccine during each pregnancy. The dose should be obtained regardless of the length of time since the last dose. Immunization is preferred during the 27th-36th week of gestation. An adult who has not previously received Tdap or who does not know her vaccine status should receive 1 dose of Tdap. This initial dose should be followed by tetanus and diphtheria toxoids (Td) booster doses every 10 years. Adults with an unknown or incomplete history of completing a 3-dose immunization series with Td-containing vaccines should begin or complete a primary immunization series including a Tdap dose. Adults should receive a Td booster every 10 years.  Varicella vaccine. An adult without evidence of immunity to varicella should receive 2 doses or a second dose if she has previously received 1 dose. Pregnant females who do not have evidence of immunity should receive the first dose after pregnancy. This first dose should be obtained before leaving the health care facility. The second dose should be obtained 4-8 weeks after the first dose.  Human papillomavirus (HPV) vaccine. Females aged 13-26 years who have not received the vaccine previously should obtain the 3-dose series. The vaccine is not recommended for use in pregnant females. However, pregnancy testing is not needed before receiving a dose. If a female is found to be pregnant after receiving a dose, no treatment is needed. In that case, the remaining doses should be delayed until after the pregnancy. Immunization is recommended for any person with an immunocompromised condition through the age of 26 years if she did not get any or all doses earlier. During the 3-dose series, the second dose should be obtained 4-8 weeks after the first dose. The third dose should be obtained 24 weeks after the first dose and 16 weeks after the second dose.  Zoster vaccine. One dose is recommended for adults aged 60 years or older unless certain conditions are  present.  Measles, mumps, and rubella (MMR) vaccine. Adults born before 1957 generally are considered immune to measles and mumps. Adults born in 1957 or later should have 1 or more doses of MMR vaccine unless there is a contraindication to the vaccine or there is laboratory evidence of immunity to   each of the three diseases. A routine second dose of MMR vaccine should be obtained at least 28 days after the first dose for students attending postsecondary schools, health care workers, or international travelers. People who received inactivated measles vaccine or an unknown type of measles vaccine during 1963-1967 should receive 2 doses of MMR vaccine. People who received inactivated mumps vaccine or an unknown type of mumps vaccine before 1979 and are at high risk for mumps infection should consider immunization with 2 doses of MMR vaccine. For females of childbearing age, rubella immunity should be determined. If there is no evidence of immunity, females who are not pregnant should be vaccinated. If there is no evidence of immunity, females who are pregnant should delay immunization until after pregnancy. Unvaccinated health care workers born before 1957 who lack laboratory evidence of measles, mumps, or rubella immunity or laboratory confirmation of disease should consider measles and mumps immunization with 2 doses of MMR vaccine or rubella immunization with 1 dose of MMR vaccine.  Pneumococcal 13-valent conjugate (PCV13) vaccine. When indicated, a person who is uncertain of her immunization history and has no record of immunization should receive the PCV13 vaccine. An adult aged 19 years or older who has certain medical conditions and has not been previously immunized should receive 1 dose of PCV13 vaccine. This PCV13 should be followed with a dose of pneumococcal polysaccharide (PPSV23) vaccine. The PPSV23 vaccine dose should be obtained at least 8 weeks after the dose of PCV13 vaccine. An adult aged 19  years or older who has certain medical conditions and previously received 1 or more doses of PPSV23 vaccine should receive 1 dose of PCV13. The PCV13 vaccine dose should be obtained 1 or more years after the last PPSV23 vaccine dose.  Pneumococcal polysaccharide (PPSV23) vaccine. When PCV13 is also indicated, PCV13 should be obtained first. All adults aged 65 years and older should be immunized. An adult younger than age 65 years who has certain medical conditions should be immunized. Any person who resides in a nursing home or long-term care facility should be immunized. An adult smoker should be immunized. People with an immunocompromised condition and certain other conditions should receive both PCV13 and PPSV23 vaccines. People with human immunodeficiency virus (HIV) infection should be immunized as soon as possible after diagnosis. Immunization during chemotherapy or radiation therapy should be avoided. Routine use of PPSV23 vaccine is not recommended for American Indians, [place] Natives, or people younger than 65 years unless there are medical conditions that require PPSV23 vaccine. When indicated, people who have unknown immunization and have no record of immunization should receive PPSV23 vaccine. One-time revaccination 5 years after the first dose of PPSV23 is recommended for people aged 19-64 years who have chronic kidney failure, nephrotic syndrome, asplenia, or immunocompromised conditions. People who received 1-2 doses of PPSV23 before age 65 years should receive another dose of PPSV23 vaccine at age 65 years or later if at least 5 years have passed since the previous dose. Doses of PPSV23 are not needed for people immunized with PPSV23 at or after age 65 years.  Meningococcal vaccine. Adults with asplenia or persistent complement component deficiencies should receive 2 doses of quadrivalent meningococcal conjugate (MenACWY-D) vaccine. The doses should be obtained at least 2 months apart.  Microbiologists working with certain meningococcal bacteria, military recruits, people at risk during an outbreak, and people who travel to or live in countries with a high rate of meningitis should be immunized. A first-year college student up through age   21 years who is living in a residence hall should receive a dose if she did not receive a dose on or after her 16th birthday. Adults who have certain high-risk conditions should receive one or more doses of vaccine.  Hepatitis A vaccine. Adults who wish to be protected from this disease, have certain high-risk conditions, work with hepatitis A-infected animals, work in hepatitis A research labs, or travel to or work in countries with a high rate of hepatitis A should be immunized. Adults who were previously unvaccinated and who anticipate close contact with an international adoptee during the first 60 days after arrival in the [place] from a country with a high rate of hepatitis A should be immunized.  Hepatitis B vaccine. Adults who wish to be protected from this disease, have certain high-risk conditions, may be exposed to blood or other infectious body fluids, are household contacts or sex partners of hepatitis B positive people, are clients or workers in certain care facilities, or travel to or work in countries with a high rate of hepatitis B should be immunized.  Haemophilus influenzae type b (Hib) vaccine. A previously unvaccinated person with asplenia or sickle cell disease or having a scheduled splenectomy should receive 1 dose of Hib vaccine. Regardless of previous immunization, a recipient of a hematopoietic stem cell transplant should receive a 3-dose series 6-12 months after her successful transplant. Hib vaccine is not recommended for adults with HIV infection. Preventive Services / Frequency Ages 64 to 68 years  Blood pressure check.** / Every 1 to 2 years.  Lipid and cholesterol check.** / Every 5 years beginning at age  22.  Clinical breast exam.** / Every 3 years for women in their 88s and 53s.  BRCA-related cancer risk assessment.** / For women who have family members with a BRCA-related cancer (breast, ovarian, tubal, or peritoneal cancers).  Pap test.** / Every 2 years from ages 90 through 51. Every 3 years starting at age 21 through age 56 or 3 with a history of 3 consecutive normal Pap tests.  HPV screening.** / Every 3 years from ages 24 through ages 1 to 46 with a history of 3 consecutive normal Pap tests.  Hepatitis C blood test.** / For any individual with known risks for hepatitis C.  Skin self-exam. / Monthly.  Influenza vaccine. / Every year.  Tetanus, diphtheria, and acellular pertussis (Tdap, Td) vaccine.** / Consult your health care provider. Pregnant women should receive 1 dose of Tdap vaccine during each pregnancy. 1 dose of Td every 10 years.  Varicella vaccine.** / Consult your health care provider. Pregnant females who do not have evidence of immunity should receive the first dose after pregnancy.  HPV vaccine. / 3 doses over 6 months, if 72 and younger. The vaccine is not recommended for use in pregnant females. However, pregnancy testing is not needed before receiving a dose.  Measles, mumps, rubella (MMR) vaccine.** / You need at least 1 dose of MMR if you were born in 1957 or later. You may also need a 2nd dose. For females of childbearing age, rubella immunity should be determined. If there is no evidence of immunity, females who are not pregnant should be vaccinated. If there is no evidence of immunity, females who are pregnant should delay immunization until after pregnancy.  Pneumococcal 13-valent conjugate (PCV13) vaccine.** / Consult your health care provider.  Pneumococcal polysaccharide (PPSV23) vaccine.** / 1 to 2 doses if you smoke cigarettes or if you have certain conditions.  Meningococcal vaccine.** /  1 dose if you are age 19 to 21 years and a first-year college  student living in a residence hall, or have one of several medical conditions, you need to get vaccinated against meningococcal disease. You may also need additional booster doses.  Hepatitis A vaccine.** / Consult your health care provider.  Hepatitis B vaccine.** / Consult your health care provider.  Haemophilus influenzae type b (Hib) vaccine.** / Consult your health care provider. Ages 40 to 64 years  Blood pressure check.** / Every 1 to 2 years.  Lipid and cholesterol check.** / Every 5 years beginning at age 20 years.  Lung cancer screening. / Every year if you are aged 55-80 years and have a 30-pack-year history of smoking and currently smoke or have quit within the past 15 years. Yearly screening is stopped once you have quit smoking for at least 15 years or develop a health problem that would prevent you from having lung cancer treatment.  Clinical breast exam.** / Every year after age 40 years.  BRCA-related cancer risk assessment.** / For women who have family members with a BRCA-related cancer (breast, ovarian, tubal, or peritoneal cancers).  Mammogram.** / Every year beginning at age 40 years and continuing for as long as you are in good health. Consult with your health care provider.  Pap test.** / Every 3 years starting at age 30 years through age 65 or 70 years with a history of 3 consecutive normal Pap tests.  HPV screening.** / Every 3 years from ages 30 years through ages 65 to 70 years with a history of 3 consecutive normal Pap tests.  Fecal occult blood test (FOBT) of stool. / Every year beginning at age 50 years and continuing until age 75 years. You may not need to do this test if you get a colonoscopy every 10 years.  Flexible sigmoidoscopy or colonoscopy.** / Every 5 years for a flexible sigmoidoscopy or every 10 years for a colonoscopy beginning at age 50 years and continuing until age 75 years.  Hepatitis C blood test.** / For all people born from 1945 through  1965 and any individual with known risks for hepatitis C.  Skin self-exam. / Monthly.  Influenza vaccine. / Every year.  Tetanus, diphtheria, and acellular pertussis (Tdap/Td) vaccine.** / Consult your health care provider. Pregnant women should receive 1 dose of Tdap vaccine during each pregnancy. 1 dose of Td every 10 years.  Varicella vaccine.** / Consult your health care provider. Pregnant females who do not have evidence of immunity should receive the first dose after pregnancy.  Zoster vaccine.** / 1 dose for adults aged 60 years or older.  Measles, mumps, rubella (MMR) vaccine.** / You need at least 1 dose of MMR if you were born in 1957 or later. You may also need a 2nd dose. For females of childbearing age, rubella immunity should be determined. If there is no evidence of immunity, females who are not pregnant should be vaccinated. If there is no evidence of immunity, females who are pregnant should delay immunization until after pregnancy.  Pneumococcal 13-valent conjugate (PCV13) vaccine.** / Consult your health care provider.  Pneumococcal polysaccharide (PPSV23) vaccine.** / 1 to 2 doses if you smoke cigarettes or if you have certain conditions.  Meningococcal vaccine.** / Consult your health care provider.  Hepatitis A vaccine.** / Consult your health care provider.  Hepatitis B vaccine.** / Consult your health care provider.  Haemophilus influenzae type b (Hib) vaccine.** / Consult your health care provider. Ages 65   years and over  Blood pressure check.** / Every 1 to 2 years.  Lipid and cholesterol check.** / Every 5 years beginning at age 22 years.  Lung cancer screening. / Every year if you are aged 73-80 years and have a 30-pack-year history of smoking and currently smoke or have quit within the past 15 years. Yearly screening is stopped once you have quit smoking for at least 15 years or develop a health problem that would prevent you from having lung cancer  treatment.  Clinical breast exam.** / Every year after age 4 years.  BRCA-related cancer risk assessment.** / For women who have family members with a BRCA-related cancer (breast, ovarian, tubal, or peritoneal cancers).  Mammogram.** / Every year beginning at age 40 years and continuing for as long as you are in good health. Consult with your health care provider.  Pap test.** / Every 3 years starting at age 9 years through age 34 or 91 years with 3 consecutive normal Pap tests. Testing can be stopped between 65 and 70 years with 3 consecutive normal Pap tests and no abnormal Pap or HPV tests in the past 10 years.  HPV screening.** / Every 3 years from ages 57 years through ages 64 or 45 years with a history of 3 consecutive normal Pap tests. Testing can be stopped between 65 and 70 years with 3 consecutive normal Pap tests and no abnormal Pap or HPV tests in the past 10 years.  Fecal occult blood test (FOBT) of stool. / Every year beginning at age 15 years and continuing until age 17 years. You may not need to do this test if you get a colonoscopy every 10 years.  Flexible sigmoidoscopy or colonoscopy.** / Every 5 years for a flexible sigmoidoscopy or every 10 years for a colonoscopy beginning at age 86 years and continuing until age 71 years.  Hepatitis C blood test.** / For all people born from [birth year] through 1963 and any individual with known risks for hepatitis C.  Osteoporosis screening.** / A one-time screening for women ages 83 years and over and women at risk for fractures or osteoporosis.  Skin self-exam. / Monthly.  Influenza vaccine. / Every year.  Tetanus, diphtheria, and acellular pertussis (Tdap/Td) vaccine.** / 1 dose of Td every 10 years.  Varicella vaccine.** / Consult your health care provider.  Zoster vaccine.** / 1 dose for adults aged 61 years or older.  Pneumococcal 13-valent conjugate (PCV13) vaccine.** / Consult your health care provider.  Pneumococcal  polysaccharide (PPSV23) vaccine.** / 1 dose for all adults aged 28 years and older.  Meningococcal vaccine.** / Consult your health care provider.  Hepatitis A vaccine.** / Consult your health care provider.  Hepatitis B vaccine.** / Consult your health care provider.  Haemophilus influenzae type b (Hib) vaccine.** / Consult your health care provider. ** Family history and personal history of risk and conditions may change your health care provider's recommendations. Document Released: 03/22/2001 Document Revised: 06/10/2013 Document Reviewed: 06/21/2010 [facility] Patient Information 2015 [place], [place]. This information is not intended to replace advice given to you by your health care provider. Make sure you discuss any questions you have with your health care provider.

## 2013-11-13 NOTE — Progress Notes (Signed)
   Subjective:    Patient ID: [name], female    DOB: 12/25/1965, 48 y.o.   MRN: [id number]  HPI Comments: She returns for a physical and tells me that she feels well and offers no complaints.     Review of Systems  Constitutional: Negative.  Negative for fever, chills, diaphoresis, appetite change and fatigue.  HENT: Negative.   Eyes: Negative.   Respiratory: Negative.  Negative for cough, choking, chest tightness, shortness of breath and stridor.   Cardiovascular: Negative.  Negative for chest pain, palpitations and leg swelling.  Gastrointestinal: Negative.  Negative for nausea, vomiting, abdominal pain, diarrhea, constipation and blood in stool.  Endocrine: Negative.   Genitourinary: Negative.   Musculoskeletal: Negative.   Skin: Negative.   Allergic/Immunologic: Negative.   Neurological: Negative.  Negative for dizziness, tremors, seizures, syncope, facial asymmetry, speech difficulty, weakness, light-headedness, numbness and headaches.  Hematological: Negative.  Negative for adenopathy. Does not bruise/bleed easily.  Psychiatric/Behavioral: Negative.        Objective:   Physical Exam  Vitals reviewed. Constitutional: She is oriented to person, place, and time. She appears well-developed and well-nourished. No distress.  HENT:  Head: Normocephalic and atraumatic.  Mouth/Throat: Oropharynx is clear and moist. No oropharyngeal exudate.  Eyes: Conjunctivae are normal. Right eye exhibits no discharge. Left eye exhibits no discharge. No scleral icterus.  Neck: Normal range of motion. Neck supple. No JVD present. No tracheal deviation present. No thyromegaly present.  Cardiovascular: Normal rate, regular rhythm, normal heart sounds and intact distal pulses.  Exam reveals no gallop and no friction rub.   No murmur heard. Pulmonary/Chest: Effort normal and breath sounds normal. No stridor. No respiratory distress. She has no wheezes. She has no rales. She exhibits no  tenderness.  Abdominal: Soft. Bowel sounds are normal. She exhibits no distension and no mass. There is no tenderness. There is no rebound and no guarding.  Musculoskeletal: Normal range of motion. She exhibits no edema and no tenderness.  Lymphadenopathy:    She has no cervical adenopathy.  Neurological: She is oriented to person, place, and time.  Skin: Skin is warm and dry. No rash noted. She is not diaphoretic. No erythema. No pallor.  Psychiatric: She has a normal mood and affect. Her behavior is normal. Judgment and thought content normal.     Lab Results  Component Value Date   WBC 9.1 01/13/2012   HGB 13.9 01/13/2012   HCT 41.7 01/13/2012   PLT 220.0 01/13/2012   GLUCOSE 87 01/13/2012   CHOL 152 01/13/2012   TRIG 106.0 01/13/2012   HDL 41.70 01/13/2012   LDLCALC 89 01/13/2012   ALT 17 01/13/2012   AST 15 01/13/2012   NA 140 01/13/2012   K 3.9 01/13/2012   CL 110 01/13/2012   CREATININE 0.8 01/13/2012   BUN 11 01/13/2012   CO2 24 01/13/2012   TSH 2.27 01/13/2012   HGBA1C 5.3 01/13/2012       Assessment & Plan:

## 2013-11-13 NOTE — Progress Notes (Signed)
Pre visit review using our clinic review tool, if applicable. No additional management support is needed unless otherwise documented below in the visit note. 

## 2013-11-14 ENCOUNTER — Encounter: Payer: Self-pay | Admitting: Internal Medicine

## 2013-11-14 ENCOUNTER — Telehealth: Payer: Self-pay

## 2013-11-14 DIAGNOSIS — G43009 Migraine without aura, not intractable, without status migrainosus: Secondary | ICD-10-CM

## 2013-11-14 LAB — LDL CHOLESTEROL, DIRECT: Direct LDL: 108.2 mg/dL

## 2013-11-14 MED ORDER — RIBOFLAVIN 400 MG PO TABS: ORAL_TABLET | ORAL | Status: DC

## 2013-11-14 NOTE — Assessment & Plan Note (Signed)
Exam done Vaccines were reviewed and updated Labs ordered She was referred for a mammo Pt ed material was given 

## 2013-11-14 NOTE — Telephone Encounter (Signed)
Received fax from pharmacy inquiring about the dosage of riboflavin 400 mg tabs, take 4 tabs po daily.  They wanted to know if you really wanted the pt to take 1600 mg of this rx, or 4 tabs of 100 mg po daily. Please advise

## 2013-11-14 NOTE — Telephone Encounter (Signed)
corrected

## 2013-11-20 LAB — HM PAP SMEAR

## 2013-12-13 ENCOUNTER — Ambulatory Visit (INDEPENDENT_AMBULATORY_CARE_PROVIDER_SITE_OTHER): Payer: BC Managed Care – PPO | Admitting: Family Medicine

## 2013-12-13 ENCOUNTER — Encounter: Payer: Self-pay | Admitting: Family Medicine

## 2013-12-13 VITALS — BP 130/80 | HR 68 | Temp 98.0°F | Wt 222.0 lb

## 2013-12-13 DIAGNOSIS — J069 Acute upper respiratory infection, unspecified: Secondary | ICD-10-CM

## 2013-12-13 MED ORDER — GUAIFENESIN-CODEINE 100-10 MG/5ML PO SOLN: 5.0000 mL | Freq: Four times a day (QID) | ORAL | Status: DC | PRN

## 2013-12-13 NOTE — Patient Instructions (Signed)
Upper Respiratory Infection The most important thing is to get a lot of rest and stay well hydrated.  If you develop fevers, worsening of symptoms beyond the time we discussed (10 days), worsening of symptoms after they get better, please schedule a follow up visit for recheck as bacterial infections can develop.    For fever or pain-use tylenol or ibuprofen  For cough- codeine cough syrup OR Mucinex. Only do codeine if you are not going to be driving for 8 hours.  For congestion-try a neti pot and make sure to follow instructions for water. Mucinex/cough syrup also help some with this-helping you cough stuff up.

## 2013-12-13 NOTE — Progress Notes (Signed)
  Melanie ConchStephen Aviendha Azbell, MD Phone: [phone]  Subjective:   [name] is a 48 y.o. year old very pleasant female patient who presents with the following:  Cough/congestion Lots of drainage down throat. Coughing up yellow stuff. Minimal nasal congestion except in the morning. Symptoms stable since Tuesday-havent improved or worsened. No other treatments tried. Not able to sleep due to drainage and cough. Itching ears.   Normally has allergies this time of year. Generic claritin started about a month ago.  3 people at work with similar illness-one walking pna, one ear infection, one z pack.  Symptoms started Sunday. Started taking robitussen and little help. Robitussen Dm also with little help.   ROS-no fever or chills. No sinus pressure.  Past Medical History- depression on effexor  Medications- reviewed and updated Current Outpatient Prescriptions  Medication Sig Dispense Refill  . Riboflavin 400 MG TABS take 1 tablet by mouth once daily 30 tablet 11  . rizatriptan (MAXALT) 10 MG tablet Take 1 tablet (10 mg total) by mouth as needed. For migraines. May repeat in 2 hours if needed 9 tablet 11  . topiramate (TOPAMAX) 50 MG tablet take 1 tablet by mouth twice a day 180 tablet 3  . venlafaxine XR (EFFEXOR-XR) 150 MG 24 hr capsule Take 1 capsule (150 mg total) by mouth daily. 90 capsule 3  . cyclobenzaprine (FLEXERIL) 10 MG tablet 1/2-1 qhs prn 10 tablet 0   No current facility-administered medications for this visit.    Objective: BP 130/80 mmHg  Pulse 68  Temp(Src) 98 F (36.7 C)  Wt 222 lb (100.699 kg)  SpO2 98% Gen: NAD, resting comfortably HEENT: turbinates erythematous and swollen with some yellow nasal mucus, oropharynx normal without pharyngeal exudate, TM normal bilaterally, Mucous membranes are moist. Eyes: sclera and lids normal CV: RRR no murmurs rubs or gallops Lungs: CTAB no crackles, wheeze, rhonchi Soft/nontender abdomen, obese Skin: warm, dry, no rash Neuro:  grossly normal, moves all extremities  Assessment/Plan:  Upper Respiratory Infection Advised of symptomatic care (see AVS). Codeine cough syrup.  Doubt influenza as afebrile. Still needs flu shot when over illness.  Given reasons for return   Meds ordered this encounter  Medications  . guaiFENesin-codeine 100-10 MG/5ML syrup    Sig: Take 5 mLs by mouth every 6 (six) hours as needed for cough.    Dispense:  120 mL    Refill:  0

## 2013-12-16 ENCOUNTER — Ambulatory Visit (INDEPENDENT_AMBULATORY_CARE_PROVIDER_SITE_OTHER): Payer: BC Managed Care – PPO | Admitting: Family Medicine

## 2013-12-16 ENCOUNTER — Encounter: Payer: Self-pay | Admitting: Family Medicine

## 2013-12-16 VITALS — BP 120/80 | Temp 98.4°F | Wt 222.0 lb

## 2013-12-16 DIAGNOSIS — B9789 Other viral agents as the cause of diseases classified elsewhere: Secondary | ICD-10-CM

## 2013-12-16 DIAGNOSIS — J329 Chronic sinusitis, unspecified: Secondary | ICD-10-CM

## 2013-12-16 DIAGNOSIS — B349 Viral infection, unspecified: Secondary | ICD-10-CM

## 2013-12-16 MED ORDER — HYDROCODONE-HOMATROPINE 5-1.5 MG/5ML PO SYRP: 2.5000 mL | ORAL_SOLUTION | Freq: Three times a day (TID) | ORAL | Status: DC | PRN

## 2013-12-16 MED ORDER — AZITHROMYCIN 250 MG PO TABS: ORAL_TABLET | ORAL | Status: DC

## 2013-12-16 NOTE — Patient Instructions (Signed)
Suspect this is now a viral sinusitis with your new sinus tenderness. If it lasts past 10 days, that is enough to call it bacterial sinusitis. Start antibiotic on Wednesday if no improvement tomorrow.   I would go ahead and schedule an appointment next Monday with Dr. Yetta BarreJones in case symptoms recur or do not improve.

## 2013-12-16 NOTE — Progress Notes (Signed)
  Tana ConchStephen Leone Mobley, MD Phone: [phone]  Subjective:   Melanie DredgeJennifer A Voorhis is a 48 y.o. year old very pleasant female patient who presents with the following:  Cough/congestion/sinus pressure Saw patient on Friday for presumed URI. Since that time, symptoms have persisted. She has developed low grade temp elevations to 99.2 on Saturday and Sunday as well as sinus pressure on right side. Continues to cough up yellow stuff. With codeine cough syrup still sleeping only 4 hours due to cough. Tried neti pot but was diffuclt to use. Still having throat drainage. Still taking daily claritin. Sick contacts as noted last visit. Robitussen DM did not help previously. This is day 9 of illness today.   ROS-no fever (above 100.5) or chills. No nausea/vomiting  Past Medical History- depression on effexor  Medications- reviewed and updated Current Outpatient Prescriptions  Medication Sig Dispense Refill  . guaiFENesin-codeine 100-10 MG/5ML syrup Take 5 mLs by mouth every 6 (six) hours as needed for cough. 120 mL 0  . Riboflavin 400 MG TABS take 1 tablet by mouth once daily 30 tablet 11  . rizatriptan (MAXALT) 10 MG tablet Take 1 tablet (10 mg total) by mouth as needed. For migraines. May repeat in 2 hours if needed 9 tablet 11  . topiramate (TOPAMAX) 50 MG tablet take 1 tablet by mouth twice a day 180 tablet 3  . venlafaxine XR (EFFEXOR-XR) 150 MG 24 hr capsule Take 1 capsule (150 mg total) by mouth daily. 90 capsule 3  . cyclobenzaprine (FLEXERIL) 10 MG tablet 1/2-1 qhs prn 10 tablet 0   No current facility-administered medications for this visit.    Objective: BP 120/80 mmHg  Temp(Src) 98.4 F (36.9 C)  Wt 222 lb (100.699 kg) Gen: NAD, resting comfortably HEENT: turbinates erythematous and swollen with some yellow nasal mucus, oropharynx normal without pharyngeal exudate, TM normal bilaterally, Mucous membranes are moist. ALl similar in appearance.  Head: tender right maxillary and frontal sinus  (new) Eyes: sclera and lids normal CV: RRR no murmurs rubs or gallops Lungs: CTAB no crackles, wheeze, rhonchi Soft/nontender abdomen, obese Skin: warm, dry, no rash Neuro: grossly normal, moves all extremities  Assessment/Plan:  Viral Sinusitis Symptoms now at day 9. If persists past 10 days will call this bacterial sinusitis. Called in azithromycin to pharmacy to be filled 11/11 if symptoms persist. Asked patient to see her PCP if this does not resolve symptoms (tells me years ago had some sinus issues that had a more prolonged course requiring ENT visit and 1 month antibiotics) Increase to hycodan to help with sleep-still no driving on this medicine.  Once again, Doubt influenza as afebrile. Still needs flu shot when over illness.  Given reasons for return   Meds ordered this encounter  Medications  . HYDROcodone-homatropine (HYCODAN) 5-1.5 MG/5ML syrup    Sig: Take 2.5-5 mLs by mouth every 8 (eight) hours as needed for cough.    Dispense:  120 mL    Refill:  0  . azithromycin (ZITHROMAX) 250 MG tablet    Sig: Take 2 pills day 1, then 1 pill daily until done. May fill 12/18/13.    Dispense:  6 tablet    Refill:  0

## 2013-12-20 ENCOUNTER — Telehealth: Payer: Self-pay | Admitting: Internal Medicine

## 2013-12-20 MED ORDER — PROMETHAZINE-DM 6.25-15 MG/5ML PO SYRP: 5.0000 mL | ORAL_SOLUTION | Freq: Four times a day (QID) | ORAL | Status: DC | PRN

## 2013-12-20 NOTE — Telephone Encounter (Signed)
Called pt no answer LMOM md sent new cough syrup to [facility], also ok to take benadryl for the itching....Raechel Chute/lmb

## 2013-12-20 NOTE — Telephone Encounter (Signed)
Patient called stating she has started itching and developed a rash that she believes to be caused by the cough medicine she was prescribed by Dr. Durene CalHunter. She is requesting a different cough medication that she can also take during the day. Pt uses [facility] on [address]. CB# [phone]

## 2013-12-20 NOTE — Telephone Encounter (Signed)
done

## 2013-12-20 NOTE — Telephone Encounter (Signed)
Also she is wanting to know what she can take for the itching...Raechel Chute/lmb

## 2013-12-24 ENCOUNTER — Ambulatory Visit (INDEPENDENT_AMBULATORY_CARE_PROVIDER_SITE_OTHER)
Admission: RE | Admit: 2013-12-24 | Discharge: 2013-12-24 | Disposition: A | Discharge status: Home / Self Care | Payer: BC Managed Care – PPO | Source: Ambulatory Visit | Attending: Internal Medicine | Admitting: Internal Medicine

## 2013-12-24 ENCOUNTER — Encounter: Payer: Self-pay | Admitting: Internal Medicine

## 2013-12-24 ENCOUNTER — Ambulatory Visit (INDEPENDENT_AMBULATORY_CARE_PROVIDER_SITE_OTHER): Payer: BC Managed Care – PPO | Admitting: Internal Medicine

## 2013-12-24 VITALS — BP 112/84 | HR 69 | Temp 98.0°F | Resp 16 | Ht 64.0 in | Wt 219.0 lb

## 2013-12-24 DIAGNOSIS — J0100 Acute maxillary sinusitis, unspecified: Secondary | ICD-10-CM | POA: Insufficient documentation

## 2013-12-24 DIAGNOSIS — J322 Chronic ethmoidal sinusitis: Secondary | ICD-10-CM | POA: Insufficient documentation

## 2013-12-24 DIAGNOSIS — J0120 Acute ethmoidal sinusitis, unspecified: Secondary | ICD-10-CM

## 2013-12-24 DIAGNOSIS — R05 Cough: Secondary | ICD-10-CM

## 2013-12-24 DIAGNOSIS — J301 Allergic rhinitis due to pollen: Secondary | ICD-10-CM

## 2013-12-24 DIAGNOSIS — R059 Cough, unspecified: Secondary | ICD-10-CM | POA: Insufficient documentation

## 2013-12-24 DIAGNOSIS — J0101 Acute recurrent maxillary sinusitis: Secondary | ICD-10-CM

## 2013-12-24 MED ORDER — AMOXICILLIN-POT CLAVULANATE 875-125 MG PO TABS: 1.0000 | ORAL_TABLET | Freq: Two times a day (BID) | ORAL | Status: AC

## 2013-12-24 MED ORDER — METHYLPREDNISOLONE ACETATE 80 MG/ML IJ SUSP
80.0000 mg | Freq: Once | INTRAMUSCULAR | Status: AC
  Administered 2013-12-24: 80 mg via INTRAMUSCULAR

## 2013-12-24 MED ORDER — METHYLPREDNISOLONE ACETATE 40 MG/ML IJ SUSP
40.0000 mg | Freq: Once | INTRAMUSCULAR | Status: AC
  Administered 2013-12-24: 40 mg via INTRA_ARTICULAR

## 2013-12-24 NOTE — Patient Instructions (Signed)

## 2013-12-24 NOTE — Progress Notes (Signed)
Subjective:    Patient ID: [name], female    DOB: 07/06/1965, 48 y.o.   MRN: [id number]  Sinusitis This is a recurrent problem. The current episode started 1 to 4 weeks ago. The problem is unchanged. There has been no fever. The fever has been present for less than 1 day. Her pain is at a severity of 0/10. She is experiencing no pain. Associated symptoms include sinus pressure. Pertinent negatives include no chills, congestion, coughing, diaphoresis, hoarse voice, neck pain, shortness of breath, sneezing, sore throat or swollen glands. Past treatments include antibiotics. The treatment provided mild relief.      Review of Systems  Constitutional: Negative.  Negative for fever, chills, diaphoresis, appetite change and fatigue.  HENT: Positive for postnasal drip, rhinorrhea, sinus pressure and voice change (laryngitis). Negative for congestion, dental problem, hoarse voice, nosebleeds, sneezing, sore throat, tinnitus and trouble swallowing.   Eyes: Negative.   Respiratory: Negative.  Negative for cough, choking, chest tightness, shortness of breath and stridor.   Cardiovascular: Negative.  Negative for chest pain, palpitations and leg swelling.  Gastrointestinal: Negative.  Negative for nausea, vomiting, abdominal pain, diarrhea, constipation and blood in stool.  Endocrine: Negative.   Genitourinary: Negative.   Musculoskeletal: Negative.  Negative for neck pain.  Skin: Negative.  Negative for rash.  Allergic/Immunologic: Negative.   Neurological: Negative.   Hematological: Negative.  Negative for adenopathy. Does not bruise/bleed easily.  Psychiatric/Behavioral: Negative.        Objective:   Physical Exam  Constitutional: She is oriented to person, place, and time. She appears well-developed and well-nourished. No distress.  HENT:  Head: Normocephalic and atraumatic.  Right Ear: Hearing, tympanic membrane, external ear and ear canal normal.  Left Ear: Hearing, tympanic  membrane, external ear and ear canal normal.  Nose: Mucosal edema and rhinorrhea present. No nasal deformity. No epistaxis. Right sinus exhibits maxillary sinus tenderness. Right sinus exhibits no frontal sinus tenderness. Left sinus exhibits maxillary sinus tenderness. Left sinus exhibits no frontal sinus tenderness.  Mouth/Throat: Oropharynx is clear and moist and mucous membranes are normal. Mucous membranes are not pale, not dry and not cyanotic. No trismus in the jaw. No uvula swelling. No oropharyngeal exudate, posterior oropharyngeal edema, posterior oropharyngeal erythema or tonsillar abscesses.  Eyes: Conjunctivae are normal. Right eye exhibits no discharge. Left eye exhibits no discharge. No scleral icterus.  Neck: Normal range of motion. Neck supple. No JVD present. No tracheal deviation present. No thyromegaly present.  Cardiovascular: Normal rate, regular rhythm, normal heart sounds and intact distal pulses.  Exam reveals no gallop and no friction rub.   No murmur heard. Pulmonary/Chest: Effort normal and breath sounds normal. No stridor. No respiratory distress. She has no wheezes. She has no rales. She exhibits no tenderness.  Abdominal: Soft. Bowel sounds are normal. She exhibits no distension and no mass. There is no tenderness. There is no rebound and no guarding.  Musculoskeletal: Normal range of motion. She exhibits no edema or tenderness.  Lymphadenopathy:    She has no cervical adenopathy.  Neurological: She is oriented to person, place, and time.  Skin: Skin is warm and dry. No rash noted. She is not diaphoretic. No erythema. No pallor.  Psychiatric: She has a normal mood and affect. Her behavior is normal. Judgment and thought content normal.  Vitals reviewed.    Lab Results  Component Value Date   WBC 11.4* 11/13/2013   HGB 13.9 11/13/2013   HCT 42.5 11/13/2013   PLT 253.0  11/13/2013   GLUCOSE 91 11/13/2013   CHOL 169 11/13/2013   TRIG 242.0* 11/13/2013   HDL  35.40* 11/13/2013   LDLDIRECT 108.2 11/13/2013   LDLCALC 89 01/13/2012   ALT 14 11/13/2013   AST 17 11/13/2013   NA 139 11/13/2013   K 3.9 11/13/2013   CL 111 11/13/2013   CREATININE 0.8 11/13/2013   BUN 11 11/13/2013   CO2 21 11/13/2013   TSH 2.62 11/13/2013   HGBA1C 5.3 01/13/2012       Assessment & Plan:

## 2013-12-24 NOTE — Progress Notes (Signed)
Pre visit review using our clinic review tool, if applicable. No additional management support is needed unless otherwise documented below in the visit note. 

## 2013-12-25 ENCOUNTER — Encounter: Payer: Self-pay | Admitting: Internal Medicine

## 2013-12-25 NOTE — Assessment & Plan Note (Signed)
Her CXR is normal.

## 2013-12-25 NOTE — Assessment & Plan Note (Signed)
She tried zithromax without much relief from her symptoms so will try augmentin this time

## 2013-12-25 NOTE — Assessment & Plan Note (Signed)
She is having a flare up of her s/s so I gave her an injection of depo-medrol IM

## 2014-01-27 ENCOUNTER — Other Ambulatory Visit (INDEPENDENT_AMBULATORY_CARE_PROVIDER_SITE_OTHER): Payer: BC Managed Care – PPO

## 2014-01-27 ENCOUNTER — Encounter: Payer: Self-pay | Admitting: Internal Medicine

## 2014-01-27 ENCOUNTER — Ambulatory Visit (INDEPENDENT_AMBULATORY_CARE_PROVIDER_SITE_OTHER): Payer: BC Managed Care – PPO | Admitting: Internal Medicine

## 2014-01-27 ENCOUNTER — Other Ambulatory Visit: Payer: BC Managed Care – PPO

## 2014-01-27 VITALS — BP 134/90 | HR 72 | Temp 98.0°F | Resp 16 | Ht 64.0 in | Wt 218.0 lb

## 2014-01-27 DIAGNOSIS — R21 Rash and other nonspecific skin eruption: Secondary | ICD-10-CM

## 2014-01-27 DIAGNOSIS — R590 Localized enlarged lymph nodes: Secondary | ICD-10-CM

## 2014-01-27 LAB — CBC WITH DIFFERENTIAL/PLATELET
Basophils Absolute: 0 10*3/uL (ref 0.0–0.1)
Basophils Relative: 0.3 % (ref 0.0–3.0)
EOS ABS: 0.2 10*3/uL (ref 0.0–0.7)
Eosinophils Relative: 1.5 % (ref 0.0–5.0)
HCT: 42.8 % (ref 36.0–46.0)
Hemoglobin: 14.3 g/dL (ref 12.0–15.0)
LYMPHS PCT: 17.5 % (ref 12.0–46.0)
Lymphs Abs: 2 10*3/uL (ref 0.7–4.0)
MCHC: 33.4 g/dL (ref 30.0–36.0)
MCV: 88.9 fl (ref 78.0–100.0)
MONO ABS: 0.7 10*3/uL (ref 0.1–1.0)
Monocytes Relative: 6 % (ref 3.0–12.0)
NEUTROS ABS: 8.4 10*3/uL — AB (ref 1.4–7.7)
Neutrophils Relative %: 74.7 % (ref 43.0–77.0)
Platelets: 254 10*3/uL (ref 150.0–400.0)
RBC: 4.81 Mil/uL (ref 3.87–5.11)
RDW: 13.4 % (ref 11.5–15.5)
WBC: 11.3 10*3/uL — ABNORMAL HIGH (ref 4.0–10.5)

## 2014-01-27 LAB — SEDIMENTATION RATE: Sed Rate: 9 mm/hr (ref 0–22)

## 2014-01-27 LAB — COMPREHENSIVE METABOLIC PANEL
ALK PHOS: 77 U/L (ref 39–117)
ALT: 15 U/L (ref 0–35)
AST: 16 U/L (ref 0–37)
Albumin: 4.1 g/dL (ref 3.5–5.2)
BUN: 12 mg/dL (ref 6–23)
CALCIUM: 8.5 mg/dL (ref 8.4–10.5)
CO2: 24 mEq/L (ref 19–32)
CREATININE: 0.8 mg/dL (ref 0.4–1.2)
Chloride: 109 mEq/L (ref 96–112)
GFR: 85.6 mL/min (ref >60.00)
Glucose, Bld: 93 mg/dL (ref 70–99)
Potassium: 3.6 mEq/L (ref 3.5–5.1)
Sodium: 140 mEq/L (ref 135–145)
Total Bilirubin: 0.6 mg/dL (ref 0.2–1.2)
Total Protein: 6.6 g/dL (ref 6.0–8.3)

## 2014-01-27 MED ORDER — AMOXICILLIN 875 MG PO TABS: 875.0000 mg | ORAL_TABLET | Freq: Two times a day (BID) | ORAL | Status: DC

## 2014-01-27 NOTE — Patient Instructions (Signed)
Viral Exanthems, Adult °Many viral infections of the skin are called viral exanthems. Exanthem is another name for a rash or skin eruption. The most common viral exanthems include the following: °· German measles or rubella. °· Measles or rubeola. °· Roseola. °· Parvovirus B19 (Erythema infectiosum or Fifth disease). °· Chickenpox or varicella. °DIAGNOSIS  °Sometimes, other problems may cause a rash that looks like a viral exanthem. Most often, your caregiver can determine whether you have a viral exanthem by looking at the rash. They usually have distinct patterns or appearance. Lab work may be done if the diagnosis is uncertain. Sometimes, a small tissue sample (biopsy) of the rash may need to be taken. °TREATMENT  °Immunizations have led to a decrease in the number of cases of measles, mumps, and rubella. Viral exanthems may require clinical treatment if a bacterial infection or other problems follow. The rash may be associated with: °· Minor sore throat. °· Aches and pains. °· Runny nose. °· Watery eyes. °· Tiredness. °· Some coughs. °· Gastrointestinal infections causing nausea, vomiting, and diarrhea. °Viral exanthems do not respond to antibiotic medicines, because they are not caused by bacteria. °HOME CARE INSTRUCTIONS  °· Only take over-the-counter or prescription medicines for pain, discomfort, diarrhea, or fever as directed by your caregiver. °· Drink enough water and fluids to keep your urine clear or pale yellow. °SEEK MEDICAL CARE IF: °· You develop swollen neck glands. This may feel like lumps or bumps in the neck. °· You develop tenderness over your sinuses. °· You are not feeling partly better after 3 days. °· You develop muscle aches. °· You are feeling more tired than you would expect. °· You get a persistent cough with mucus. °SEEK IMMEDIATE MEDICAL CARE IF:  °· You have a fever. °· You develop red eyes or eye pain. °· You develop sores in your mouth and difficulty drinking or eating. °· You  develop a sore throat with pus and difficulty swallowing. °· You develop neck pain or a stiff neck. °· You develop a severe headache. °· You develop vomiting that will not stop. °Document Released: 04/16/2002 Document Revised: 04/18/2011 Document Reviewed: 04/13/2010 °[employer]® Patient Information ©2015 [employer]. This information is not intended to replace advice given to you by your health care provider. Make sure you discuss any questions you have with your health care provider. ° °

## 2014-01-27 NOTE — Progress Notes (Signed)
Subjective:    Patient ID: [name], female    DOB: 09/08/1965, 48 y.o.   MRN: [id number]  Rash This is a new problem. The current episode started in the past 7 days. The problem is unchanged. The affected locations include the right upper leg and left upper leg. The rash is characterized by redness, itchiness and burning. She was exposed to nothing. Associated symptoms include a sore throat. Pertinent negatives include no anorexia, cough, diarrhea, eye pain, facial edema, fatigue, fever, joint pain, nail changes, rhinorrhea, shortness of breath or vomiting. Past treatments include anti-itch cream, antihistamine and topical steroids. The treatment provided no relief. Her past medical history is significant for eczema. There is no history of allergies, asthma or varicella.      Review of Systems  Constitutional: Negative.  Negative for fever, chills, diaphoresis, appetite change and fatigue.  HENT: Positive for sore throat. Negative for rhinorrhea and trouble swallowing.   Eyes: Negative.  Negative for pain.  Respiratory: Negative.  Negative for cough, choking, chest tightness, shortness of breath and stridor.   Cardiovascular: Negative.  Negative for chest pain, palpitations and leg swelling.  Gastrointestinal: Negative.  Negative for nausea, vomiting, abdominal pain, diarrhea, constipation, blood in stool and anorexia.  Endocrine: Negative.   Genitourinary: Negative.   Musculoskeletal: Negative.  Negative for myalgias, back pain, joint pain and joint swelling.  Skin: Positive for rash. Negative for nail changes.  Allergic/Immunologic: Negative.   Neurological: Negative.   Hematological: Positive for adenopathy (right side of her neck). Does not bruise/bleed easily.  Psychiatric/Behavioral: Negative.        Objective:   Physical Exam  Constitutional: She is oriented to person, place, and time. She appears well-developed and well-nourished.  Non-toxic appearance. She does not  have a sickly appearance. She does not appear ill. No distress.  HENT:  Head: Normocephalic and atraumatic.  Mouth/Throat: Mucous membranes are normal. Mucous membranes are not pale, not dry and not cyanotic. No oral lesions. No trismus in the jaw. No uvula swelling. Posterior oropharyngeal erythema present. No oropharyngeal exudate, posterior oropharyngeal edema or tonsillar abscesses.  Eyes: Conjunctivae are normal. Right eye exhibits no discharge. Left eye exhibits no discharge. No scleral icterus.  Neck: Normal range of motion. Neck supple. No JVD present. No tracheal deviation present. No thyromegaly present.  Cardiovascular: Normal rate, regular rhythm, normal heart sounds and intact distal pulses.  Exam reveals no gallop and no friction rub.   No murmur heard. Pulmonary/Chest: Effort normal and breath sounds normal. No stridor. No respiratory distress. She has no wheezes. She has no rales. She exhibits no tenderness.  Abdominal: Soft. Bowel sounds are normal. She exhibits no distension and no mass. There is no tenderness. There is no rebound and no guarding.  Musculoskeletal: Normal range of motion. She exhibits no edema or tenderness.  Lymphadenopathy:       Head (right side): No submental, no submandibular, no tonsillar, no preauricular, no posterior auricular and no occipital adenopathy present.       Head (left side): No submental, no submandibular, no tonsillar, no preauricular, no posterior auricular and no occipital adenopathy present.    She has cervical adenopathy.       Right cervical: Superficial cervical adenopathy present. No deep cervical and no posterior cervical adenopathy present.      Left cervical: No superficial cervical, no deep cervical and no posterior cervical adenopathy present.    She has no axillary adenopathy.       Right:  No supraclavicular adenopathy present.       Left: No supraclavicular adenopathy present.  Neurological: She is oriented to person, place,  and time.  Skin: Skin is warm, dry and intact. Rash noted. No bruising, no petechiae and no purpura noted. Rash is urticarial. Rash is not macular, not papular, not maculopapular, not nodular, not pustular and not vesicular. She is not diaphoretic. No erythema. No pallor.     Psychiatric: She has a normal mood and affect. Her behavior is normal. Judgment and thought content normal.  Vitals reviewed.    Lab Results  Component Value Date   WBC 11.4* 11/13/2013   HGB 13.9 11/13/2013   HCT 42.5 11/13/2013   PLT 253.0 11/13/2013   GLUCOSE 91 11/13/2013   CHOL 169 11/13/2013   TRIG 242.0* 11/13/2013   HDL 35.40* 11/13/2013   LDLDIRECT 108.2 11/13/2013   LDLCALC 89 01/13/2012   ALT 14 11/13/2013   AST 17 11/13/2013   NA 139 11/13/2013   K 3.9 11/13/2013   CL 111 11/13/2013   CREATININE 0.8 11/13/2013   BUN 11 11/13/2013   CO2 21 11/13/2013   TSH 2.62 11/13/2013   HGBA1C 5.3 01/13/2012       Assessment & Plan:

## 2014-01-28 DIAGNOSIS — R21 Rash and other nonspecific skin eruption: Secondary | ICD-10-CM | POA: Insufficient documentation

## 2014-01-28 NOTE — Assessment & Plan Note (Signed)
This may be a scarlet fever, will treat with amoxil Her CBC and ESR are normal so I don't think this is vasculitis Will check her Parvo-B19 titer as well

## 2014-01-28 NOTE — Assessment & Plan Note (Signed)
I think she may have a bacterial infection - possibly strep Will treat with amoxil

## 2014-01-29 ENCOUNTER — Encounter: Payer: Self-pay | Admitting: Internal Medicine

## 2014-01-29 LAB — SPEP & IFE WITH QIG
ALBUMIN ELP: 61.8 % (ref 55.8–66.1)
Alpha-1-Globulin: 4.3 % (ref 2.9–4.9)
Alpha-2-Globulin: 7.8 % (ref 7.1–11.8)
BETA GLOBULIN: 6.2 % (ref 4.7–7.2)
Beta 2: 5.7 % (ref 3.2–6.5)
Gamma Globulin: 14.2 % (ref 11.1–18.8)
IGA: 215 mg/dL (ref 69–380)
IGG (IMMUNOGLOBIN G), SERUM: 955 mg/dL (ref 690–1700)
IGM, SERUM: 49 mg/dL — AB (ref 52–322)
TOTAL PROTEIN, SERUM ELECTROPHOR: 6.8 g/dL (ref 6.0–8.3)

## 2014-01-30 LAB — PARVOVIRUS B19 IGM: PARVOVIRUS B19 IGM: 0.3 {index} (ref <0.9)

## 2014-02-20 ENCOUNTER — Encounter: Payer: Self-pay | Admitting: Internal Medicine

## 2014-02-20 ENCOUNTER — Ambulatory Visit (INDEPENDENT_AMBULATORY_CARE_PROVIDER_SITE_OTHER): Payer: BC Managed Care – PPO | Admitting: Internal Medicine

## 2014-02-20 VITALS — BP 128/88 | HR 78 | Temp 97.5°F | Resp 16 | Ht 64.0 in | Wt 217.5 lb

## 2014-02-20 DIAGNOSIS — G43001 Migraine without aura, not intractable, with status migrainosus: Secondary | ICD-10-CM

## 2014-02-20 MED ORDER — TOPIRAMATE 100 MG PO TABS: 100.0000 mg | ORAL_TABLET | Freq: Two times a day (BID) | ORAL | Status: DC

## 2014-02-20 MED ORDER — SUMATRIPTAN-NAPROXEN SODIUM 85-500 MG PO TABS: 1.0000 | ORAL_TABLET | ORAL | Status: DC | PRN

## 2014-02-20 NOTE — Progress Notes (Signed)
Pre visit review using our clinic review tool, if applicable. No additional management support is needed unless otherwise documented below in the visit note. 

## 2014-02-20 NOTE — Patient Instructions (Signed)

## 2014-02-20 NOTE — Assessment & Plan Note (Signed)
She is having more frequent headaches, will increase topamax to 100 mg BID Will change her triptan to treximet to see if the addition of an nsaid will help

## 2014-02-20 NOTE — Progress Notes (Signed)
Subjective:    Patient ID: Melanie DredgeJennifer A Codd, female    DOB: 04/15/1965, 49 y.o.   MRN: [id number]  HPI  She returns for f/up and tells me that the URI symptoms are much better, she a little hoarseness but otherwise she feels well. Today she complains about more frequent migraines, she has been taking maxalt and it controls the pain but her insurer limits her to 6 doses per month and she is having headaches more frequently than that. The headaches are not more severe or different compared to before and there are no new associated symptoms.   Review of Systems  Constitutional: Positive for fatigue. Negative for fever, chills, diaphoresis and appetite change.  HENT: Positive for voice change. Negative for congestion, sinus pressure, sore throat and trouble swallowing.   Eyes: Negative.   Respiratory: Negative.  Negative for cough, choking, chest tightness, shortness of breath and stridor.   Cardiovascular: Negative.  Negative for chest pain, palpitations and leg swelling.  Gastrointestinal: Negative.  Negative for nausea, vomiting, abdominal pain, diarrhea and constipation.  Endocrine: Negative.   Genitourinary: Negative.   Musculoskeletal: Negative.   Skin: Negative.  Negative for rash.  Allergic/Immunologic: Negative.   Neurological: Positive for headaches. Negative for dizziness, tremors, seizures, syncope, facial asymmetry, speech difficulty, weakness, light-headedness and numbness.  Hematological: Negative.  Negative for adenopathy. Does not bruise/bleed easily.  Psychiatric/Behavioral: Negative.  Negative for confusion, sleep disturbance, dysphoric mood and decreased concentration. The patient is not nervous/anxious.        Objective:   Physical Exam  Constitutional: She is oriented to person, place, and time. She appears well-developed and well-nourished. No distress.  HENT:  Head: Normocephalic and atraumatic.  Mouth/Throat: Oropharynx is clear and moist. No oropharyngeal exudate.   Eyes: Conjunctivae are normal. Right eye exhibits no discharge. Left eye exhibits no discharge. No scleral icterus.  Neck: Normal range of motion. Neck supple. No JVD present. No tracheal deviation present. No thyromegaly present.  Cardiovascular: Normal rate, regular rhythm, normal heart sounds and intact distal pulses.  Exam reveals no gallop and no friction rub.   No murmur heard. Pulmonary/Chest: Effort normal and breath sounds normal. No stridor. No respiratory distress. She has no wheezes. She has no rales. She exhibits no tenderness.  Abdominal: Soft. Bowel sounds are normal. She exhibits no distension and no mass. There is no tenderness. There is no rebound and no guarding.  Musculoskeletal: Normal range of motion. She exhibits no edema or tenderness.  Lymphadenopathy:    She has no cervical adenopathy.  Neurological: She is alert and oriented to person, place, and time. She has normal reflexes. She displays normal reflexes. No cranial nerve deficit. She exhibits normal muscle tone. Coordination normal.  Skin: Skin is warm and dry. No rash noted. She is not diaphoretic. No erythema. No pallor.  Psychiatric: She has a normal mood and affect. Her behavior is normal. Judgment and thought content normal.  Vitals reviewed.    Lab Results  Component Value Date   WBC 11.3* 01/27/2014   HGB 14.3 01/27/2014   HCT 42.8 01/27/2014   PLT 254.0 01/27/2014   GLUCOSE 93 01/27/2014   CHOL 169 11/13/2013   TRIG 242.0* 11/13/2013   HDL 35.40* 11/13/2013   LDLDIRECT 108.2 11/13/2013   LDLCALC 89 01/13/2012   ALT 15 01/27/2014   AST 16 01/27/2014   NA 140 01/27/2014   K 3.6 01/27/2014   CL 109 01/27/2014   CREATININE 0.8 01/27/2014   BUN 12 01/27/2014  CO2 24 01/27/2014   TSH 2.62 11/13/2013   HGBA1C 5.3 01/13/2012       Assessment & Plan:

## 2014-03-06 ENCOUNTER — Telehealth: Payer: Self-pay

## 2014-03-06 NOTE — Telephone Encounter (Signed)
PA for Treximet sent via cover my meds.

## 2014-03-12 NOTE — Telephone Encounter (Signed)
This is the first of the year and there is a major back up on PA. We are working hard to get these completed,

## 2014-03-12 NOTE — Telephone Encounter (Signed)
Pharmacy still saying this med has not gotten authorization , can we resend it?

## 2014-03-19 NOTE — Telephone Encounter (Signed)
PA has now been submitted x 2 and pending insurance determination.

## 2014-05-30 ENCOUNTER — Other Ambulatory Visit: Payer: Self-pay | Admitting: Internal Medicine

## 2014-05-30 DIAGNOSIS — Z1231 Encounter for screening mammogram for malignant neoplasm of breast: Secondary | ICD-10-CM

## 2014-06-19 ENCOUNTER — Ambulatory Visit
Admission: RE | Admit: 2014-06-19 | Discharge: 2014-06-19 | Disposition: A | Discharge status: Home / Self Care | Payer: BC Managed Care – PPO | Source: Ambulatory Visit | Attending: Internal Medicine | Admitting: Internal Medicine

## 2014-06-19 ENCOUNTER — Other Ambulatory Visit: Payer: Self-pay | Admitting: Obstetrics and Gynecology

## 2014-06-19 DIAGNOSIS — Z1231 Encounter for screening mammogram for malignant neoplasm of breast: Secondary | ICD-10-CM

## 2014-06-24 ENCOUNTER — Encounter: Payer: Self-pay | Admitting: Internal Medicine

## 2014-06-24 ENCOUNTER — Ambulatory Visit (INDEPENDENT_AMBULATORY_CARE_PROVIDER_SITE_OTHER): Payer: BC Managed Care – PPO | Admitting: Internal Medicine

## 2014-06-24 VITALS — BP 128/80 | HR 78 | Temp 98.6°F | Wt 222.4 lb

## 2014-06-24 DIAGNOSIS — F3340 Major depressive disorder, recurrent, in remission, unspecified: Secondary | ICD-10-CM

## 2014-06-24 DIAGNOSIS — J301 Allergic rhinitis due to pollen: Secondary | ICD-10-CM | POA: Diagnosis not present

## 2014-06-24 DIAGNOSIS — J019 Acute sinusitis, unspecified: Secondary | ICD-10-CM | POA: Diagnosis not present

## 2014-06-24 MED ORDER — PROMETHAZINE-DM 6.25-15 MG/5ML PO SYRP: 2.5000 mL | ORAL_SOLUTION | Freq: Four times a day (QID) | ORAL | Status: DC | PRN

## 2014-06-24 MED ORDER — LEVOFLOXACIN 250 MG PO TABS: 250.0000 mg | ORAL_TABLET | Freq: Every day | ORAL | Status: DC

## 2014-06-24 NOTE — Progress Notes (Signed)
Pre visit review using our clinic review tool, if applicable. No additional management support is needed unless otherwise documented below in the visit note. 

## 2014-06-24 NOTE — Progress Notes (Signed)
   Subjective:    Patient ID: [name], female    DOB: 12/27/1965, 49 y.o.   MRN: [id number]  HPI  Here with 2-3 days acute onset fever, facial pain, pressure, headache, general weakness and malaise, and greenish d/c, with mild ST and cough, but pt denies chest pain, wheezing, increased sob or doe, orthopnea, PND, increased LE swelling, palpitations, dizziness or syncope.  Does have several wks ongoing nasal allergy symptoms with clearish congestion, itch and sneezing, without fever, pain, ST, cough, swelling or wheezing. Denies worsening depressive symptoms, suicidal ideation, or panic Past Medical History  Diagnosis Date  . Migraines   . Kidney stones    Past Surgical History  Procedure Laterality Date  . Appendectomy    . Laporoscopy    . Wisdom tooth extraction    . Lithotripsy      reports that she has never smoked. She has never used smokeless tobacco. She reports that she does not drink alcohol or use illicit drugs. family history includes Alcohol abuse in her father and mother; Diabetes in her father and mother; Heart disease in her father. There is no history of Cancer, Stroke, Hyperlipidemia, Hypertension, Kidney disease, or Arthritis. Allergies  Allergen Reactions  . Hydrocodone-Homatropine Itching and Rash  . Sulfa Antibiotics Nausea And Vomiting  . Sulfacetamide Sodium    Current Outpatient Prescriptions on File Prior to Visit  Medication Sig Dispense Refill  . Riboflavin 400 MG TABS take 1 tablet by mouth once daily 30 tablet 11  . SUMAtriptan-naproxen (TREXIMET) 85-500 MG per tablet Take 1 tablet by mouth every 2 (two) hours as needed for migraine. 10 tablet 11  . topiramate (TOPAMAX) 100 MG tablet Take 1 tablet (100 mg total) by mouth 2 (two) times daily. 180 tablet 1  . venlafaxine XR (EFFEXOR-XR) 150 MG 24 hr capsule Take 1 capsule (150 mg total) by mouth daily. 90 capsule 3   No current facility-administered medications on file prior to visit.   Review of  Systems  Constitutional: Negative for unusual diaphoresis or night sweats HENT: Negative for ringing in ear or discharge Eyes: Negative for double vision or worsening visual disturbance.  Respiratory: Negative for choking and stridor.   Gastrointestinal: Negative for vomiting or other signifcant bowel change Genitourinary: Negative for hematuria or change in urine volume.  Musculoskeletal: Negative for other MSK pain or swelling Skin: Negative for color change and worsening wound.  Neurological: Negative for tremors and numbness other than noted  Psychiatric/Behavioral: Negative for decreased concentration or agitation other than above       Objective:   Physical Exam BP 128/80 mmHg  Pulse 78  Temp(Src) 98.6 F (37 C) (Oral)  Wt 222 lb 6.4 oz (100.88 kg)  SpO2 97% VS noted, mild ill Constitutional: Pt appears in no significant distress HENT: Head: NCAT.  Right Ear: External ear normal.  Left Ear: External ear normal.  Eyes: . Pupils are equal, round, and reactive to light. Conjunctivae and EOM are normal Bilat tm's with mild erythema.  Max sinus areas mild tender.  Pharynx with mild erythema, no exudate Neck: Normal range of motion. Neck supple. with bilat lymphadenopathy mild tender swelling submandibular Cardiovascular: Normal rate and regular rhythm.   Pulmonary/Chest: Effort normal and breath sounds without rales or wheezing.  Neurological: Pt is alert. Not confused , motor grossly intact Skin: Skin is warm. No rash, no LE edema Psychiatric: Pt behavior is normal. No agitation. not depressed affect    Assessment & Plan:

## 2014-06-24 NOTE — Patient Instructions (Addendum)
Please take all new medication as prescribed - the antibiotic, and cough medicine ° °Please continue all other medications as before, and refills have been done if requested. ° °Please have the pharmacy call with any other refills you may need. ° °Please continue your efforts at being more active, low cholesterol diet, and weight control. ° °Please keep your appointments with your specialists as you may have planned ° ° ° °

## 2014-06-25 NOTE — Assessment & Plan Note (Signed)
Mild to mod, for antibx course,  to f/u any worsening symptoms or concerns 

## 2014-06-25 NOTE — Assessment & Plan Note (Signed)
Mild to mod, for otc zyrtec/nasacort prn,  to f/u any worsening symptoms or concerns

## 2014-06-25 NOTE — Assessment & Plan Note (Signed)
stable overall by history and exam, and pt to continue medical treatment as before,  to f/u any worsening symptoms or concerns, verified no SI or HI 

## 2014-08-18 ENCOUNTER — Ambulatory Visit (INDEPENDENT_AMBULATORY_CARE_PROVIDER_SITE_OTHER)
Admission: RE | Admit: 2014-08-18 | Discharge: 2014-08-18 | Disposition: A | Discharge status: Home / Self Care | Payer: BC Managed Care – PPO | Source: Ambulatory Visit | Attending: Internal Medicine | Admitting: Internal Medicine

## 2014-08-18 ENCOUNTER — Ambulatory Visit (INDEPENDENT_AMBULATORY_CARE_PROVIDER_SITE_OTHER): Payer: BC Managed Care – PPO | Admitting: Internal Medicine

## 2014-08-18 ENCOUNTER — Encounter: Payer: Self-pay | Admitting: Internal Medicine

## 2014-08-18 ENCOUNTER — Other Ambulatory Visit (INDEPENDENT_AMBULATORY_CARE_PROVIDER_SITE_OTHER): Payer: BC Managed Care – PPO

## 2014-08-18 VITALS — BP 118/88 | HR 75 | Temp 97.7°F | Resp 16 | Ht 64.0 in | Wt 225.0 lb

## 2014-08-18 DIAGNOSIS — B349 Viral infection, unspecified: Secondary | ICD-10-CM

## 2014-08-18 DIAGNOSIS — R05 Cough: Secondary | ICD-10-CM

## 2014-08-18 DIAGNOSIS — R059 Cough, unspecified: Secondary | ICD-10-CM

## 2014-08-18 DIAGNOSIS — J301 Allergic rhinitis due to pollen: Secondary | ICD-10-CM | POA: Diagnosis not present

## 2014-08-18 LAB — COMPREHENSIVE METABOLIC PANEL
ALBUMIN: 3.9 g/dL (ref 3.5–5.2)
ALT: 14 U/L (ref 0–35)
AST: 16 U/L (ref 0–37)
Alkaline Phosphatase: 71 U/L (ref 39–117)
BUN: 7 mg/dL (ref 6–23)
CHLORIDE: 109 meq/L (ref 96–112)
CO2: 24 mEq/L (ref 19–32)
Calcium: 8.9 mg/dL (ref 8.4–10.5)
Creatinine, Ser: 0.69 mg/dL (ref 0.40–1.20)
GFR: 96.92 mL/min (ref >60.00)
Glucose, Bld: 96 mg/dL (ref 70–99)
Potassium: 3.3 mEq/L — ABNORMAL LOW (ref 3.5–5.1)
Sodium: 140 mEq/L (ref 135–145)
Total Bilirubin: 0.4 mg/dL (ref 0.2–1.2)
Total Protein: 6.6 g/dL (ref 6.0–8.3)

## 2014-08-18 LAB — CBC WITH DIFFERENTIAL/PLATELET
Basophils Absolute: 0 10*3/uL (ref 0.0–0.1)
Basophils Relative: 0.3 % (ref 0.0–3.0)
Eosinophils Absolute: 0.2 10*3/uL (ref 0.0–0.7)
Eosinophils Relative: 2.3 % (ref 0.0–5.0)
HCT: 41 % (ref 36.0–46.0)
Hemoglobin: 13.9 g/dL (ref 12.0–15.0)
Lymphocytes Relative: 22.8 % (ref 12.0–46.0)
Lymphs Abs: 2.3 10*3/uL (ref 0.7–4.0)
MCHC: 34 g/dL (ref 30.0–36.0)
MCV: 87.2 fl (ref 78.0–100.0)
Monocytes Absolute: 0.6 10*3/uL (ref 0.1–1.0)
Monocytes Relative: 6.2 % (ref 3.0–12.0)
Neutro Abs: 6.9 10*3/uL (ref 1.4–7.7)
Neutrophils Relative %: 68.4 % (ref 43.0–77.0)
Platelets: 241 10*3/uL (ref 150.0–400.0)
RBC: 4.7 Mil/uL (ref 3.87–5.11)
RDW: 13.7 % (ref 11.5–15.5)
WBC: 10.1 10*3/uL (ref 4.0–10.5)

## 2014-08-18 MED ORDER — PROMETHAZINE-DM 6.25-15 MG/5ML PO SYRP: 5.0000 mL | ORAL_SOLUTION | Freq: Four times a day (QID) | ORAL | Status: DC | PRN

## 2014-08-18 NOTE — Patient Instructions (Signed)
Upper Respiratory Infection, Adult An upper respiratory infection (URI) is also known as the common cold. It is often caused by a type of germ (virus). Colds are easily spread (contagious). You can pass it to others by kissing, coughing, sneezing, or drinking out of the same glass. Usually, you get better in 1 or 2 weeks.  HOME CARE   Only take medicine as told by your doctor.  Use a warm mist humidifier or breathe in steam from a hot shower.  Drink enough water and fluids to keep your pee (urine) clear or pale yellow.  Get plenty of rest.  Return to work when your temperature is back to normal or as told by your doctor. You may use a face mask and wash your hands to stop your cold from spreading. GET HELP RIGHT AWAY IF:   After the first few days, you feel you are getting worse.  You have questions about your medicine.  You have chills, shortness of breath, or brown or red spit (mucus).  You have yellow or brown snot (nasal discharge) or pain in the face, especially when you bend forward.  You have a fever, puffy (swollen) neck, pain when you swallow, or white spots in the back of your throat.  You have a bad headache, ear pain, sinus pain, or chest pain.  You have a high-pitched whistling sound when you breathe in and out (wheezing).  You have a lasting cough or cough up blood.  You have sore muscles or a stiff neck. MAKE SURE YOU:   Understand these instructions.  Will watch your condition.  Will get help right away if you are not doing well or get worse. Document Released: 07/13/2007 Document Revised: 04/18/2011 Document Reviewed: 05/01/2013 [employer] Patient Information 2015 [employer]. This information is not intended to replace advice given to you by your health care provider. Make sure you discuss any questions you have with your health care provider.  

## 2014-08-18 NOTE — Progress Notes (Signed)
Pre visit review using our clinic review tool, if applicable. No additional management support is needed unless otherwise documented below in the visit note. 

## 2014-08-19 ENCOUNTER — Telehealth: Payer: Self-pay | Admitting: Internal Medicine

## 2014-08-19 ENCOUNTER — Encounter: Payer: Self-pay | Admitting: Internal Medicine

## 2014-08-19 NOTE — Progress Notes (Signed)
Subjective:  Patient ID: [name], female    DOB: 12/02/1965  Age: 48 y.o. MRN: [id number]  CC: Cough   HPI [name] presents for a 3 day history of cough productive of clear phlegm with chills and a few episodes of diarrhea. She also has a runny nose and sneezing.  Outpatient Prescriptions Prior to Visit  Medication Sig Dispense Refill  . Riboflavin 400 MG TABS take 1 tablet by mouth once daily (Patient not taking: Reported on 08/18/2014) 30 tablet 11  . SUMAtriptan-naproxen (TREXIMET) 85-500 MG per tablet Take 1 tablet by mouth every 2 (two) hours as needed for migraine. (Patient not taking: Reported on 08/18/2014) 10 tablet 11  . topiramate (TOPAMAX) 100 MG tablet Take 1 tablet (100 mg total) by mouth 2 (two) times daily. (Patient not taking: Reported on 08/18/2014) 180 tablet 1  . venlafaxine XR (EFFEXOR-XR) 150 MG 24 hr capsule Take 1 capsule (150 mg total) by mouth daily. (Patient not taking: Reported on 08/18/2014) 90 capsule 3  . levofloxacin (LEVAQUIN) 250 MG tablet Take 1 tablet (250 mg total) by mouth daily. (Patient not taking: Reported on 08/18/2014) 10 tablet 0  . promethazine-dextromethorphan (PROMETHAZINE-DM) 6.25-15 MG/5ML syrup Take 2.5 mLs by mouth 4 (four) times daily as needed for cough. (Patient not taking: Reported on 08/18/2014) 118 mL 0   No facility-administered medications prior to visit.    ROS Review of Systems  Constitutional: Positive for chills. Negative for fever, diaphoresis, activity change, appetite change, fatigue and unexpected weight change.  HENT: Positive for congestion, postnasal drip and rhinorrhea. Negative for drooling, mouth sores, nosebleeds, sinus pressure, sneezing, sore throat, trouble swallowing and voice change.   Eyes: Negative.  Negative for visual disturbance.  Respiratory: Positive for cough. Negative for choking, chest tightness, shortness of breath, wheezing and stridor.   Cardiovascular: Negative.  Negative for chest pain,  palpitations and leg swelling.  Gastrointestinal: Positive for diarrhea. Negative for nausea, vomiting, abdominal pain, constipation, blood in stool and rectal pain.  Endocrine: Negative.   Genitourinary: Negative.   Musculoskeletal: Negative.   Skin: Negative.  Negative for color change and rash.  Allergic/Immunologic: Negative.   Neurological: Negative.  Negative for dizziness, syncope, speech difficulty, weakness, light-headedness, numbness and headaches.  Hematological: Negative.  Negative for adenopathy. Does not bruise/bleed easily.  Psychiatric/Behavioral: Negative.     Objective:  BP 118/88 mmHg  Pulse 75  Temp(Src) 97.7 F (36.5 C) (Oral)  Resp 16  Ht  (1.626 m)  Wt 225 lb (102.059 kg)  BMI 38.60 kg/m2  SpO2 99%  BP Readings from Last 3 Encounters:  08/18/14 118/88  06/24/14 128/80  02/20/14 128/88    Wt Readings from Last 3 Encounters:  08/18/14 225 lb (102.059 kg)  06/24/14 222 lb 6.4 oz (100.88 kg)  02/20/14 217 lb 8 oz (98.657 kg)    Physical Exam  Constitutional: She is oriented to person, place, and time. She appears well-developed and well-nourished.  Non-toxic appearance. She does not have a sickly appearance. She does not appear ill. No distress.  HENT:  Head: Normocephalic and atraumatic.  Nose: Mucosal edema and rhinorrhea present. No sinus tenderness or nasal deformity. Right sinus exhibits no maxillary sinus tenderness and no frontal sinus tenderness. Left sinus exhibits no maxillary sinus tenderness and no frontal sinus tenderness.  Mouth/Throat: Oropharynx is clear and moist. No oropharyngeal exudate.  Eyes: Conjunctivae are normal. Right eye exhibits no discharge. Left eye exhibits no discharge. No scleral icterus.  Neck: Normal  range of motion. Neck supple. No JVD present. No tracheal deviation present. No thyromegaly present.  Cardiovascular: Normal rate, regular rhythm, normal heart sounds and intact distal pulses.  Exam reveals no gallop  and no friction rub.   No murmur heard. Pulmonary/Chest: Effort normal and breath sounds normal. No stridor. No respiratory distress. She has no wheezes. She has no rales. She exhibits no tenderness.  Abdominal: Soft. Bowel sounds are normal. She exhibits no distension and no mass. There is no tenderness. There is no rebound and no guarding.  Musculoskeletal: Normal range of motion. She exhibits no edema or tenderness.  Lymphadenopathy:    She has no cervical adenopathy.  Neurological: She is oriented to person, place, and time.  Skin: Skin is warm and dry. No rash noted. She is not diaphoretic. No erythema. No pallor.  Psychiatric: She has a normal mood and affect. Her behavior is normal. Judgment and thought content normal.  Vitals reviewed.   Lab Results  Component Value Date   WBC 10.1 08/18/2014   HGB 13.9 08/18/2014   HCT 41.0 08/18/2014   PLT 241.0 08/18/2014   GLUCOSE 96 08/18/2014   CHOL 169 11/13/2013   TRIG 242.0* 11/13/2013   HDL 35.40* 11/13/2013   LDLDIRECT 108.2 11/13/2013   LDLCALC 89 01/13/2012   ALT 14 08/18/2014   AST 16 08/18/2014   NA 140 08/18/2014   K 3.3* 08/18/2014   CL 109 08/18/2014   CREATININE 0.69 08/18/2014   BUN 7 08/18/2014   CO2 24 08/18/2014   TSH 2.62 11/13/2013   HGBA1C 5.3 01/13/2012    Dg Chest 2 View  08/18/2014   CLINICAL DATA:  Cough for 2 days, chills  EXAM: CHEST  2 VIEW  COMPARISON:  12/24/2013  FINDINGS: Cardiomediastinal silhouette is stable. No acute infiltrate or pleural effusion. No pulmonary edema. Bony thorax is unremarkable.  IMPRESSION: No active cardiopulmonary disease.   Electronically Signed   By: [name] M.D.   On: 08/18/2014 16:48    Assessment & Plan:   [name] was seen today for cough.  Diagnoses and all orders for this visit:  Allergic rhinitis due to pollen- she has not responded well to multiple antihistamines and she will not do a nasal spray. Will refer to allergy. Orders: -     Ambulatory referral  to Allergy  Cough- her exam and chest x-ray are normal. This appears to be a viral upper respiratory infection. Will control the cough with Phenergan DM Orders: -     DG Chest 2 View; Future -     promethazine-dextromethorphan (PROMETHAZINE-DM) 6.25-15 MG/5ML syrup; Take 5 mLs by mouth 4 (four) times daily as needed for cough.  Viral syndrome- her exam and labs are normal. Will treat with symptomatic relief. Orders: -     CBC with Differential/Platelet; Future -     Comprehensive metabolic panel; Future -     promethazine-dextromethorphan (PROMETHAZINE-DM) 6.25-15 MG/5ML syrup; Take 5 mLs by mouth 4 (four) times daily as needed for cough.   I have discontinued Ms. Suppes's levofloxacin and promethazine-dextromethorphan. I am also having her start on promethazine-dextromethorphan. Additionally, I am having her maintain her venlafaxine XR, Riboflavin, SUMAtriptan-naproxen, and topiramate.  Meds ordered this encounter  Medications  . promethazine-dextromethorphan (PROMETHAZINE-DM) 6.25-15 MG/5ML syrup    Sig: Take 5 mLs by mouth 4 (four) times daily as needed for cough.    Dispense:  118 mL    Refill:  0     Follow-up: Return in about 3 weeks (  around 09/08/2014).  Sanda Lingerhomas Ellamay Fors, MD

## 2014-08-19 NOTE — Telephone Encounter (Signed)
Pt called in would like a call back with her lab and xray results

## 2014-08-20 NOTE — Telephone Encounter (Signed)
If she feels poorly she should consider coming in for recheck otherwise I recommend Motrin and Tylenol for the headache

## 2014-08-20 NOTE — Telephone Encounter (Signed)
Pt is also saying that her head is hurting and she needs to know what she can do to help with some of the symptoms  she is having .  Can someone please call her today.  She has called a couple of times?  I'm Sorry [name]!!!   Best number  [phone]

## 2014-08-21 NOTE — Telephone Encounter (Signed)
Left message asking patient to call back so that she can be advised of dr Yetta Barrejones note---if patient calls back, let tamara know

## 2014-08-21 NOTE — Telephone Encounter (Signed)
Patient called back, patient advised of dr Yetta Barrejones note---also advised patient of food triggers for migraines---patient stated she was feeling a little bit better, she did not think she needed zofran for nausea at this time, she is going to try taking topiramate tonight at bedtime and then either tylenol or motrin in morning---if not better in morning, patient advised to call back and schedule appt with dr Yetta Barrejones

## 2014-10-10 ENCOUNTER — Encounter: Payer: Self-pay | Admitting: Internal Medicine

## 2014-10-10 ENCOUNTER — Other Ambulatory Visit (INDEPENDENT_AMBULATORY_CARE_PROVIDER_SITE_OTHER): Payer: BC Managed Care – PPO

## 2014-10-10 ENCOUNTER — Ambulatory Visit (INDEPENDENT_AMBULATORY_CARE_PROVIDER_SITE_OTHER): Payer: BC Managed Care – PPO | Admitting: Internal Medicine

## 2014-10-10 VITALS — BP 118/84 | HR 71 | Temp 98.0°F | Resp 16 | Ht 64.0 in | Wt 231.0 lb

## 2014-10-10 DIAGNOSIS — R0609 Other forms of dyspnea: Secondary | ICD-10-CM | POA: Diagnosis not present

## 2014-10-10 DIAGNOSIS — G43001 Migraine without aura, not intractable, with status migrainosus: Secondary | ICD-10-CM

## 2014-10-10 DIAGNOSIS — F3341 Major depressive disorder, recurrent, in partial remission: Secondary | ICD-10-CM

## 2014-10-10 DIAGNOSIS — G43009 Migraine without aura, not intractable, without status migrainosus: Secondary | ICD-10-CM | POA: Diagnosis not present

## 2014-10-10 DIAGNOSIS — E876 Hypokalemia: Secondary | ICD-10-CM

## 2014-10-10 LAB — URINALYSIS, ROUTINE W REFLEX MICROSCOPIC
BILIRUBIN URINE: NEGATIVE
Hgb urine dipstick: NEGATIVE
Ketones, ur: NEGATIVE
NITRITE: NEGATIVE
PH: 6.5 (ref 5.0–8.0)
Specific Gravity, Urine: 1.01 (ref 1.000–1.030)
TOTAL PROTEIN, URINE-UPE24: NEGATIVE
Urine Glucose: NEGATIVE
Urobilinogen, UA: 0.2 (ref 0.0–1.0)

## 2014-10-10 LAB — CBC WITH DIFFERENTIAL/PLATELET
BASOS PCT: 0.2 % (ref 0.0–3.0)
Basophils Absolute: 0 10*3/uL (ref 0.0–0.1)
Eosinophils Absolute: 0.2 10*3/uL (ref 0.0–0.7)
Eosinophils Relative: 1.7 % (ref 0.0–5.0)
HEMATOCRIT: 40.2 % (ref 36.0–46.0)
Hemoglobin: 13.5 g/dL (ref 12.0–15.0)
LYMPHS PCT: 19.5 % (ref 12.0–46.0)
Lymphs Abs: 2 10*3/uL (ref 0.7–4.0)
MCHC: 33.7 g/dL (ref 30.0–36.0)
MCV: 88.9 fl (ref 78.0–100.0)
MONOS PCT: 6.2 % (ref 3.0–12.0)
Monocytes Absolute: 0.6 10*3/uL (ref 0.1–1.0)
NEUTROS PCT: 72.4 % (ref 43.0–77.0)
Neutro Abs: 7.3 10*3/uL (ref 1.4–7.7)
Platelets: 232 10*3/uL (ref 150.0–400.0)
RBC: 4.52 Mil/uL (ref 3.87–5.11)
RDW: 13.1 % (ref 11.5–15.5)
WBC: 10 10*3/uL (ref 4.0–10.5)

## 2014-10-10 LAB — BASIC METABOLIC PANEL
BUN: 13 mg/dL (ref 6–23)
CO2: 30 mEq/L (ref 19–32)
Calcium: 8.8 mg/dL (ref 8.4–10.5)
Chloride: 108 mEq/L (ref 96–112)
Creatinine, Ser: 1.07 mg/dL (ref 0.40–1.20)
GFR: 58.38 mL/min — AB (ref >60.00)
Glucose, Bld: 90 mg/dL (ref 70–99)
POTASSIUM: 4.2 meq/L (ref 3.5–5.1)
SODIUM: 143 meq/L (ref 135–145)

## 2014-10-10 LAB — BRAIN NATRIURETIC PEPTIDE: PRO B NATRI PEPTIDE: 22 pg/mL (ref 0.0–100.0)

## 2014-10-10 LAB — TSH: TSH: 3.98 u[IU]/mL (ref 0.35–4.50)

## 2014-10-10 LAB — D-DIMER, QUANTITATIVE: D-Dimer, Quant: 0.27 ug/mL-FEU (ref 0.00–0.48)

## 2014-10-10 LAB — MAGNESIUM: Magnesium: 2 mg/dL (ref 1.5–2.5)

## 2014-10-10 MED ORDER — SUMATRIPTAN-NAPROXEN SODIUM 85-500 MG PO TABS: 1.0000 | ORAL_TABLET | ORAL | Status: DC | PRN

## 2014-10-10 MED ORDER — VORTIOXETINE HBR 10 MG PO TABS: 1.0000 | ORAL_TABLET | Freq: Every day | ORAL | Status: DC

## 2014-10-10 NOTE — Progress Notes (Signed)
Pre visit review using our clinic review tool, if applicable. No additional management support is needed unless otherwise documented below in the visit note. 

## 2014-10-10 NOTE — Patient Instructions (Signed)

## 2014-10-10 NOTE — Progress Notes (Signed)
Subjective:  Patient ID: [name], female    DOB: August 09, 1965  Age: 49 y.o. MRN: [id number]  CC: Shortness of Breath   HPI [name] presents for follow-up and complains of dyspnea on exertion for the last 6 months. She also feels like she has intermittent lower extremity edema and complains of weight gain. She complains of mood swings, feeling irritable, sad and depressed. She has not taken Effexor in about 2-3 weeks.  Outpatient Prescriptions Prior to Visit  Medication Sig Dispense Refill  . Riboflavin 400 MG TABS take 1 tablet by mouth once daily 30 tablet 11  . topiramate (TOPAMAX) 100 MG tablet Take 1 tablet (100 mg total) by mouth 2 (two) times daily. 180 tablet 1  . promethazine-dextromethorphan (PROMETHAZINE-DM) 6.25-15 MG/5ML syrup Take 5 mLs by mouth 4 (four) times daily as needed for cough. 118 mL 0  . SUMAtriptan-naproxen (TREXIMET) 85-500 MG per tablet Take 1 tablet by mouth every 2 (two) hours as needed for migraine. (Patient not taking: Reported on 08/18/2014) 10 tablet 11  . venlafaxine XR (EFFEXOR-XR) 150 MG 24 hr capsule Take 1 capsule (150 mg total) by mouth daily. 90 capsule 3   No facility-administered medications prior to visit.    ROS Review of Systems  Constitutional: Negative.  Negative for fever, chills, diaphoresis, appetite change and fatigue.  HENT: Negative.  Negative for sinus pressure, trouble swallowing and voice change.   Eyes: Negative.   Respiratory: Positive for shortness of breath. Negative for apnea, cough, chest tightness, wheezing and stridor.   Cardiovascular: Positive for leg swelling. Negative for chest pain and palpitations.  Gastrointestinal: Negative.  Negative for nausea, vomiting, diarrhea and blood in stool.  Endocrine: Negative.   Genitourinary: Negative.  Negative for dysuria, urgency, hematuria, flank pain and difficulty urinating.  Musculoskeletal: Negative.  Negative for myalgias, back pain, joint swelling and  arthralgias.  Skin: Negative.   Allergic/Immunologic: Negative.   Neurological: Negative.  Negative for dizziness, tremors, syncope, light-headedness and numbness.  Hematological: Negative.   Psychiatric/Behavioral: Positive for sleep disturbance and dysphoric mood. Negative for suicidal ideas, hallucinations, behavioral problems, confusion, self-injury, decreased concentration and agitation. The patient is not nervous/anxious and is not hyperactive.        She stopped taking Effexor about 2-3 weeks ago. She complains of worsening anhedonia, irritability, anger, insomnia, weight gain and fatigue. She wants to restart an antidepressant.    Objective:  BP 118/84 mmHg  Pulse 71  Temp(Src) 98 F (36.7 C) (Oral)  Resp 16  Ht 5\' 4"  (1.626 m)  Wt 231 lb (104.781 kg)  BMI 39.63 kg/m2  SpO2 98%  BP Readings from Last 3 Encounters:  10/10/14 118/84  08/18/14 118/88  06/24/14 128/80    Wt Readings from Last 3 Encounters:  10/10/14 231 lb (104.781 kg)  08/18/14 225 lb (102.059 kg)  06/24/14 222 lb 6.4 oz (100.88 kg)    Physical Exam  Constitutional: She is oriented to person, place, and time. She appears well-developed and well-nourished. No distress.  HENT:  Head: Normocephalic and atraumatic.  Mouth/Throat: Oropharynx is clear and moist. No oropharyngeal exudate.  Eyes: Conjunctivae are normal. Right eye exhibits no discharge. Left eye exhibits no discharge. No scleral icterus.  Neck: Normal range of motion. Neck supple. No JVD present. No tracheal deviation present. No thyromegaly present.  Cardiovascular: Normal rate, regular rhythm, normal heart sounds and intact distal pulses.  Exam reveals no gallop and no friction rub.   No murmur heard. EKG today-  Sinus  Rhythm  Low voltage -possible pulmonary disease.   ABNORMAL - low voltage is consistent with her obese body habitus  Pulmonary/Chest: Effort normal and breath sounds normal. No stridor. No respiratory distress. She has no  wheezes. She has no rales. She exhibits no tenderness.  Abdominal: Soft. Bowel sounds are normal. She exhibits no distension and no mass. There is no tenderness. There is no rebound and no guarding.  Musculoskeletal: Normal range of motion. She exhibits no edema or tenderness.  There is no edema today.  Lymphadenopathy:    She has no cervical adenopathy.  Neurological: She is oriented to person, place, and time.  Skin: Skin is warm and dry. She is not diaphoretic.  Psychiatric: She has a normal mood and affect. Her behavior is normal. Judgment and thought content normal.  Vitals reviewed.   Lab Results  Component Value Date   WBC 10.0 10/10/2014   HGB 13.5 10/10/2014   HCT 40.2 10/10/2014   PLT 232.0 10/10/2014   GLUCOSE 90 10/10/2014   CHOL 169 11/13/2013   TRIG 242.0* 11/13/2013   HDL 35.40* 11/13/2013   LDLDIRECT 108.2 11/13/2013   LDLCALC 89 01/13/2012   ALT 14 08/18/2014   AST 16 08/18/2014   NA 143 10/10/2014   K 4.2 10/10/2014   CL 108 10/10/2014   CREATININE 1.07 10/10/2014   BUN 13 10/10/2014   CO2 30 10/10/2014   TSH 3.98 10/10/2014   HGBA1C 5.3 01/13/2012    Dg Chest 2 View  08/18/2014   CLINICAL DATA:  Cough for 2 days, chills  EXAM: CHEST  2 VIEW  COMPARISON:  12/24/2013  FINDINGS: Cardiomediastinal silhouette is stable. No acute infiltrate or pleural effusion. No pulmonary edema. Bony thorax is unremarkable.  IMPRESSION: No active cardiopulmonary disease.   Electronically Signed   By: Natasha Mead M.D.   On: 08/18/2014 16:48    Assessment & Plan:   [name] was seen today for shortness of breath.  Diagnoses and all orders for this visit:  Migraine without aura and without status migrainosus, not intractable -     SUMAtriptan-naproxen (TREXIMET) 85-500 MG per tablet; Take 1 tablet by mouth every 2 (two) hours as needed for migraine.  DOE (dyspnea on exertion)- her exam is unremarkable, her EKG shows a benign variation but nothing that is concerning. Her  labs are all normal-there is no evidence of fluid overload, pulmonary embolus, thyroid disease, anemia, renal insufficiency or abnormal electrolytes. I believe that her sensation of shortness of breath is related to her obesity and poor conditioning. I've asked her to be diligent about her lifestyle modifications with diet/exercise/weight loss. -     EKG 12-Lead -     Brain natriuretic peptide; Future -     Basic metabolic panel; Future -     Urinalysis, Routine w reflex microscopic (not at [facility]); Future -     TSH; Future -     CBC with Differential/Platelet; Future -     D-dimer, quantitative (not at [facility]); Future  Migraine without aura and with status migrainosus, not intractable  Hypokalemia- improvement noted -     Basic metabolic panel; Future -     Magnesium; Future  Recurrent major depression in partial remission- she never really had a very good response to the Effexor. This time will start Trintellix likes, I've given her samples of the 5 mg dose and have asked her to increase to the 10 mg dose after one week. She was given 3 weeks samples  of the 10 mg dose and then will consider increasing to 20 mg dose. -     Vortioxetine HBr (TRINTELLIX) 10 MG TABS; Take 1 tablet (10 mg total) by mouth daily.  I have discontinued Ms. Bridgeforth's venlafaxine XR and promethazine-dextromethorphan. I am also having her start on Vortioxetine HBr. Additionally, I am having her maintain her Riboflavin, topiramate, B-2, and SUMAtriptan-naproxen.  Meds ordered this encounter  Medications  . Riboflavin (B-2) 100 MG TABS    Sig:     Refill:  1  . SUMAtriptan-naproxen (TREXIMET) 85-500 MG per tablet    Sig: Take 1 tablet by mouth every 2 (two) hours as needed for migraine.    Dispense:  10 tablet    Refill:  11  . Vortioxetine HBr (TRINTELLIX) 10 MG TABS    Sig: Take 1 tablet (10 mg total) by mouth daily.    Dispense:  30 tablet    Refill:  3     Follow-up: Return in about 4 weeks (around  11/07/2014).  Sanda Linger, MD

## 2014-10-12 ENCOUNTER — Encounter: Payer: Self-pay | Admitting: Internal Medicine

## 2014-12-05 ENCOUNTER — Other Ambulatory Visit: Payer: Self-pay

## 2014-12-05 NOTE — Telephone Encounter (Signed)
Incoming fax for meds. Historical Med. Ok to fill with requested sig?

## 2014-12-06 MED ORDER — B-2 100 MG PO TABS: 100.0000 mg | ORAL_TABLET | Freq: Four times a day (QID) | ORAL | Status: DC

## 2014-12-23 ENCOUNTER — Institutional Professional Consult (permissible substitution): Payer: BC Managed Care – PPO | Admitting: Internal Medicine

## 2015-01-15 ENCOUNTER — Telehealth: Payer: Self-pay | Admitting: Internal Medicine

## 2015-01-15 NOTE — Telephone Encounter (Signed)
Patient Name: Melanie CastleJENNIFER Bart  DOB: 09/17/1965    Initial Comment Caller States thinks she had a panic attack, felt like walls were closing in on her, had to get out of the room. been under a lot of stress lately also been having headaches    Nurse Assessment  Nurse: Stefano GaulStringer, RN, Dwana CurdVera Date/Time (Eastern Time): 01/15/2015 12:18:42 PM  Confirm and document reason for call. If symptomatic, describe symptoms. ---Caller states she thinks she had a panic attack about 40 min ago. Felt like the walls were closing in. She felt like she had to get out where she was. She was angry and upset before the panic attack. She is crying. She has had headaches. She is having migraines more often than usual. No headache now. She takes something for depression. She has not taken Effexor for over a month and something else was prescribed and she could not take it.  Has the patient traveled out of the country within the last 30 days? ---Not Applicable  Does the patient have any new or worsening symptoms? ---Yes  Will a triage be completed? ---Yes  Related visit to physician within the last 2 weeks? ---No  Does the PT have any chronic conditions? (i.e. diabetes, asthma, etc.) ---Yes  List chronic conditions. ---depression,  Did the patient indicate they were pregnant? ---No  Is this a behavioral health or substance abuse call? ---No     Guidelines    Guideline Title Affirmed Question Affirmed Notes  Anxiety and Panic Attack Patient sounds very upset or troubled to the triager    Final Disposition User   See Physician within 24 Hours RoscoeStringer, RN, Dwana CurdVera    Comments  Appt scheduled for 01/16/15 at 11:15 am with Dr. Jeanine LuzGregory Calone.  Pt wants to know if there is something for her anxiety she can take before her appt at 11:15 am tomorrow. Please call her and let her know   Referrals  REFERRED TO PCP OFFICE   Disagree/Comply: Comply

## 2015-01-16 ENCOUNTER — Ambulatory Visit (INDEPENDENT_AMBULATORY_CARE_PROVIDER_SITE_OTHER): Payer: BC Managed Care – PPO | Admitting: Family

## 2015-01-16 ENCOUNTER — Encounter: Payer: Self-pay | Admitting: Family

## 2015-01-16 VITALS — BP 110/78 | HR 76 | Temp 97.5°F | Resp 18 | Ht 64.0 in | Wt 220.8 lb

## 2015-01-16 DIAGNOSIS — F322 Major depressive disorder, single episode, severe without psychotic features: Secondary | ICD-10-CM

## 2015-01-16 MED ORDER — VILAZODONE HCL 10 & 20 MG PO KIT: 10.0000 mg | PACK | Freq: Every day | ORAL | Status: DC

## 2015-01-16 NOTE — Progress Notes (Signed)
Subjective:    Patient ID: [name], female    DOB: [birth year], 49 y.o.   MRN: [id number]  Chief Complaint  Patient presents with  . Panic Attack    had an episode yesterday of feeling angry, felt like everything was closing in on her, ran to a bathroom and started crying, states she is under alot of stress at work and home    HPI:  [name] is a 49 y.o. female who  has a past medical history of Migraines and Kidney stones. and presents today for an acute office visit.  Has been dealing with depression since she was 49 years of age and has not been taking any medications for it. Yesterday she experienced the associated symptoms of wave of feeling upset and angry describing it as her body was going to explode while feeling like the rooms were closing in on her. She withdrew herself from the situation and went to a bathroom and was crying. She has never had an episode like this. She has been under multifactorial stress including work and family stressors. Most recently her daughter has been diagnosed with depression and has been defiant lately. She is also a single parent which adds to the stress. Does not currently have very many coping mechanisms but does eat to help relieve it. Denies any thoughts of suicide or homicide. Previously been on Effexor which she felt did not work and the Trintellex resulted in side effects including migraines and GI distress. She has also tried Prozac and wellbutrin as well.  Allergies  Allergen Reactions  . Hydrocodone-Homatropine Itching and Rash  . Sulfa Antibiotics Nausea And Vomiting  . Sulfacetamide Sodium     Current Outpatient Prescriptions on File Prior to Visit  Medication Sig Dispense Refill  . Riboflavin (B-2) 100 MG TABS Take 100 mg by mouth QID. 100 tablet 11  . Riboflavin 400 MG TABS take 1 tablet by mouth once daily 30 tablet 11  . SUMAtriptan-naproxen (TREXIMET) 85-500 MG per tablet Take 1 tablet by mouth every 2 (two) hours as  needed for migraine. 10 tablet 11  . topiramate (TOPAMAX) 100 MG tablet Take 1 tablet (100 mg total) by mouth 2 (two) times daily. 180 tablet 1   No current facility-administered medications on file prior to visit.    Review of Systems  Constitutional: Negative for fever and chills.  Psychiatric/Behavioral: Positive for sleep disturbance and dysphoric mood. Negative for suicidal ideas. The patient is not nervous/anxious.       Objective:    BP 110/78 mmHg  Pulse 76  Temp(Src) 97.5 F (36.4 C) (Oral)  Resp 18  Ht 5' 4" (1.626 m)  Wt 220 lb 12.8 oz (100.154 kg)  BMI 37.88 kg/m2  SpO2 99% Nursing note and vital signs reviewed.  Physical Exam  Constitutional: She is oriented to person, place, and time. She appears well-developed and well-nourished. No distress.  Cardiovascular: Normal rate, regular rhythm, normal heart sounds and intact distal pulses.   Pulmonary/Chest: Effort normal and breath sounds normal.  Neurological: She is alert and oriented to person, place, and time.  Skin: Skin is warm and dry.  Psychiatric: Her behavior is normal. Judgment and thought content normal. She exhibits a depressed mood.       Assessment & Plan:   Problem List Items Addressed This Visit      Other   Severe major depression ([place]) - Primary    Symptoms and exam consistent with severe major depression. Denies  suicidal ideation. Has been on numerous medication trials in the past. Start Viibryd. Recommend follow up with Behavioral Health for counseling which patient is willing and will call to make appointment. Follow up in 1 week to determine effectiveness of Viibryd or sooner if symptoms of suicidal ideation develop.       Relevant Medications   Vilazodone HCl (VIIBRYD STARTER PACK) 10 & 20 MG KIT

## 2015-01-16 NOTE — Assessment & Plan Note (Signed)
Symptoms and exam consistent with severe major depression. Denies suicidal ideation. Has been on numerous medication trials in the past. Start Viibryd. Recommend follow up with Behavioral Health for counseling which patient is willing and will call to make appointment. Follow up in 1 week to determine effectiveness of Viibryd or sooner if symptoms of suicidal ideation develop.

## 2015-01-16 NOTE — Patient Instructions (Signed)
Thank you for choosing [facility].  Summary/Instructions:  Your prescription(s) have been submitted to your pharmacy or been printed and provided for you. Please take as directed and contact our office if you believe you are having problem(s) with the medication(s) or have any questions.  If your symptoms worsen or fail to improve, please contact our office for further instruction, or in case of emergency go directly to the emergency room at the closest medical facility.   Please follow up in 1 week with results of medication.  Recommend following up with Behavioral Health for counseling.   Major Depressive Disorder Major depressive disorder is a mental illness. It also may be called clinical depression or unipolar depression. Major depressive disorder usually causes feelings of sadness, hopelessness, or helplessness. Some people with this disorder do not feel particularly sad but lose interest in doing things they used to enjoy (anhedonia). Major depressive disorder also can cause physical symptoms. It can interfere with work, school, relationships, and other normal everyday activities. The disorder varies in severity but is longer lasting and more serious than the sadness we all feel from time to time in our lives. Major depressive disorder often is triggered by stressful life events or major life changes. Examples of these triggers include divorce, loss of your job or home, a move, and the death of a family member or close friend. Sometimes this disorder occurs for no obvious reason at all. People who have family members with major depressive disorder or bipolar disorder are at higher risk for developing this disorder, with or without life stressors. Major depressive disorder can occur at any age. It may occur just once in your life (single episode major depressive disorder). It may occur multiple times (recurrent major depressive disorder). SYMPTOMS People with major depressive disorder  have either anhedonia or depressed mood on nearly a daily basis for at least 2 weeks or longer. Symptoms of depressed mood include:  Feelings of sadness (blue or down in the dumps) or emptiness.  Feelings of hopelessness or helplessness.  Tearfulness or episodes of crying (may be observed by others).  Irritability (children and adolescents). In addition to depressed mood or anhedonia or both, people with this disorder have at least four of the following symptoms:  Difficulty sleeping or sleeping too much.   Significant change (increase or decrease) in appetite or weight.   Lack of energy or motivation.  Feelings of guilt and worthlessness.   Difficulty concentrating, remembering, or making decisions.  Unusually slow movement (psychomotor retardation) or restlessness (as observed by others).   Recurrent wishes for death, recurrent thoughts of self-harm (suicide), or a suicide attempt. People with major depressive disorder commonly have persistent negative thoughts about themselves, other people, and the world. People with severe major depressive disorder may experiencedistorted beliefs or perceptions about the world (psychotic delusions). They also may see or hear things that are not real (psychotic hallucinations). DIAGNOSIS Major depressive disorder is diagnosed through an assessment by your health care provider. Your health care provider will ask aboutaspects of your daily life, such as mood,sleep, and appetite, to see if you have the diagnostic symptoms of major depressive disorder. Your health care provider may ask about your medical history and use of alcohol or drugs, including prescription medicines. Your health care provider also may do a physical exam and blood work. This is because certain medical conditions and the use of certain substances can cause major depressive disorder-like symptoms (secondary depression). Your health care provider also may refer you  to a mental  health specialist for further evaluation and treatment. TREATMENT It is important to recognize the symptoms of major depressive disorder and seek treatment. The following treatments can be prescribed for this disorder:   Medicine. Antidepressant medicines usually are prescribed. Antidepressant medicines are thought to correct chemical imbalances in the brain that are commonly associated with major depressive disorder. Other types of medicine may be added if the symptoms do not respond to antidepressant medicines alone or if psychotic delusions or hallucinations occur.  Talk therapy. Talk therapy can be helpful in treating major depressive disorder by providing support, education, and guidance. Certain types of talk therapy also can help with negative thinking (cognitive behavioral therapy) and with relationship issues that trigger this disorder (interpersonal therapy). A mental health specialist can help determine which treatment is best for you. Most people with major depressive disorder do well with a combination of medicine and talk therapy. Treatments involving electrical stimulation of the brain can be used in situations with extremely severe symptoms or when medicine and talk therapy do not work over time. These treatments include electroconvulsive therapy, transcranial magnetic stimulation, and vagal nerve stimulation.   This information is not intended to replace advice given to you by your health care provider. Make sure you discuss any questions you have with your health care provider.   Document Released: 05/21/2012 Document Revised: 02/14/2014 Document Reviewed: 05/21/2012 Elsevier Interactive Patient Education [employer]2016 [employer].

## 2015-01-16 NOTE — Progress Notes (Signed)
Pre visit review using our clinic review tool, if applicable. No additional management support is needed unless otherwise documented below in the visit note. 

## 2015-01-19 ENCOUNTER — Ambulatory Visit: Payer: BC Managed Care – PPO | Admitting: Internal Medicine

## 2015-01-19 ENCOUNTER — Encounter: Payer: Self-pay | Admitting: Family

## 2015-01-20 ENCOUNTER — Other Ambulatory Visit: Payer: Self-pay | Admitting: Family

## 2015-01-20 MED ORDER — MIRTAZAPINE 15 MG PO TABS: 15.0000 mg | ORAL_TABLET | Freq: Every day | ORAL | Status: DC

## 2015-04-01 ENCOUNTER — Other Ambulatory Visit: Payer: Self-pay | Admitting: Family

## 2015-04-01 ENCOUNTER — Encounter: Payer: Self-pay | Admitting: Family

## 2015-04-01 DIAGNOSIS — G43001 Migraine without aura, not intractable, with status migrainosus: Secondary | ICD-10-CM

## 2015-04-01 MED ORDER — TOPIRAMATE 100 MG PO TABS: 100.0000 mg | ORAL_TABLET | Freq: Two times a day (BID) | ORAL | Status: DC

## 2015-04-03 ENCOUNTER — Ambulatory Visit (INDEPENDENT_AMBULATORY_CARE_PROVIDER_SITE_OTHER): Payer: BC Managed Care – PPO | Admitting: Internal Medicine

## 2015-04-03 ENCOUNTER — Encounter: Payer: Self-pay | Admitting: Internal Medicine

## 2015-04-03 ENCOUNTER — Other Ambulatory Visit: Payer: BC Managed Care – PPO

## 2015-04-03 VITALS — BP 116/74 | HR 82 | Ht 64.0 in | Wt 227.8 lb

## 2015-04-03 DIAGNOSIS — G4733 Obstructive sleep apnea (adult) (pediatric): Secondary | ICD-10-CM | POA: Diagnosis not present

## 2015-04-03 DIAGNOSIS — J309 Allergic rhinitis, unspecified: Secondary | ICD-10-CM | POA: Diagnosis not present

## 2015-04-03 DIAGNOSIS — J302 Other seasonal allergic rhinitis: Secondary | ICD-10-CM

## 2015-04-03 DIAGNOSIS — J3089 Other allergic rhinitis: Principal | ICD-10-CM

## 2015-04-03 NOTE — Assessment & Plan Note (Signed)
She describes perennial nasal congestion and was skin tested remotely indicating compatible symptoms years ago. Not clear if she is describing throat congestion and hoarseness due to self-limited viral pharyngitis, episodic allergic rhinitis and throat irritation, occasional reflux or a combination of these. Plan-in vitro allergy testing. Consider Nasalcrom nasal spray since she didn't tolerate steroid sprays. Consider Allegra since she didn't find Claritin helpful and says Zyrtec no longer helps as it did.

## 2015-04-03 NOTE — Patient Instructions (Addendum)
Order- lab- Allergy profile, Food IgE profile, alpha-gal        Dx seasonal and perennial allergic rhinitis  Order- unattended Home Sleep Test     Dx OSA Changed to Split night due to insurance   Consider trying otc antihistamine Alegra/fexofenadine if needed for nasal congestion, drainage  Also consider otc nasal spray nasalcrom/cromolyn/cromol

## 2015-04-03 NOTE — Progress Notes (Signed)
04/03/2015-50 year old female never smoker referred courtesy of Dr Sanda Linger; throat issues Intermittent episodes of sustained hoarseness and throat "congestion" especially Fall/Winter/Spring Loud snorer, always tired. Wakes herself snoring. Throat "issues" over the last 2 years or so described as throat congestion and hoarseness. Episodes come on most often in fall, winter or spring and tender last week or so at a time. She doesn't recognize a direct trigger but suggests sometimes of May, after sitting outside watching her son's soccer games. Perennial nasal drainage. Allergy skin testing many years ago remembered as positive for dust trees and molds with no allergy vaccine. Doesn't like steroid nasal sprays "burn my nose". Aware that she is a loud snorer and has woken herself snoring. Always feels tired. Not told if she stops breathing. No history of asthma, heart or lung disease. Does notice occasional acid heartburn.  Prior to Admission medications   Medication Sig Start Date End Date Taking? Authorizing Provider  ibuprofen (ADVIL,MOTRIN) 200 MG tablet Take 200 mg by mouth every 6 (six) hours as needed.   Yes Historical Provider, MD  mirtazapine (REMERON) 15 MG tablet Take 1 tablet (15 mg total) by mouth at bedtime. 01/20/15  Yes Veryl Speak, FNP  Riboflavin (B-2) 100 MG TABS Take 100 mg by mouth QID. 12/06/14  Yes Etta Grandchild, MD  SUMAtriptan-naproxen (TREXIMET) 85-500 MG per tablet Take 1 tablet by mouth every 2 (two) hours as needed for migraine. 10/10/14  Yes Etta Grandchild, MD  topiramate (TOPAMAX) 100 MG tablet Take 1 tablet (100 mg total) by mouth 2 (two) times daily. 04/01/15  Yes Veryl Speak, FNP   Past Medical History  Diagnosis Date  . Migraines   . Kidney stones    Past Surgical History  Procedure Laterality Date  . Appendectomy    . Laporoscopy    . Wisdom tooth extraction    . Lithotripsy     Family History  Problem Relation Age of Onset  . Diabetes Mother    . Alcohol abuse Mother   . Diabetes Father   . Heart disease Father   . Alcohol abuse Father   . Cancer Neg Hx   . Stroke Neg Hx   . Hyperlipidemia Neg Hx   . Hypertension Neg Hx   . Kidney disease Neg Hx   . Arthritis Neg Hx    Social History   Social History  . Marital Status: Legally Separated    Spouse Name: N/A  . Number of Children: N/A  . Years of Education: N/A   Occupational History  . Not on file.   Social History Main Topics  . Smoking status: Never Smoker   . Smokeless tobacco: Never Used  . Alcohol Use: No  . Drug Use: No  . Sexual Activity: Not Currently    Birth Control/ Protection: IUD   Other Topics Concern  . Not on file   Social History Narrative   ROS-see HPI   Negative unless "+" Constitutional:    weight loss, night sweats, fevers, chills, fatigue, lassitude. HEENT:    headaches, difficulty swallowing, tooth/dental problems, sore throat,       sneezing, itching, ear ache, sinus nasal congestion, post nasal drip, snoring CV:    chest pain, orthopnea, PND, swelling in lower extremities, anasarca,  dizziness, palpitations Resp:   shortness of breath with exertion or at rest.                productive cough,   non-productive cough, coughing up of blood.              change in color of mucus.  wheezing.   Skin:    rash or lesions. GI:  No-   heartburn, indigestion, abdominal pain, nausea, vomiting, diarrhea,                 change in bowel habits, loss of appetite GU: dysuria, change in color of urine, no urgency or frequency.   flank pain. MS:   joint pain, stiffness, decreased range of motion, back pain. Neuro-     nothing unusual Psych:  change in mood or affect.  depression or anxiety.   memory loss.  OBJ- Physical Exam General- Alert, Oriented, Affect-appropriate, Distress- none acute, + obese Skin- rash-none, lesions- none, excoriation- none Lymphadenopathy- none Head- atraumatic             Eyes- Gross vision intact, PERRLA, conjunctivae and secretions clear            Ears- Hearing, canals-normal            Nose- + mild turbinate edema, no-Septal dev, mucus, polyps, erosion, perforation             Throat- Mallampati IV , mucosa clear , drainage- none, tonsils- atrophic Neck- flexible , trachea midline, no stridor , thyroid nl, carotid no bruit Chest - symmetrical excursion , unlabored           Heart/CV- RRR , no murmur , no gallop  , no rub, nl s1 s2                           - JVD- none , edema- none, stasis changes- none, varices- none           Lung- clear to P&A, wheeze- none, cough- none , dullness-none, rub- none           Chest wall-  Abd-  Br/ Gen/ Rectal- Not done, not indicated Extrem- cyanosis- none, clubbing, none, atrophy- none, strength- nl Neuro- grossly intact to observation

## 2015-04-03 NOTE — Telephone Encounter (Signed)
Pt came in regarding the email. She is out of the mirtazapine (REMERON) 15 MG tablet [[id number] and she does want a refill for this. Pharmacy is [facility] on [address].

## 2015-04-03 NOTE — Assessment & Plan Note (Signed)
Physical exam and history suggest obstructive sleep apnea. She is alone with no witness now. Plan-schedule sleep study

## 2015-04-06 LAB — RESPIRATORY ALLERGY PROFILE REGION II ~~LOC~~
Allergen, Cedar tree, t12: <0.1 kU/L
Allergen, Cottonwood, t14: <0.1 kU/L
Allergen, D pternoyssinus,d7: <0.1 kU/L
Allergen, Mouse Urine Protein, e78: <0.1 kU/L
Allergen, Mulberry, t76: <0.1 kU/L
Aspergillus fumigatus, m3: <0.1 kU/L
Bermuda Grass: <0.1 kU/L
Box Elder IgE: <0.1 kU/L
Cat Dander: <0.1 kU/L
Cladosporium Herbarum: <0.1 kU/L
Cockroach: <0.1 kU/L
D. farinae: <0.1 kU/L
Dog Dander: <0.1 kU/L
Elm IgE: <0.1 kU/L
IgE (Immunoglobulin E), Serum: 6 kU/L (ref <115)
Johnson Grass: <0.1 kU/L
Pecan/Hickory Tree IgE: <0.1 kU/L
Penicillium Notatum: <0.1 kU/L
Timothy Grass: <0.1 kU/L

## 2015-04-06 LAB — FOOD ALLERGY PROFILE
Allergen, Salmon, f41: <0.1 kU/L
Almonds: <0.1 kU/L
Cashew IgE: <0.1 kU/L
Fish Cod: <0.1 kU/L
Milk IgE: <0.1 kU/L
Scallop IgE: <0.1 kU/L
Soybean IgE: <0.1 kU/L
Walnut: <0.1 kU/L

## 2015-04-08 LAB — GALACTOSE-ALPHA-1,3-GALACTOSE IGE

## 2015-04-10 ENCOUNTER — Encounter: Payer: Self-pay | Admitting: Family

## 2015-04-12 MED ORDER — MIRTAZAPINE 15 MG PO TABS: 15.0000 mg | ORAL_TABLET | Freq: Every day | ORAL | Status: DC

## 2015-04-28 ENCOUNTER — Encounter: Payer: Self-pay | Admitting: Internal Medicine

## 2015-04-28 ENCOUNTER — Ambulatory Visit (INDEPENDENT_AMBULATORY_CARE_PROVIDER_SITE_OTHER): Payer: BC Managed Care – PPO | Admitting: Internal Medicine

## 2015-04-28 VITALS — BP 118/82 | HR 72 | Temp 98.3°F | Resp 16 | Ht 64.0 in | Wt 225.0 lb

## 2015-04-28 DIAGNOSIS — B9562 Methicillin resistant Staphylococcus aureus infection as the cause of diseases classified elsewhere: Secondary | ICD-10-CM | POA: Diagnosis not present

## 2015-04-28 DIAGNOSIS — J340 Abscess, furuncle and carbuncle of nose: Secondary | ICD-10-CM

## 2015-04-28 DIAGNOSIS — L0390 Cellulitis, unspecified: Secondary | ICD-10-CM

## 2015-04-28 MED ORDER — DOXYCYCLINE HYCLATE 100 MG PO TBEC: 100.0000 mg | DELAYED_RELEASE_TABLET | Freq: Two times a day (BID) | ORAL | Status: AC

## 2015-04-28 MED ORDER — RIFAMPIN 300 MG PO CAPS: 300.0000 mg | ORAL_CAPSULE | Freq: Every day | ORAL | Status: AC

## 2015-04-28 NOTE — Progress Notes (Signed)
Pre visit review using our clinic review tool, if applicable. No additional management support is needed unless otherwise documented below in the visit note. 

## 2015-04-28 NOTE — Patient Instructions (Signed)

## 2015-04-29 NOTE — Progress Notes (Signed)
Subjective:  Patient ID: [name], female    DOB: 09/26/1965  Age: 50 y.o. MRN: [id number]  CC: Facial Swelling   HPI [name] presents for a 1-2 week history of painful, red, swollen nose mostly on the left side. She denies nosebleeds, headache, nausea, vomiting, fever, or chills.  Outpatient Prescriptions Prior to Visit  Medication Sig Dispense Refill  . ibuprofen (ADVIL,MOTRIN) 200 MG tablet Take 200 mg by mouth every 6 (six) hours as needed.    . mirtazapine (REMERON) 15 MG tablet Take 1 tablet (15 mg total) by mouth at bedtime. 90 tablet 0  . Riboflavin (B-2) 100 MG TABS Take 100 mg by mouth QID. 100 tablet 11  . SUMAtriptan-naproxen (TREXIMET) 85-500 MG per tablet Take 1 tablet by mouth every 2 (two) hours as needed for migraine. 10 tablet 11  . topiramate (TOPAMAX) 100 MG tablet Take 1 tablet (100 mg total) by mouth 2 (two) times daily. 180 tablet 1   No facility-administered medications prior to visit.    ROS Review of Systems  Constitutional: Negative.  Negative for fever, chills, appetite change and fatigue.  HENT: Positive for facial swelling and sinus pressure (left side). Negative for congestion, postnasal drip, rhinorrhea, sneezing, sore throat, tinnitus and trouble swallowing.   Eyes: Negative.   Respiratory: Negative.  Negative for cough, choking, shortness of breath and stridor.   Cardiovascular: Negative.  Negative for chest pain, palpitations and leg swelling.  Gastrointestinal: Negative.  Negative for abdominal pain.  Endocrine: Negative.   Genitourinary: Negative.   Musculoskeletal: Negative.   Skin: Positive for color change. Negative for rash.  Allergic/Immunologic: Negative.   Neurological: Negative.   Hematological: Negative.  Negative for adenopathy. Does not bruise/bleed easily.  Psychiatric/Behavioral: Negative.     Objective:  BP 118/82 mmHg  Pulse 72  Temp(Src) 98.3 F (36.8 C) (Oral)  Resp 16  Ht 5\' 4"  (1.626 m)  Wt 225 lb  (102.059 kg)  BMI 38.60 kg/m2  SpO2 97%  BP Readings from Last 3 Encounters:  04/28/15 118/82  04/03/15 116/74  01/16/15 110/78    Wt Readings from Last 3 Encounters:  04/28/15 225 lb (102.059 kg)  04/03/15 227 lb 12.8 oz (103.329 kg)  01/16/15 220 lb 12.8 oz (100.154 kg)    Physical Exam  Constitutional: She is oriented to person, place, and time.  Non-toxic appearance. She does not have a sickly appearance. She does not appear ill. No distress.  HENT:  Nose: Mucosal edema and sinus tenderness present. No rhinorrhea, nasal deformity, septal deviation or nasal septal hematoma. No epistaxis.  No foreign bodies. Right sinus exhibits no maxillary sinus tenderness and no frontal sinus tenderness. Left sinus exhibits maxillary sinus tenderness. Left sinus exhibits no frontal sinus tenderness.    Mouth/Throat: Oropharynx is clear and moist. No oropharyngeal exudate.  Left nasopharynx is mildly erythematous and swollen. There is no septal hematoma or abscess.  Eyes: Conjunctivae are normal. Right eye exhibits no discharge. Left eye exhibits no discharge. No scleral icterus.  Neck: Normal range of motion. Neck supple. No JVD present. No tracheal deviation present. No thyromegaly present.  Cardiovascular: Normal rate, regular rhythm, normal heart sounds and intact distal pulses.  Exam reveals no gallop and no friction rub.   No murmur heard. Pulmonary/Chest: Effort normal and breath sounds normal. No stridor. No respiratory distress. She has no wheezes. She has no rales. She exhibits no tenderness.  Abdominal: Soft. Bowel sounds are normal. She exhibits no distension and no  mass. There is no tenderness. There is no rebound and no guarding.  Musculoskeletal: She exhibits no edema or tenderness.  Lymphadenopathy:    She has no cervical adenopathy.  Neurological: She is oriented to person, place, and time.  Skin: Skin is warm and dry. No rash noted. She is not diaphoretic. There is erythema. No  pallor.  Vitals reviewed.   Lab Results  Component Value Date   WBC 10.0 10/10/2014   HGB 13.5 10/10/2014   HCT 40.2 10/10/2014   PLT 232.0 10/10/2014   GLUCOSE 90 10/10/2014   CHOL 169 11/13/2013   TRIG 242.0* 11/13/2013   HDL 35.40* 11/13/2013   LDLDIRECT 108.2 11/13/2013   LDLCALC 89 01/13/2012   ALT 14 08/18/2014   AST 16 08/18/2014   NA 143 10/10/2014   K 4.2 10/10/2014   CL 108 10/10/2014   CREATININE 1.07 10/10/2014   BUN 13 10/10/2014   CO2 30 10/10/2014   TSH 3.98 10/10/2014   HGBA1C 5.3 01/13/2012    Dg Chest 2 View  08/18/2014  CLINICAL DATA:  Cough for 2 days, chills EXAM: CHEST  2 VIEW COMPARISON:  12/24/2013 FINDINGS: Cardiomediastinal silhouette is stable. No acute infiltrate or pleural effusion. No pulmonary edema. Bony thorax is unremarkable. IMPRESSION: No active cardiopulmonary disease. Electronically Signed   By: Natasha Mead M.D.   On: 08/18/2014 16:48    Assessment & Plan:   [name] was seen today for facial swelling.  Diagnoses and all orders for this visit:  Cellulitis of nasal tip- her signs and symptoms are suspicious for an MRSA cellulitis in the nose, will treat with double antibiotic therapy using rifampin to treat the infection and eradicate any reservoir and doxycycline to treat the infection. -     rifampin (RIFADIN) 300 MG capsule; Take 1 capsule (300 mg total) by mouth daily. -     doxycycline (DORYX) 100 MG EC tablet; Take 1 tablet (100 mg total) by mouth 2 (two) times daily.  MRSA cellulitis- as above -     rifampin (RIFADIN) 300 MG capsule; Take 1 capsule (300 mg total) by mouth daily. -     doxycycline (DORYX) 100 MG EC tablet; Take 1 tablet (100 mg total) by mouth 2 (two) times daily.   I am having [name] start on rifampin and doxycycline. I am also having her maintain her SUMAtriptan-naproxen, B-2, topiramate, ibuprofen, and mirtazapine.  Meds ordered this encounter  Medications  . rifampin (RIFADIN) 300 MG capsule    Sig:  Take 1 capsule (300 mg total) by mouth daily.    Dispense:  20 capsule    Refill:  1  . doxycycline (DORYX) 100 MG EC tablet    Sig: Take 1 tablet (100 mg total) by mouth 2 (two) times daily.    Dispense:  20 tablet    Refill:  1     Follow-up: Return in about 2 weeks (around 05/12/2015).  Sanda Linger, MD

## 2015-06-07 ENCOUNTER — Ambulatory Visit (HOSPITAL_BASED_OUTPATIENT_CLINIC_OR_DEPARTMENT_OTHER): Payer: BC Managed Care – PPO | Attending: Internal Medicine | Admitting: Internal Medicine

## 2015-06-07 VITALS — Ht 64.0 in | Wt 226.0 lb

## 2015-06-07 DIAGNOSIS — Z79899 Other long term (current) drug therapy: Secondary | ICD-10-CM | POA: Insufficient documentation

## 2015-06-07 DIAGNOSIS — R0683 Snoring: Secondary | ICD-10-CM | POA: Diagnosis not present

## 2015-06-07 DIAGNOSIS — G4733 Obstructive sleep apnea (adult) (pediatric): Secondary | ICD-10-CM | POA: Insufficient documentation

## 2015-06-13 DIAGNOSIS — G4733 Obstructive sleep apnea (adult) (pediatric): Secondary | ICD-10-CM | POA: Diagnosis not present

## 2015-06-13 NOTE — Procedures (Signed)
   Patient Name: [name], [name] Study Date: 06/07/2015 Gender: Female D.O.B: 12-Mar-1965 Age (years): [age] Referring Provider: Jetty Duhamellinton Young MD, ABSM Height (inches): 64 Interpreting Physician: Jetty Duhamellinton Young MD, ABSM Weight (lbs): 226 RPSGT: Celene KrasCharles, Nicole BMI: 39 MRN: [id number] Neck Size: 16.00 CLINICAL INFORMATION Sleep Study Type: NPSG Indication for sleep study: OSA Epworth Sleepiness Score: 10  SLEEP STUDY TECHNIQUE As per the AASM Manual for the Scoring of Sleep and Associated Events v2.3 (April 2016) with a hypopnea requiring 4% desaturations. The channels recorded and monitored were frontal, central and occipital EEG, electrooculogram (EOG), submentalis EMG (chin), nasal and oral airflow, thoracic and abdominal wall motion, anterior tibialis EMG, snore microphone, electrocardiogram, and pulse oximetry.  MEDICATIONS Patient's medications include: charted for review Medications self-administered by patient during sleep study : Remeron  SLEEP ARCHITECTURE The study was initiated at 9:40:12 PM and ended at 4:18:22 AM. Sleep onset time was 28.1 minutes and the sleep efficiency was 80.5%. The total sleep time was 320.5 minutes. Stage REM latency was 120.5 minutes. The patient spent 2.65% of the night in stage N1 sleep, 77.69% in stage N2 sleep, 0.00% in stage N3 and 19.66% in REM. Alpha intrusion was absent. Wake after sleep onset 49 minutes  RESPIRATORY PARAMETERS The overall apnea/hypopnea index (AHI) was 1.1 per hour. There were 0 total apneas, including 0 obstructive, 0 central and 0 mixed apneas. There were 6 hypopneas and 4 RERAs. The AHI during Stage REM sleep was 3.8 per hour. AHI while supine was 2.9 per hour. The mean oxygen saturation was 95.84%. The minimum SpO2 during sleep was 86.00%. Moderate snoring was noted during this study.  CARDIAC DATA The 2 lead EKG demonstrated sinus rhythm. The mean heart rate was 39.10 beats per minute. Other EKG findings  include: None.  LEG MOVEMENT DATA The total PLMS were 38 with a resulting PLMS index of 7.11. Associated arousal with leg movement index was 2.1 .  IMPRESSIONS - No significant obstructive sleep apnea occurred during this study (AHI = 1.1/h). - Tere were not enough events for split CPAP protocol. Study completed as diagnostic NPSG. - No significant central sleep apnea occurred during this study (CAI = 0.0/h). - Mild oxygen desaturation was noted during this study (Min O2 = 86.00%). - The patient snored with Moderate snoring volume. - No cardiac abnormalities were noted during this study. - Mild periodic limb movements of sleep occurred during the study. No significant associated arousals.  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 - Sleep hygiene should be reviewed to assess factors that may improve sleep quality. - Weight management and regular exercise should be initiated or continued if appropriate.  Waymon BudgeYOUNG,CLINTON D Diplomate, American Board of Sleep Medicine  ELECTRONICALLY SIGNED ON:  06/13/2015, 12:09 PM [facility] PH: [phone]   FX: [phone] ACCREDITED BY THE AMERICAN ACADEMY OF SLEEP MEDICINE

## 2015-06-15 ENCOUNTER — Emergency Department (HOSPITAL_COMMUNITY)
Admission: EM | Admit: 2015-06-15 | Discharge: 2015-06-16 | Disposition: A | Discharge status: Home / Self Care | Payer: BC Managed Care – PPO | Attending: Emergency Medicine | Admitting: Emergency Medicine

## 2015-06-15 ENCOUNTER — Encounter (HOSPITAL_COMMUNITY): Payer: Self-pay | Admitting: *Deleted

## 2015-06-15 ENCOUNTER — Emergency Department (HOSPITAL_COMMUNITY): Payer: BC Managed Care – PPO

## 2015-06-15 DIAGNOSIS — Y9389 Activity, other specified: Secondary | ICD-10-CM | POA: Insufficient documentation

## 2015-06-15 DIAGNOSIS — Z79899 Other long term (current) drug therapy: Secondary | ICD-10-CM | POA: Insufficient documentation

## 2015-06-15 DIAGNOSIS — S0093XA Contusion of unspecified part of head, initial encounter: Secondary | ICD-10-CM

## 2015-06-15 DIAGNOSIS — Y998 Other external cause status: Secondary | ICD-10-CM | POA: Diagnosis not present

## 2015-06-15 DIAGNOSIS — S40012A Contusion of left shoulder, initial encounter: Secondary | ICD-10-CM | POA: Diagnosis not present

## 2015-06-15 DIAGNOSIS — G43909 Migraine, unspecified, not intractable, without status migrainosus: Secondary | ICD-10-CM | POA: Diagnosis not present

## 2015-06-15 DIAGNOSIS — Z87442 Personal history of urinary calculi: Secondary | ICD-10-CM | POA: Insufficient documentation

## 2015-06-15 DIAGNOSIS — Y92410 Unspecified street and highway as the place of occurrence of the external cause: Secondary | ICD-10-CM | POA: Insufficient documentation

## 2015-06-15 DIAGNOSIS — S0990XA Unspecified injury of head, initial encounter: Secondary | ICD-10-CM | POA: Diagnosis present

## 2015-06-15 DIAGNOSIS — S0083XA Contusion of other part of head, initial encounter: Secondary | ICD-10-CM | POA: Insufficient documentation

## 2015-06-15 DIAGNOSIS — S0991XA Unspecified injury of ear, initial encounter: Secondary | ICD-10-CM | POA: Insufficient documentation

## 2015-06-15 MED ORDER — OXYCODONE-ACETAMINOPHEN 5-325 MG PO TABS: 1.0000 | ORAL_TABLET | ORAL | Status: DC | PRN

## 2015-06-15 MED ORDER — CYCLOBENZAPRINE HCL 10 MG PO TABS
5.0000 mg | ORAL_TABLET | Freq: Once | ORAL | Status: AC
  Administered 2015-06-15: 5 mg via ORAL
  Filled 2015-06-15: qty 1

## 2015-06-15 MED ORDER — OXYCODONE-ACETAMINOPHEN 5-325 MG PO TABS
ORAL_TABLET | ORAL | Status: AC
  Filled 2015-06-15: qty 1
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
## 2015-06-16 MED ORDER — NAPROXEN 500 MG PO TABS: 500.0000 mg | ORAL_TABLET | Freq: Two times a day (BID) | ORAL | Status: DC

## 2015-06-16 MED ORDER — CYCLOBENZAPRINE HCL 5 MG PO TABS: 5.0000 mg | ORAL_TABLET | Freq: Three times a day (TID) | ORAL | Status: DC | PRN

## 2015-06-16 NOTE — ED Notes (Addendum)
Patient and patient's friend both verbalized understanding of discharge instructions and deny any further needs or questions at this time. VS stable. Patient ambulatory with steady gait.

## 2015-06-16 NOTE — Discharge Instructions (Signed)
Your CT scan tonight is normal. Your shoulder x-ray shows no fracture or dislocation. If you shoulder pain continues follow up with Dr. Roda ShuttersXu.  Do not take the muscle relaxant if driving as it will make you sleepy.  Return here as needed.

## 2015-07-08 ENCOUNTER — Ambulatory Visit (INDEPENDENT_AMBULATORY_CARE_PROVIDER_SITE_OTHER): Payer: BC Managed Care – PPO | Admitting: Internal Medicine

## 2015-07-08 ENCOUNTER — Encounter: Payer: Self-pay | Admitting: Internal Medicine

## 2015-07-08 VITALS — BP 124/68 | HR 71 | Ht 64.0 in | Wt 226.2 lb

## 2015-07-08 DIAGNOSIS — G4733 Obstructive sleep apnea (adult) (pediatric): Secondary | ICD-10-CM

## 2015-07-08 DIAGNOSIS — J302 Other seasonal allergic rhinitis: Secondary | ICD-10-CM

## 2015-07-08 DIAGNOSIS — J3089 Other allergic rhinitis: Secondary | ICD-10-CM

## 2015-07-08 NOTE — Progress Notes (Signed)
04/03/2015-50 year old female never smoker referred courtesy of Dr Sanda Lingerhomas Jones; throat issues Intermittent episodes of sustained hoarseness and throat "congestion" especially Fall/Winter/Spring Loud snorer, always tired. Wakes herself snoring. Throat "issues" over the last 2 years or so described as throat congestion and hoarseness. Episodes come on most often in fall, winter or spring and tend to last a week or so at a time. She doesn't recognize a direct trigger but suggests sometimes of day, after sitting outside watching her son's soccer games. Perennial nasal drainage. Allergy skin testing many years ago remembered as positive for dust, trees and molds with no allergy vaccine. Doesn't like steroid nasal sprays "burn my nose". Aware that she is a loud snorer and has woken herself snoring. Always feels tired. Not told if she stops breathing. No history of asthma, heart or lung disease. Does notice occasional acid heartburn.  07/08/2015-50 year old female never smoker followed for congestion in throat/hoarseness, complicated by depression, dyspnea on exertion, allergic rhinitis NPSG 06/07/2015-primary snoring without OSA, AHI 1.1/ hr, desaturation to 86%, body weight 226 pounds FOLLOW FOR: Sleep Study Results.      ROS-see HPI   Negative unless "+" Constitutional:    weight loss, night sweats, fevers, chills, fatigue, lassitude. HEENT:    headaches, difficulty swallowing, tooth/dental problems, sore throat,       sneezing, itching, ear ache, sinus nasal congestion, post nasal drip, snoring CV:    chest pain, orthopnea, PND, swelling in lower extremities, anasarca,                                                      dizziness, palpitations Resp:   shortness of breath with exertion or at rest.                productive cough,   non-productive cough, coughing up of blood.              change in color of mucus.  wheezing.   Skin:    rash or lesions. GI:  No-   heartburn, indigestion,  abdominal pain, nausea, vomiting, diarrhea,                 change in bowel habits, loss of appetite GU: dysuria, change in color of urine, no urgency or frequency.   flank pain. MS:   joint pain, stiffness, decreased range of motion, back pain. Neuro-     nothing unusual Psych:  change in mood or affect.  depression or anxiety.   memory loss.  OBJ- Physical Exam General- Alert, Oriented, Affect-appropriate, Distress- none acute, + obese Skin- rash-none, lesions- none, excoriation- none Lymphadenopathy- none Head- atraumatic            Eyes- Gross vision intact, PERRLA, conjunctivae and secretions clear            Ears- Hearing, canals-normal            Nose- + mild turbinate edema, no-Septal dev, mucus, polyps, erosion, perforation             Throat- Mallampati IV , mucosa clear , drainage- none, tonsils- atrophic Neck- flexible , trachea midline, no stridor , thyroid nl, carotid no bruit Chest - symmetrical excursion , unlabored           Heart/CV- RRR , no murmur , no gallop  , no rub, nl s1  s2                           - JVD- none , edema- none, stasis changes- none, varices- none           Lung- clear to P&A, wheeze- none, cough- none , dullness-none, rub- none           Chest wall-  Abd-  Br/ Gen/ Rectal- Not done, not indicated Extrem- cyanosis- none, clubbing, none, atrophy- none, strength- nl Neuro- grossly intact to observation

## 2015-07-08 NOTE — Patient Instructions (Signed)
Ok to alternate among antihistamines as needed-  Zyrtec, claritin or allegra  Try again to see if a pharmacist could get you cromolyn nasal spray (nasalcrom/ cromolyn/ cromol)  Consider trying a mouth piece or chin strap for snoring- sold at drug  Stores  If you don't get better then consider ENT to look at your throat

## 2015-08-10 ENCOUNTER — Other Ambulatory Visit: Payer: Self-pay | Admitting: *Deleted

## 2015-08-10 MED ORDER — MIRTAZAPINE 15 MG PO TABS: 15.0000 mg | ORAL_TABLET | Freq: Every day | ORAL | Status: DC

## 2015-10-18 ENCOUNTER — Other Ambulatory Visit: Payer: Self-pay | Admitting: Internal Medicine

## 2015-10-18 DIAGNOSIS — G43009 Migraine without aura, not intractable, without status migrainosus: Secondary | ICD-10-CM

## 2015-12-08 LAB — HM MAMMOGRAPHY: HM Mammogram: NORMAL (ref 0–4)

## 2016-01-18 ENCOUNTER — Ambulatory Visit (INDEPENDENT_AMBULATORY_CARE_PROVIDER_SITE_OTHER): Payer: BC Managed Care – PPO | Admitting: Family Medicine

## 2016-01-18 ENCOUNTER — Encounter: Payer: Self-pay | Admitting: Family Medicine

## 2016-01-18 VITALS — BP 122/78 | HR 86 | Temp 97.5°F | Wt 232.4 lb

## 2016-01-18 DIAGNOSIS — L03119 Cellulitis of unspecified part of limb: Secondary | ICD-10-CM

## 2016-01-18 MED ORDER — CEPHALEXIN 500 MG PO CAPS: 500.0000 mg | ORAL_CAPSULE | Freq: Four times a day (QID) | ORAL | 0 refills | Status: DC

## 2016-01-18 NOTE — Progress Notes (Signed)
Subjective:    Patient ID: Melanie DredgeJennifer A [name], female    DOB: 09/07/1965, 50 y.o.   MRN: [id number]  HPI  [name] is a 50 year old female who presents today with right hand pain that started 2 weeks ago. Pain is rated as an 8 this morning but has improved to a 3 today. She reports that pain is noted in the morning and can also hurt in the evening but improves during the day.  Pain is described as sharp and is aggravated with making a fist. She also reports that pain is not improving over the past 2 weeks but is getting worse. Associated swelling of her hand after recent multiple scratches from her cat is noted. She denies fever, chills, sweats, N/V/D, chest pain, palpitations, SOB, numbness, weakness, tingling, or radiation of pain. No trigger for discomfort in hand other than recent cat scratches that are occurring at night while sleeping. She reports using a computer for her work.  Treatment with ibuprofen BID has provided moderate benefit.   Review of Systems  Constitutional: Negative for chills, fatigue and fever.  Respiratory: Negative for cough, shortness of breath and wheezing.   Cardiovascular: Negative for chest pain and palpitations.  Gastrointestinal: Negative for abdominal pain, diarrhea, nausea and vomiting.  Genitourinary: Negative for dysuria, hematuria and urgency.  Musculoskeletal:       Right hand pain  Skin: Negative for rash.  Neurological: Negative for dizziness, weakness and headaches.   Past Medical History:  Diagnosis Date  . Kidney stones   . Migraines      Social History   Social History  . Marital status: Legally Separated    Spouse name: N/A  . Number of children: N/A  . Years of education: N/A   Occupational History  . Not on file.   Social History Main Topics  . Smoking status: Never Smoker  . Smokeless tobacco: Never Used  . Alcohol use No  . Drug use: No  . Sexual activity: Not Currently    Birth control/ protection: IUD   Other Topics  Concern  . Not on file   Social History Narrative  . No narrative on file    Past Surgical History:  Procedure Laterality Date  . APPENDECTOMY    . laporoscopy    . LITHOTRIPSY    . WISDOM TOOTH EXTRACTION      Family History  Problem Relation Age of Onset  . Diabetes Mother   . Alcohol abuse Mother   . Diabetes Father   . Heart disease Father   . Alcohol abuse Father   . Cancer Neg Hx   . Stroke Neg Hx   . Hyperlipidemia Neg Hx   . Hypertension Neg Hx   . Kidney disease Neg Hx   . Arthritis Neg Hx     Allergies  Allergen Reactions  . Hydrocodone-Homatropine Itching and Rash  . Sulfa Antibiotics Nausea And Vomiting  . Sulfacetamide Sodium     Current Outpatient Prescriptions on File Prior to Visit  Medication Sig Dispense Refill  . cyclobenzaprine (FLEXERIL) 5 MG tablet Take 1 tablet (5 mg total) by mouth 3 (three) times daily as needed for muscle spasms. 30 tablet 0  . ibuprofen (ADVIL,MOTRIN) 200 MG tablet Take 200 mg by mouth every 6 (six) hours as needed.    . mirtazapine (REMERON) 15 MG tablet Take 1 tablet (15 mg total) by mouth at bedtime. 90 tablet 1  . naproxen (NAPROSYN) 500 MG tablet Take 1 tablet (  500 mg total) by mouth 2 (two) times daily. 30 tablet 0  . Riboflavin (B-2) 100 MG TABS Take 100 mg by mouth QID. 100 tablet 11  . topiramate (TOPAMAX) 100 MG tablet Take 1 tablet (100 mg total) by mouth 2 (two) times daily. 180 tablet 1  . TREXIMET 85-500 MG tablet take 1 tablet by mouth every 2 hours if needed for migraines 10 tablet 11   No current facility-administered medications on file prior to visit.     BP 122/78 (BP Location: Left Arm, Patient Position: Sitting, Cuff Size: Normal)   Pulse 86   Temp 97.5 F (36.4 C) (Oral)   Wt 232 lb 6.4 oz (105.4 kg)   SpO2 96%   BMI 39.89 kg/m       Objective:   Physical Exam  Constitutional: She is oriented to person, place, and time. She appears well-developed and well-nourished.  Eyes: Pupils are  equal, round, and reactive to light. No scleral icterus.  Neck: Neck supple.  Cardiovascular: Normal rate and regular rhythm.   Pulmonary/Chest: Effort normal and breath sounds normal. She has no wheezes. She has no rales.  Abdominal: Soft. Bowel sounds are normal. There is no tenderness.  Lymphadenopathy:    She has no cervical adenopathy.  Neurological: She is alert and oriented to person, place, and time. Coordination normal.  Skin: Skin is warm and dry. No rash noted.  Tenderness to palpation with edema and mild erythema over area on right hand where she was scratched by her cat. No evidence of fluctuance or drainage noted. Multiple cat scratches are noted on both hands.          Assessment & Plan:  1. Cellulitis of hand Suspect that multiple cat scratches are source of possible infection; no cat bite occurrence. Will treat with Keflex and close follow up. Advised follow up with Dr.Jones in 3 to 4 days for further evaluation or sooner if needed. Advised her to consider a plan to decrease scratching from her cat. - cephALEXin (KEFLEX) 500 MG capsule; Take 1 capsule (500 mg total) by mouth 4 (four) times daily.  Dispense: 28 capsule; Refill: 0  Melanie McJulia Ashantia Amaral, FNP-C

## 2016-01-18 NOTE — Patient Instructions (Signed)
Please take medication as directed and follow up with Dr. Yetta BarreJones for evaluation of symptoms in 3 to 4 days or sooner if needed.   Cellulitis, Adult Introduction Cellulitis is a skin infection. The infected area is usually red and sore. This condition occurs most often in the arms and lower legs. It is very important to get treated for this condition. Follow these instructions at home:  Take over-the-counter and prescription medicines only as told by your doctor.  If you were prescribed an antibiotic medicine, take it as told by your doctor. Do not stop taking the antibiotic even if you start to feel better.  Drink enough fluid to keep your pee (urine) clear or pale yellow.  Do not touch or rub the infected area.  Raise (elevate) the infected area above the level of your heart while you are sitting or lying down.  Place warm or cold wet cloths (warm or cold compresses) on the infected area. Do this as told by your doctor.  Keep all follow-up visits as told by your doctor. This is important. These visits let your doctor make sure your infection is not getting worse. Contact a doctor if:  You have a fever.  Your symptoms do not get better after 1-2 days of treatment.  Your bone or joint under the infected area starts to hurt after the skin has healed.  Your infection comes back. This can happen in the same area or another area.  You have a swollen bump in the infected area.  You have new symptoms.  You feel ill and also have muscle aches and pains. Get help right away if:  Your symptoms get worse.  You feel very sleepy.  You throw up (vomit) or have watery poop (diarrhea) for a long time.  There are red streaks coming from the infected area.  Your red area gets larger.  Your red area turns darker. This information is not intended to replace advice given to you by your health care provider. Make sure you discuss any questions you have with your health care  provider. Document Released: 07/13/2007 Document Revised: 07/02/2015 Document Reviewed: 12/03/2014  2017 Elsevier

## 2016-01-18 NOTE — Progress Notes (Signed)
Pre visit review using our clinic review tool, if applicable. No additional management support is needed unless otherwise documented below in the visit note. 

## 2016-11-07 ENCOUNTER — Ambulatory Visit (INDEPENDENT_AMBULATORY_CARE_PROVIDER_SITE_OTHER): Payer: BC Managed Care – PPO | Admitting: Internal Medicine

## 2016-11-07 ENCOUNTER — Encounter: Payer: Self-pay | Admitting: Internal Medicine

## 2016-11-07 VITALS — BP 116/82 | HR 77 | Temp 98.3°F | Ht 64.0 in | Wt 236.0 lb

## 2016-11-07 DIAGNOSIS — F322 Major depressive disorder, single episode, severe without psychotic features: Secondary | ICD-10-CM

## 2016-11-07 DIAGNOSIS — Z23 Encounter for immunization: Secondary | ICD-10-CM

## 2016-11-07 DIAGNOSIS — J302 Other seasonal allergic rhinitis: Secondary | ICD-10-CM | POA: Diagnosis not present

## 2016-11-07 DIAGNOSIS — J019 Acute sinusitis, unspecified: Secondary | ICD-10-CM

## 2016-11-07 DIAGNOSIS — G43001 Migraine without aura, not intractable, with status migrainosus: Secondary | ICD-10-CM | POA: Diagnosis not present

## 2016-11-07 DIAGNOSIS — G43009 Migraine without aura, not intractable, without status migrainosus: Secondary | ICD-10-CM

## 2016-11-07 DIAGNOSIS — J3089 Other allergic rhinitis: Secondary | ICD-10-CM | POA: Diagnosis not present

## 2016-11-07 MED ORDER — AZITHROMYCIN 250 MG PO TABS: ORAL_TABLET | ORAL | 0 refills | Status: DC

## 2016-11-07 MED ORDER — FLUCONAZOLE 150 MG PO TABS: 150.0000 mg | ORAL_TABLET | Freq: Once | ORAL | 0 refills | Status: AC

## 2016-11-07 MED ORDER — B-2 100 MG PO TABS: 100.0000 mg | ORAL_TABLET | Freq: Four times a day (QID) | ORAL | 11 refills | Status: DC

## 2016-11-07 MED ORDER — SUMATRIPTAN-NAPROXEN SODIUM 85-500 MG PO TABS: ORAL_TABLET | ORAL | 11 refills | Status: DC

## 2016-11-07 MED ORDER — MIRTAZAPINE 15 MG PO TABS: 15.0000 mg | ORAL_TABLET | Freq: Every day | ORAL | 1 refills | Status: DC

## 2016-11-07 NOTE — Assessment & Plan Note (Signed)
meds renewed

## 2016-11-07 NOTE — Patient Instructions (Signed)
You can use over-the-counter  "cold" medicines  such as "Tylenol cold" , "Advil cold",  "Mucinex" or" Mucinex D"  for cough and congestion.   Avoid decongestants if you have high blood pressure and use "Afrin" nasal spray for nasal congestion as directed. Use " Delsym" or" Robitussin" cough syrup varietis for cough.  You can use plain "Tylenol" or "Advil" for fever, chills and achyness. Use Halls or Ricola cough drops.   Please, make an appointment if you are not better or if you're worse.  

## 2016-11-07 NOTE — Assessment & Plan Note (Signed)
Zpac Diflucan if needed 
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
## 2016-11-07 NOTE — Assessment & Plan Note (Signed)
Try Claritin daily

## 2016-11-07 NOTE — Addendum Note (Signed)
Addended by: Scarlett Presto on: 11/07/2016 12:06 PM   Modules accepted: Orders

## 2016-11-07 NOTE — Assessment & Plan Note (Signed)
Treximet prn

## 2017-01-18 LAB — HM PAP SMEAR

## 2017-05-18 ENCOUNTER — Ambulatory Visit: Payer: BC Managed Care – PPO | Admitting: Internal Medicine

## 2018-01-01 ENCOUNTER — Other Ambulatory Visit: Payer: Self-pay | Admitting: Internal Medicine

## 2018-01-01 NOTE — Telephone Encounter (Signed)
Copied from CRM [id number]. Topic: Quick Communication - See Telephone Encounter >> Jan 01, 2018 10:59 AM Mare LoanBurton, Donna F wrote: Pt wanted to have a note in the system that [facility] will be calling to request a refill on mirtazaprine and she wanted to let the office know that she has an appt on 01/18/18 and will be out of meds before the appt   Best number [phone]

## 2018-01-02 MED ORDER — MIRTAZAPINE 15 MG PO TABS: 15.0000 mg | ORAL_TABLET | Freq: Every day | ORAL | 0 refills | Status: DC

## 2018-01-02 NOTE — Telephone Encounter (Signed)
30 day courtesy refill  

## 2018-01-18 ENCOUNTER — Other Ambulatory Visit (INDEPENDENT_AMBULATORY_CARE_PROVIDER_SITE_OTHER): Payer: BC Managed Care – PPO

## 2018-01-18 ENCOUNTER — Ambulatory Visit: Payer: BC Managed Care – PPO | Admitting: Internal Medicine

## 2018-01-18 ENCOUNTER — Encounter: Payer: Self-pay | Admitting: Internal Medicine

## 2018-01-18 VITALS — BP 134/82 | HR 86 | Temp 98.1°F | Resp 16 | Ht 64.0 in | Wt 239.8 lb

## 2018-01-18 DIAGNOSIS — Z6841 Body Mass Index (BMI) 40.0 and over, adult: Secondary | ICD-10-CM

## 2018-01-18 DIAGNOSIS — G43009 Migraine without aura, not intractable, without status migrainosus: Secondary | ICD-10-CM

## 2018-01-18 DIAGNOSIS — Z0000 Encounter for general adult medical examination without abnormal findings: Secondary | ICD-10-CM | POA: Diagnosis not present

## 2018-01-18 DIAGNOSIS — I10 Essential (primary) hypertension: Secondary | ICD-10-CM

## 2018-01-18 DIAGNOSIS — F322 Major depressive disorder, single episode, severe without psychotic features: Secondary | ICD-10-CM

## 2018-01-18 DIAGNOSIS — Z23 Encounter for immunization: Secondary | ICD-10-CM | POA: Diagnosis not present

## 2018-01-18 DIAGNOSIS — Z1211 Encounter for screening for malignant neoplasm of colon: Secondary | ICD-10-CM

## 2018-01-18 LAB — COMPREHENSIVE METABOLIC PANEL
ALT: 12 U/L (ref 0–35)
AST: 12 U/L (ref 0–37)
Albumin: 3.9 g/dL (ref 3.5–5.2)
Alkaline Phosphatase: 86 U/L (ref 39–117)
BUN: 13 mg/dL (ref 6–23)
CHLORIDE: 107 meq/L (ref 96–112)
CO2: 27 mEq/L (ref 19–32)
CREATININE: 0.86 mg/dL (ref 0.40–1.20)
Calcium: 9.2 mg/dL (ref 8.4–10.5)
GFR: 74.1 mL/min (ref >60.00)
Glucose, Bld: 113 mg/dL — ABNORMAL HIGH (ref 70–99)
Potassium: 4.1 mEq/L (ref 3.5–5.1)
Sodium: 142 mEq/L (ref 135–145)
Total Bilirubin: 0.4 mg/dL (ref 0.2–1.2)
Total Protein: 6.5 g/dL (ref 6.0–8.3)

## 2018-01-18 LAB — URINALYSIS, ROUTINE W REFLEX MICROSCOPIC
Bilirubin Urine: NEGATIVE
NITRITE: NEGATIVE
PH: 6.5 (ref 5.0–8.0)
Specific Gravity, Urine: >=1.03 — AB (ref 1.000–1.030)
TOTAL PROTEIN, URINE-UPE24: 100 — AB
UROBILINOGEN UA: 0.2 (ref 0.0–1.0)
Urine Glucose: NEGATIVE

## 2018-01-18 LAB — LIPID PANEL
Cholesterol: 183 mg/dL (ref 0–200)
HDL: 42.9 mg/dL (ref >39.00)
NonHDL: 139.94
Total CHOL/HDL Ratio: 4
Triglycerides: 313 mg/dL — ABNORMAL HIGH (ref 0.0–149.0)
VLDL: 62.6 mg/dL — AB (ref 0.0–40.0)

## 2018-01-18 LAB — CBC WITH DIFFERENTIAL/PLATELET
BASOS PCT: 1.2 % (ref 0.0–3.0)
Basophils Absolute: 0.1 10*3/uL (ref 0.0–0.1)
Eosinophils Absolute: 0.2 10*3/uL (ref 0.0–0.7)
Eosinophils Relative: 1.6 % (ref 0.0–5.0)
HCT: 42 % (ref 36.0–46.0)
Hemoglobin: 14.2 g/dL (ref 12.0–15.0)
LYMPHS ABS: 2.3 10*3/uL (ref 0.7–4.0)
Lymphocytes Relative: 20.7 % (ref 12.0–46.0)
MCHC: 33.8 g/dL (ref 30.0–36.0)
MCV: 86.2 fl (ref 78.0–100.0)
MONOS PCT: 5.9 % (ref 3.0–12.0)
Monocytes Absolute: 0.7 10*3/uL (ref 0.1–1.0)
Neutro Abs: 7.9 10*3/uL — ABNORMAL HIGH (ref 1.4–7.7)
Neutrophils Relative %: 70.6 % (ref 43.0–77.0)
PLATELETS: 278 10*3/uL (ref 150.0–400.0)
RBC: 4.87 Mil/uL (ref 3.87–5.11)
RDW: 13.3 % (ref 11.5–15.5)
WBC: 11.1 10*3/uL — ABNORMAL HIGH (ref 4.0–10.5)

## 2018-01-18 LAB — LDL CHOLESTEROL, DIRECT: Direct LDL: 131 mg/dL

## 2018-01-18 LAB — TSH: TSH: 4.16 u[IU]/mL (ref 0.35–4.50)

## 2018-01-18 LAB — HEMOGLOBIN A1C: HEMOGLOBIN A1C: 5.2 % (ref 4.6–6.5)

## 2018-01-18 LAB — HM MAMMOGRAPHY: HM MAMMO: NORMAL (ref 0–4)

## 2018-01-18 MED ORDER — MIRTAZAPINE 15 MG PO TABS: 15.0000 mg | ORAL_TABLET | Freq: Every day | ORAL | 1 refills | Status: DC

## 2018-01-18 MED ORDER — LIRAGLUTIDE -WEIGHT MANAGEMENT 18 MG/3ML ~~LOC~~ SOPN: 3.0000 mg | PEN_INJECTOR | Freq: Every day | SUBCUTANEOUS | 5 refills | Status: DC

## 2018-01-18 MED ORDER — B-2 100 MG PO TABS: 400.0000 mg | ORAL_TABLET | Freq: Four times a day (QID) | ORAL | 5 refills | Status: DC

## 2018-01-18 MED ORDER — SUMATRIPTAN-NAPROXEN SODIUM 85-500 MG PO TABS: ORAL_TABLET | ORAL | 5 refills | Status: DC
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
## 2018-01-18 NOTE — Patient Instructions (Signed)

## 2018-01-22 ENCOUNTER — Telehealth: Payer: Self-pay | Admitting: Internal Medicine

## 2018-01-22 ENCOUNTER — Ambulatory Visit: Payer: Self-pay | Admitting: *Deleted

## 2018-01-22 NOTE — Telephone Encounter (Signed)
Copied from CRM [id number]. Topic: Quick Communication - See Telephone Encounter >> Jan 22, 2018  3:14 PM Lorrine KinMcGee, An Lannan B, [place] wrote: CRM for notification. See Telephone encounter for: 01/22/18. Patient calling and states that she is needing a PA on Liraglutide -Weight Management (SAXENDA) 18 MG/3ML SOPN. Please advise. CB#: [phone]

## 2018-01-22 NOTE — Telephone Encounter (Signed)
Pt received an injection for shingles and a DTap at the office,. Her right arm received the DTap and it is tender at the injection site. It was red, now pink. Denies fever.  The left arm received the shingles vaccine. She said that after the injection she did feel tenderness in the lymph nodes in the under arm of that arm. But none now. Can use both arms without difficulty. Per protocol advised to put ice to the right arm for tenderness and to the small lump there.  Can take Tylenol for any discomfort. Recommend to try this for over night and if not better or a fever, call back in the morning for an appointment.  Pt voiced understanding. [facility] [address]  Reason for Disposition . Tetanus-diphtheria (Td) vaccine reactions  Answer Assessment - Initial Assessment Questions 1. SYMPTOMS: "What is the main symptom?" (e.g., redness, swelling, pain)      Pink, lump there and warm to touch 2. ONSET: "When was the vaccine (shot) given?" "How much later did the redness about 12 hours__ begin?" (e.g., hours, days ago)      Thursday 01/11/18 3. SEVERITY: "How bad is it?"      uncomfortable 4. FEVER: "Is there a fever?" If so, ask: "What is it, how was it measured, and when did it start?"      no 5. IMMUNIZATIONS GIVEN: "What shots have you recently received?"     Flu shot in October 6. PAST REACTIONS: "Have you reacted to immunizations before?" If so, ask: "What happened?"    Just warm and tender at the site of injections 7. OTHER SYMPTOMS: "Do you have any other symptoms?"     no  Protocols used: IMMUNIZATION REACTIONS-A-AH

## 2018-01-23 NOTE — Telephone Encounter (Signed)
Key: [id number]

## 2018-01-23 NOTE — Telephone Encounter (Signed)
Patient is feeling much better in both arms today, still a little swelling and a little tenderness---patient advised this is considered a normal side effect for these vaccines, call us back if swelling/pain worsens

## 2018-01-29 NOTE — Telephone Encounter (Signed)
Pt checking status on the PA for Saxenda.

## 2018-01-29 NOTE — Telephone Encounter (Addendum)
There has not been a response from [employer] insurance company. I will resend to plan for determination.   PA has been approved. Pt informed via mychart.

## 2018-01-30 ENCOUNTER — Encounter: Payer: Self-pay | Admitting: Family

## 2018-01-30 ENCOUNTER — Ambulatory Visit: Payer: BC Managed Care – PPO | Admitting: Family

## 2018-01-30 VITALS — BP 126/76 | HR 101 | Temp 98.4°F | Ht 64.0 in | Wt 229.1 lb

## 2018-01-30 DIAGNOSIS — J019 Acute sinusitis, unspecified: Secondary | ICD-10-CM | POA: Diagnosis not present

## 2018-01-30 DIAGNOSIS — R7989 Other specified abnormal findings of blood chemistry: Secondary | ICD-10-CM | POA: Diagnosis not present

## 2018-01-30 MED ORDER — BENZONATATE 100 MG PO CAPS: 100.0000 mg | ORAL_CAPSULE | Freq: Three times a day (TID) | ORAL | 0 refills | Status: DC | PRN

## 2018-01-30 MED ORDER — CEFDINIR 300 MG PO CAPS: 300.0000 mg | ORAL_CAPSULE | Freq: Two times a day (BID) | ORAL | 0 refills | Status: DC

## 2018-01-30 NOTE — Progress Notes (Signed)
Melanie DredgeJennifer A [name] is a 52 y.o. female with the following history as recorded in EpicCare:  Patient Active Problem List   Diagnosis Date Noted  . Morbid obesity with BMI of 40.0-44.9, adult (HCC) 01/18/2018  . Obstructive sleep apnea 04/03/2015  . Routine adult health maintenance 11/13/2013  . Encounter for screening mammogram for breast cancer 11/13/2013  . Migraine headache 05/14/2012  . Severe major depression (HCC) 09/05/2006  . Seasonal and perennial allergic rhinitis 09/05/2006    Current Outpatient Medications  Medication Sig Dispense Refill  . Liraglutide -Weight Management (SAXENDA) 18 MG/3ML SOPN Inject 3 mg into the skin daily. 5 pen 5  . mirtazapine (REMERON) 15 MG tablet Take 1 tablet (15 mg total) by mouth at bedtime. 90 tablet 1  . Riboflavin (B-2) 100 MG TABS Take 400 mg by mouth QID. 120 tablet 5  . SUMAtriptan-naproxen (TREXIMET) 85-500 MG tablet take 1 tablet by mouth every 2 hours if needed for migraines 10 tablet 5  . benzonatate (TESSALON) 100 MG capsule Take 1 capsule (100 mg total) by mouth 3 (three) times daily as needed. 20 capsule 0  . cefdinir (OMNICEF) 300 MG capsule Take 1 capsule (300 mg total) by mouth 2 (two) times daily. 20 capsule 0   No current facility-administered medications for this visit.     Allergies: Hydrocodone-homatropine; Sulfa antibiotics; and Sulfacetamide sodium  Past Medical History:  Diagnosis Date  . Kidney stones   . Migraines     Past Surgical History:  Procedure Laterality Date  . APPENDECTOMY    . laporoscopy    . LITHOTRIPSY    . WISDOM TOOTH EXTRACTION      Family History  Problem Relation Age of Onset  . Diabetes Mother   . Alcohol abuse Mother   . Diabetes Father   . Heart disease Father   . Alcohol abuse Father   . Cancer Neg Hx   . Stroke Neg Hx   . Hyperlipidemia Neg Hx   . Hypertension Neg Hx   . Kidney disease Neg Hx   . Arthritis Neg Hx     Social History   Tobacco Use  . Smoking status: Never Smoker   . Smokeless tobacco: Never Used  Substance Use Topics  . Alcohol use: No    Subjective:  Patient presents with cough/ congestion x 2 weeks; "just can't shake it." no fever, no chest pain, no shortness of breath; not taking any OTC medications because she was concerned about interactions with Saxenda;  Also mentions concerns that her TSH "has been creeping up" over the past few years; notes that mother, father and sister all have thyroid disease; concerned that she might need medication. Has not addressed concerns with her PCP;     Objective:  Vitals:   01/30/18 0954  BP: 126/76  Pulse: (!) 101  Temp: 98.4 F (36.9 C)  TempSrc: Oral  SpO2: 96%  Weight: 229 lb 1.9 oz (103.9 kg)  Height: 5\' 4"  (1.626 m)    General: Well developed, well nourished, in no acute distress  Skin : Warm and dry.  Head: Normocephalic and atraumatic  Eyes: Sclera and conjunctiva clear; pupils round and reactive to light; extraocular movements intact  Ears: External normal; canals clear; tympanic membranes normal  Oropharynx: Pink, supple. No suspicious lesions  Neck: Supple without thyromegaly, adenopathy  Lungs: Respirations unlabored; clear to auscultation bilaterally without wheeze, rales, rhonchi  CVS exam: normal rate and regular rhythm.  Neurologic: Alert and oriented; speech intact; face symmetrical;  moves all extremities well; CNII-XII intact without focal deficit   Assessment:  1. Acute sinusitis, recurrence not specified, unspecified location   2. Elevated TSH     Plan:  1. Rx for Omnicef 300 mg bid x 10 days; Rx for Tessalon Perles 100 mg tid prn; increase fluids, rest and followup worse, no better. 2. Recommend that she follow-up with her PCP in 3 month to re-check TSH/ update thyroid profile; may benefit from low dose Synthroid; can also follow-up on Saxenda response at the same time.   Return in about 3 months (around 05/01/2018) for Dr. Cecille PoJones/ Saxenda follow-up/ discuss thyroid.  No  orders of the defined types were placed in this encounter.   Requested Prescriptions   Signed Prescriptions Disp Refills  . cefdinir (OMNICEF) 300 MG capsule 20 capsule 0    Sig: Take 1 capsule (300 mg total) by mouth 2 (two) times daily.  . benzonatate (TESSALON) 100 MG capsule 20 capsule 0    Sig: Take 1 capsule (100 mg total) by mouth 3 (three) times daily as needed.

## 2018-02-26 LAB — HM PAP SMEAR: HM Pap smear: NEGATIVE

## 2018-04-23 ENCOUNTER — Encounter: Payer: Self-pay | Admitting: Family

## 2018-04-23 ENCOUNTER — Other Ambulatory Visit: Payer: Self-pay

## 2018-04-23 ENCOUNTER — Ambulatory Visit: Payer: BC Managed Care – PPO | Admitting: Family

## 2018-04-23 VITALS — BP 120/78 | HR 85 | Temp 98.1°F | Ht 64.0 in | Wt 219.0 lb

## 2018-04-23 DIAGNOSIS — J019 Acute sinusitis, unspecified: Secondary | ICD-10-CM

## 2018-04-23 MED ORDER — BENZONATATE 100 MG PO CAPS: 100.0000 mg | ORAL_CAPSULE | Freq: Three times a day (TID) | ORAL | 0 refills | Status: DC | PRN

## 2018-04-23 MED ORDER — CEFDINIR 300 MG PO CAPS: 300.0000 mg | ORAL_CAPSULE | Freq: Two times a day (BID) | ORAL | 0 refills | Status: DC

## 2018-04-23 NOTE — Progress Notes (Signed)
[name] is a 53 y.o. female with the following history as recorded in EpicCare:  Patient Active Problem List   Diagnosis Date Noted  . Morbid obesity with BMI of 40.0-44.9, adult (HCC) 01/18/2018  . Obstructive sleep apnea 04/03/2015  . Routine adult health maintenance 11/13/2013  . Encounter for screening mammogram for breast cancer 11/13/2013  . Migraine headache 05/14/2012  . Severe major depression (HCC) 09/05/2006  . Seasonal and perennial allergic rhinitis 09/05/2006    Current Outpatient Medications  Medication Sig Dispense Refill  . Liraglutide -Weight Management (SAXENDA) 18 MG/3ML SOPN Inject 3 mg into the skin daily. 5 pen 5  . mirtazapine (REMERON) 15 MG tablet Take 1 tablet (15 mg total) by mouth at bedtime. 90 tablet 1  . Riboflavin (B-2) 100 MG TABS Take 400 mg by mouth QID. 120 tablet 5  . SUMAtriptan-naproxen (TREXIMET) 85-500 MG tablet take 1 tablet by mouth every 2 hours if needed for migraines 10 tablet 5  . benzonatate (TESSALON) 100 MG capsule Take 1 capsule (100 mg total) by mouth 3 (three) times daily as needed. 30 capsule 0  . cefdinir (OMNICEF) 300 MG capsule Take 1 capsule (300 mg total) by mouth 2 (two) times daily. 20 capsule 0   No current facility-administered medications for this visit.     Allergies: Hydrocodone-homatropine; Sulfa antibiotics; and Sulfacetamide sodium  Past Medical History:  Diagnosis Date  . Kidney stones   . Migraines     Past Surgical History:  Procedure Laterality Date  . APPENDECTOMY    . laporoscopy    . LITHOTRIPSY    . WISDOM TOOTH EXTRACTION      Family History  Problem Relation Age of Onset  . Diabetes Mother   . Alcohol abuse Mother   . Diabetes Father   . Heart disease Father   . Alcohol abuse Father   . Cancer Neg Hx   . Stroke Neg Hx   . Hyperlipidemia Neg Hx   . Hypertension Neg Hx   . Kidney disease Neg Hx   . Arthritis Neg Hx     Social History   Tobacco Use  . Smoking status: Never Smoker   . Smokeless tobacco: Never Used  Substance Use Topics  . Alcohol use: No    Subjective:  Patient presents with concerns for sinus infection; x 2 weeks; + sinus pain/ pressure; no fever; prone to recurrent sinus infection- spring allergies; using OTC Daytime/ nighttime allergy medication; limited relief with Xyzal; does not like nasal sprays;   Objective:  Vitals:   04/23/18 1335  BP: 120/78  Pulse: 85  Temp: 98.1 F (36.7 C)  TempSrc: Oral  SpO2: 97%  Weight: 219 lb (99.3 kg)  Height: 5\' 4"  (1.626 m)    General: Well developed, well nourished, in no acute distress  Skin : Warm and dry.  Head: Normocephalic and atraumatic  Eyes: Sclera and conjunctiva clear; pupils round and reactive to light; extraocular movements intact  Ears: External normal; canals clear; tympanic membranes normal  Oropharynx: Pink, supple. No suspicious lesions  Neck: Supple without thyromegaly, adenopathy  Lungs: Respirations unlabored; clear to auscultation bilaterally without wheeze, rales, rhonchi  CVS exam: normal rate and regular rhythm.  Neurologic: Alert and oriented; speech intact; face symmetrical; moves all extremities well; CNII-XII intact without focal deficit   Assessment:  1. Acute sinusitis, recurrence not specified, unspecified location     Plan:  Rx for Omnicef 300 mg bid x 10 days, Tessalon Perles; continue OTC  allergy medications; increase fluids, rest and follow-up worse, no better.    No follow-ups on file.  No orders of the defined types were placed in this encounter.   Requested Prescriptions   Signed Prescriptions Disp Refills  . benzonatate (TESSALON) 100 MG capsule 30 capsule 0    Sig: Take 1 capsule (100 mg total) by mouth 3 (three) times daily as needed.  . cefdinir (OMNICEF) 300 MG capsule 20 capsule 0    Sig: Take 1 capsule (300 mg total) by mouth 2 (two) times daily.

## 2018-04-30 ENCOUNTER — Ambulatory Visit: Payer: BC Managed Care – PPO | Admitting: Internal Medicine

## 2018-05-21 ENCOUNTER — Ambulatory Visit: Payer: BC Managed Care – PPO

## 2018-05-29 ENCOUNTER — Ambulatory Visit (INDEPENDENT_AMBULATORY_CARE_PROVIDER_SITE_OTHER): Payer: BC Managed Care – PPO | Admitting: Internal Medicine

## 2018-05-29 ENCOUNTER — Encounter: Payer: Self-pay | Admitting: Internal Medicine

## 2018-05-29 DIAGNOSIS — E034 Atrophy of thyroid (acquired): Secondary | ICD-10-CM | POA: Diagnosis not present

## 2018-05-29 DIAGNOSIS — Z6841 Body Mass Index (BMI) 40.0 and over, adult: Secondary | ICD-10-CM

## 2018-05-29 MED ORDER — LIRAGLUTIDE -WEIGHT MANAGEMENT 18 MG/3ML ~~LOC~~ SOPN: 3.0000 mg | PEN_INJECTOR | Freq: Every day | SUBCUTANEOUS | 5 refills | Status: DC

## 2018-05-29 MED ORDER — INSULIN PEN NEEDLE 32G X 6 MM MISC: 1.0000 | Freq: Every day | 1 refills | Status: DC

## 2018-05-29 MED ORDER — LEVOTHYROXINE SODIUM 50 MCG PO TABS: 50.0000 ug | ORAL_TABLET | Freq: Every day | ORAL | 0 refills | Status: DC

## 2018-05-29 NOTE — Progress Notes (Signed)
Virtual Visit via Video Note  I connected with [name] on 05/29/18 at 10:00 AM EDT by a video enabled telemedicine application and verified that I am speaking with the correct person using two identifiers.   I discussed the limitations of evaluation and management by telemedicine and the availability of in person appointments. The patient expressed understanding and agreed to proceed.  History of Present Illness: She checked in for virtual visit.  She was not willing to come in for an in person visit due to the coronavirus pandemic.  Since I last saw her she has been able lose 25 pounds with the use of Saxenda.  She continues to complain of difficulty losing weight and fatigue.  She tells me the fatigue is improved with exercise.  She denies sleep disturbance, abnormal bowel movements, palpitations, edema, jitteriness, anxiety, or anhedonia.    Observations/Objective: Obese.  No acute distress.  Speech and language normal.  Calm, cooperative, appropriate.   Lab Results  Component Value Date   WBC 11.1 (H) 01/18/2018   HGB 14.2 01/18/2018   HCT 42.0 01/18/2018   PLT 278.0 01/18/2018   GLUCOSE 113 (H) 01/18/2018   CHOL 183 01/18/2018   TRIG 313.0 (H) 01/18/2018   HDL 42.90 01/18/2018   LDLDIRECT 131.0 01/18/2018   LDLCALC 89 01/13/2012   ALT 12 01/18/2018   AST 12 01/18/2018   NA 142 01/18/2018   K 4.1 01/18/2018   CL 107 01/18/2018   CREATININE 0.86 01/18/2018   BUN 13 01/18/2018   CO2 27 01/18/2018   TSH 4.16 01/18/2018   HGBA1C 5.2 01/18/2018    Assessment and Plan: She has a family history of Hashimoto's thyroiditis and her recent TSH had crept up to 4.16.  She also complains of fatigue and difficulty losing weight.  I am concerned she has developed symptomatic hypothyroidism so have asked her to start taking levothyroxine.  She will let me know her response to this and she will return in 2 to 3 months to recheck her TSH level.  The meantime she will continue using  Saxenda and will continue to improve her lifestyle modifications.   Follow Up Instructions: I encouraged her to continue to push through the fatigue and to try to exercise as much as possible.  She agrees to continue using Saxenda and to start the levothyroxine.  She will let me know of any side effects and she will let me know if she develops any new or worsening symptoms.    I discussed the assessment and treatment plan with the patient. The patient was provided an opportunity to ask questions and all were answered. The patient agreed with the plan and demonstrated an understanding of the instructions.   The patient was advised to call back or seek an in-person evaluation if the symptoms worsen or if the condition fails to improve as anticipated.  I provided 25 minutes of non-face-to-face time during this encounter.   Sanda Linger, MD

## 2018-07-09 HISTORY — PX: COLONOSCOPY: SHX174

## 2018-07-26 LAB — HM COLONOSCOPY

## 2018-08-29 ENCOUNTER — Encounter: Payer: Self-pay | Admitting: Internal Medicine

## 2018-08-29 ENCOUNTER — Ambulatory Visit (INDEPENDENT_AMBULATORY_CARE_PROVIDER_SITE_OTHER): Payer: BC Managed Care – PPO | Admitting: Internal Medicine

## 2018-08-29 ENCOUNTER — Other Ambulatory Visit: Payer: Self-pay

## 2018-08-29 ENCOUNTER — Other Ambulatory Visit (INDEPENDENT_AMBULATORY_CARE_PROVIDER_SITE_OTHER): Payer: BC Managed Care – PPO

## 2018-08-29 ENCOUNTER — Ambulatory Visit (INDEPENDENT_AMBULATORY_CARE_PROVIDER_SITE_OTHER)
Admission: RE | Admit: 2018-08-29 | Discharge: 2018-08-29 | Disposition: A | Discharge status: Home / Self Care | Payer: BC Managed Care – PPO | Source: Ambulatory Visit | Attending: Internal Medicine | Admitting: Internal Medicine

## 2018-08-29 VITALS — BP 116/86 | HR 73 | Temp 97.8°F | Resp 15 | Ht 64.0 in | Wt 213.0 lb

## 2018-08-29 DIAGNOSIS — E034 Atrophy of thyroid (acquired): Secondary | ICD-10-CM

## 2018-08-29 DIAGNOSIS — E781 Pure hyperglyceridemia: Secondary | ICD-10-CM

## 2018-08-29 DIAGNOSIS — R739 Hyperglycemia, unspecified: Secondary | ICD-10-CM | POA: Insufficient documentation

## 2018-08-29 DIAGNOSIS — M25522 Pain in left elbow: Secondary | ICD-10-CM

## 2018-08-29 DIAGNOSIS — G8929 Other chronic pain: Secondary | ICD-10-CM

## 2018-08-29 DIAGNOSIS — M7712 Lateral epicondylitis, left elbow: Secondary | ICD-10-CM

## 2018-08-29 LAB — BASIC METABOLIC PANEL
BUN: 15 mg/dL (ref 6–23)
CO2: 27 mEq/L (ref 19–32)
Calcium: 9.8 mg/dL (ref 8.4–10.5)
Chloride: 106 mEq/L (ref 96–112)
Creatinine, Ser: 0.78 mg/dL (ref 0.40–1.20)
GFR: 77.84 mL/min (ref >60.00)
Glucose, Bld: 90 mg/dL (ref 70–99)
Potassium: 4 mEq/L (ref 3.5–5.1)
Sodium: 141 mEq/L (ref 135–145)

## 2018-08-29 LAB — TRIGLYCERIDES: Triglycerides: 113 mg/dL (ref 0.0–149.0)

## 2018-08-29 LAB — HEMOGLOBIN A1C: Hgb A1c MFr Bld: 5.2 % (ref 4.6–6.5)

## 2018-08-29 LAB — TSH: TSH: 2.08 u[IU]/mL (ref 0.35–4.50)

## 2018-08-29 MED ORDER — MELOXICAM 15 MG PO TABS: 15.0000 mg | ORAL_TABLET | Freq: Every day | ORAL | 0 refills | Status: DC
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
## 2018-08-29 NOTE — Patient Instructions (Signed)
Tennis Elbow  Tennis elbow (lateral epicondylitis) is inflammation of tendons in your outer forearm, near your elbow. Tendons are tissues that connect muscle to bone. When you have tennis elbow, inflammation affects the tendons that you use to bend your wrist and move your hand up. Inflammation occurs in the lower part of the upper arm bone (humerus), where the tendons connect to the bone (lateral epicondyle). Tennis elbow often affects people who play tennis, but anyone may get the condition from repeatedly extending the wrist or turning the forearm. What are the causes? This condition is usually caused by repeatedly extending the wrist, turning the forearm, and using the hands. It can result from sports or work that requires repetitive forearm movements. In some cases, it may be caused by a sudden injury. What increases the risk? You are more likely to develop tennis elbow if you play tennis or another racket sport. You also have a higher risk if you frequently use your hands for work. Besides people who play tennis, others at greater risk include:  Musicians.  Carpenters, painters, and plumbers.  Cooks.  Cashiers.  People who work in factories.  Construction workers.  Butchers.  People who use computers. What are the signs or symptoms? Symptoms of this condition include:  Pain and tenderness in the forearm and the outer part of the elbow. Pain may be felt only when using the arm, or it may be there all the time.  A burning feeling that starts in the elbow and spreads down the forearm.  A weak grip in the hand. How is this diagnosed? This condition may be diagnosed based on:  Your symptoms and medical history.  A physical exam.  X-rays.  MRI. How is this treated? Resting and icing your arm is often the first treatment. Your health care provider may also recommend:  Medicines to reduce pain and inflammation. These may be in the form of a pill, topical gels, or shots of a  steroid medicine (cortisone).  An elbow strap to reduce stress on the area.  Physical therapy. This may include massage or exercises.  An elbow brace to restrict the movements that cause symptoms. If these treatments do not help relieve your symptoms, your health care provider may recommend surgery to remove damaged muscle and reattach healthy muscle to bone. Follow these instructions at home: Activity  Rest your elbow and wrist and avoid activities that cause symptoms, as told by your health care provider.  Do physical therapy exercises as instructed.  If you lift an object, lift it with your palm facing up. This reduces stress on your elbow. Lifestyle  If your tennis elbow is caused by sports, check your equipment and make sure that: ? You are using it correctly. ? It is the best fit for you.  If your tennis elbow is caused by work or computer use, take frequent breaks to stretch your arm. Talk with your manager about ways to manage your condition at work. If you have a brace:  Wear the brace or strap as told by your health care provider. Remove it only as told by your health care provider.  Loosen the brace if your fingers tingle, become numb, or turn cold and blue.  Keep the brace clean.  If the brace is not waterproof, ask if you may remove it for bathing. If you must keep the brace on while bathing: ? Do not let it get wet. ? Cover it with a watertight covering when you take a   bath or a shower. General instructions   If directed, put ice on the painful area: ? Put ice in a plastic bag. ? Place a towel between your skin and the bag. ? Leave the ice on for 20 minutes, 2-3 times a day.  Take over-the-counter and prescription medicines only as told by your health care provider.  Keep all follow-up visits as told by your health care provider. This is important. Contact a health care provider if:  You have pain that gets worse or does not get better with treatment.   You have numbness or weakness in your forearm, hand, or fingers. Summary  Tennis elbow (lateral epicondylitis) is inflammation of tendons in your outer forearm, near your elbow.  Common symptoms include pain and tenderness in your forearm and the outer part of your elbow.  This condition is usually caused by repeatedly extending your wrist, turning your forearm, and using your hands.  The first treatment is often resting and icing your arm to relieve symptoms. Further treatment may include taking medicine, getting physical therapy, wearing a brace or strap, or having surgery. This information is not intended to replace advice given to you by your health care provider. Make sure you discuss any questions you have with your health care provider. Document Released: 01/24/2005 Document Revised: 10/20/2017 Document Reviewed: 11/08/2016 Elsevier Patient Education  2020 [employer]  

## 2018-08-30 MED ORDER — LEVOTHYROXINE SODIUM 50 MCG PO TABS: 50.0000 ug | ORAL_TABLET | Freq: Every day | ORAL | 1 refills | Status: DC

## 2018-09-11 ENCOUNTER — Other Ambulatory Visit: Payer: Self-pay | Admitting: Internal Medicine

## 2018-09-11 DIAGNOSIS — E034 Atrophy of thyroid (acquired): Secondary | ICD-10-CM

## 2018-09-11 MED ORDER — LEVOTHYROXINE SODIUM 50 MCG PO TABS: 50.0000 ug | ORAL_TABLET | Freq: Every day | ORAL | 1 refills | Status: DC

## 2018-09-24 ENCOUNTER — Other Ambulatory Visit: Payer: Self-pay | Admitting: Internal Medicine

## 2018-09-24 DIAGNOSIS — F322 Major depressive disorder, single episode, severe without psychotic features: Secondary | ICD-10-CM

## 2018-09-24 MED ORDER — MIRTAZAPINE 15 MG PO TABS: 15.0000 mg | ORAL_TABLET | Freq: Every day | ORAL | 1 refills | Status: DC

## 2018-10-02 NOTE — Telephone Encounter (Signed)
Pt stated the pharmacy told her she needs a PA for Saxenda. Please advise.

## 2018-10-05 ENCOUNTER — Telehealth: Payer: Self-pay

## 2018-10-05 NOTE — Telephone Encounter (Signed)
Key: [id number]    

## 2018-11-01 ENCOUNTER — Other Ambulatory Visit: Payer: Self-pay

## 2018-11-01 ENCOUNTER — Other Ambulatory Visit (INDEPENDENT_AMBULATORY_CARE_PROVIDER_SITE_OTHER): Payer: BC Managed Care – PPO

## 2018-11-01 ENCOUNTER — Encounter: Payer: Self-pay | Admitting: Internal Medicine

## 2018-11-01 ENCOUNTER — Ambulatory Visit (INDEPENDENT_AMBULATORY_CARE_PROVIDER_SITE_OTHER): Payer: BC Managed Care – PPO | Admitting: Internal Medicine

## 2018-11-01 ENCOUNTER — Ambulatory Visit (INDEPENDENT_AMBULATORY_CARE_PROVIDER_SITE_OTHER)
Admission: RE | Admit: 2018-11-01 | Discharge: 2018-11-01 | Disposition: A | Discharge status: Home / Self Care | Payer: BC Managed Care – PPO | Source: Ambulatory Visit | Attending: Internal Medicine | Admitting: Internal Medicine

## 2018-11-01 VITALS — BP 122/88 | HR 89 | Temp 98.2°F | Ht 64.0 in | Wt 215.0 lb

## 2018-11-01 DIAGNOSIS — R3121 Asymptomatic microscopic hematuria: Secondary | ICD-10-CM | POA: Diagnosis not present

## 2018-11-01 DIAGNOSIS — M545 Low back pain, unspecified: Secondary | ICD-10-CM

## 2018-11-01 DIAGNOSIS — R311 Benign essential microscopic hematuria: Secondary | ICD-10-CM | POA: Diagnosis not present

## 2018-11-01 DIAGNOSIS — G8929 Other chronic pain: Secondary | ICD-10-CM

## 2018-11-01 DIAGNOSIS — Z23 Encounter for immunization: Secondary | ICD-10-CM | POA: Diagnosis not present

## 2018-11-01 LAB — CBC WITH DIFFERENTIAL/PLATELET
Basophils Absolute: 0.1 10*3/uL (ref 0.0–0.1)
Basophils Relative: 1.2 % (ref 0.0–3.0)
Eosinophils Absolute: 0.2 10*3/uL (ref 0.0–0.7)
Eosinophils Relative: 1.6 % (ref 0.0–5.0)
HCT: 42.5 % (ref 36.0–46.0)
Hemoglobin: 14.1 g/dL (ref 12.0–15.0)
Lymphocytes Relative: 26.3 % (ref 12.0–46.0)
Lymphs Abs: 2.9 10*3/uL (ref 0.7–4.0)
MCHC: 33.2 g/dL (ref 30.0–36.0)
MCV: 87.5 fl (ref 78.0–100.0)
Monocytes Absolute: 0.7 10*3/uL (ref 0.1–1.0)
Monocytes Relative: 6.8 % (ref 3.0–12.0)
Neutro Abs: 7.1 10*3/uL (ref 1.4–7.7)
Neutrophils Relative %: 64.1 % (ref 43.0–77.0)
Platelets: 256 10*3/uL (ref 150.0–400.0)
RBC: 4.85 Mil/uL (ref 3.87–5.11)
RDW: 13.9 % (ref 11.5–15.5)
WBC: 11 10*3/uL — ABNORMAL HIGH (ref 4.0–10.5)

## 2018-11-01 LAB — SEDIMENTATION RATE: Sed Rate: 7 mm/hr (ref 0–30)

## 2018-11-01 NOTE — Progress Notes (Signed)
Subjective:  Patient ID: [name], female    DOB: 07/13/1965  Age: 53 y.o. MRN: [id number]  CC: Back Pain (Right side above buttock; no known injury)   HPI Kimber Fritts Marcou presents for f/up - She complains of a 6-week history of nontraumatic, intermittent, sharp, nonradiating right lower back pain that worsens with movement.  She denies flank pain, hematuria, paresthesias, nausea, vomiting, fever, or chills.  She is getting symptom relief with meloxicam.  Outpatient Medications Prior to Visit  Medication Sig Dispense Refill  . Insulin Pen Needle (NOVOFINE) 32G X 6 MM MISC Inject 1 Act into the skin daily. 100 each 1  . levothyroxine (SYNTHROID) 50 MCG tablet Take 1 tablet (50 mcg total) by mouth daily. 90 tablet 1  . Liraglutide -Weight Management (SAXENDA) 18 MG/3ML SOPN Inject 3 mg into the skin daily. 5 pen 5  . meloxicam (MOBIC) 15 MG tablet Take 1 tablet (15 mg total) by mouth daily. 90 tablet 0  . mirtazapine (REMERON) 15 MG tablet Take 1 tablet (15 mg total) by mouth at bedtime. 90 tablet 1  . Riboflavin (B-2) 100 MG TABS Take 400 mg by mouth QID. 120 tablet 5   No facility-administered medications prior to visit.     ROS Review of Systems  Constitutional: Negative for diaphoresis, fatigue and unexpected weight change.  HENT: Negative.   Eyes: Negative.   Respiratory: Negative for cough, chest tightness, shortness of breath and wheezing.   Cardiovascular: Negative for chest pain, palpitations and leg swelling.  Gastrointestinal: Negative for abdominal pain, constipation, diarrhea, nausea and vomiting.  Endocrine: Negative.   Genitourinary: Negative for decreased urine volume, difficulty urinating, dysuria, flank pain, frequency, hematuria, urgency and vaginal bleeding.  Musculoskeletal: Positive for back pain. Negative for arthralgias, myalgias and neck pain.  Skin: Negative for color change and pallor.  Neurological: Negative.  Negative for dizziness, weakness,  light-headedness and headaches.  Hematological: Negative for adenopathy. Does not bruise/bleed easily.  Psychiatric/Behavioral: Negative.     Objective:  BP 122/88 (BP Location: Left Arm, Patient Position: Sitting, Cuff Size: Large)   Pulse 89   Temp 98.2 F (36.8 C) (Oral)   Ht 5\' 4"  (1.626 m)   Wt 215 lb (97.5 kg)   LMP 01/14/2018   SpO2 98%   BMI 36.90 kg/m   BP Readings from Last 3 Encounters:  11/01/18 122/88  08/29/18 116/86  04/23/18 120/78    Wt Readings from Last 3 Encounters:  11/01/18 215 lb (97.5 kg)  08/29/18 213 lb (96.6 kg)  04/23/18 219 lb (99.3 kg)    Physical Exam Vitals signs reviewed.  Constitutional:      General: She is not in acute distress.    Appearance: Normal appearance. She is obese. She is not toxic-appearing or diaphoretic.  HENT:     Nose: Nose normal.     Mouth/Throat:     Mouth: Mucous membranes are moist.  Eyes:     General: No scleral icterus.    Conjunctiva/sclera: Conjunctivae normal.  Neck:     Musculoskeletal: Normal range of motion. No neck rigidity.  Cardiovascular:     Rate and Rhythm: Normal rate and regular rhythm.     Heart sounds: No murmur.  Pulmonary:     Effort: Pulmonary effort is normal.     Breath sounds: No stridor. No wheezing, rhonchi or rales.  Abdominal:     General: Abdomen is protuberant. Bowel sounds are normal. There is no distension.     Palpations:  There is no hepatomegaly or splenomegaly.     Tenderness: There is no abdominal tenderness.  Musculoskeletal: Normal range of motion.     Lumbar back: Normal. She exhibits normal range of motion, no tenderness, no bony tenderness, no deformity, no pain and no spasm.     Right lower leg: No edema.     Left lower leg: No edema.  Lymphadenopathy:     Cervical: No cervical adenopathy.  Skin:    General: Skin is warm and dry.     Coloration: Skin is not pale.     Findings: No rash.  Neurological:     General: No focal deficit present.     Mental  Status: She is alert and oriented to person, place, and time. Mental status is at baseline.     Cranial Nerves: Cranial nerves are intact.     Sensory: Sensation is intact. No sensory deficit.     Motor: Motor function is intact. No weakness.     Coordination: Coordination is intact.     Gait: Gait normal.     Deep Tendon Reflexes: Reflexes are normal and symmetric. Reflexes normal.     Comments: Neg SLR in BLE  Psychiatric:        Mood and Affect: Mood normal.        Behavior: Behavior normal.     Lab Results  Component Value Date   WBC 11.0 (H) 11/01/2018   HGB 14.1 11/01/2018   HCT 42.5 11/01/2018   PLT 256.0 11/01/2018   GLUCOSE 90 08/29/2018   CHOL 183 01/18/2018   TRIG 113.0 08/29/2018   HDL 42.90 01/18/2018   LDLDIRECT 131.0 01/18/2018   LDLCALC 89 01/13/2012   ALT 12 01/18/2018   AST 12 01/18/2018   NA 141 08/29/2018   K 4.0 08/29/2018   CL 106 08/29/2018   CREATININE 0.78 08/29/2018   BUN 15 08/29/2018   CO2 27 08/29/2018   TSH 2.08 08/29/2018   HGBA1C 5.2 08/29/2018    Dg Elbow Complete Left  Result Date: 08/29/2018 CLINICAL DATA:  No known injury with elbow pain. EXAM: LEFT ELBOW - COMPLETE 3+ VIEW COMPARISON:  None. FINDINGS: There is no evidence of fracture, dislocation, or joint effusion. There is no evidence of arthropathy or other focal bone abnormality. Soft tissues are unremarkable. IMPRESSION: Negative. Electronically Signed   By: Sherian ReinWei-Chen  Lin M.D.   On: 08/29/2018 15:24    Dg Lumbar Spine Complete  Result Date: 11/02/2018 CLINICAL DATA:  Low back pain for 6 weeks EXAM: LUMBAR SPINE - COMPLETE 4+ VIEW COMPARISON:  January 13, 2012 FINDINGS: Frontal, lateral, spot lumbosacral lateral, and bilateral oblique views were obtained. There are 5 non-rib-bearing lumbar type vertebral bodies. There is no fracture or spondylolisthesis. There is slight disc space narrowing at L3-4, stable. Other disc spaces appear unremarkable and stable. There is mild facet  osteoarthritic change on the right at L4-5 and L5-S1 bilaterally. Intrauterine device is positioned within the mid pelvis. There is a calcification either in or overlying the right kidney measuring 1.3 x 1.1 cm. IMPRESSION: Stable slight disc space narrowing at L3-4. Other disc spaces appear unremarkable. There is facet osteoarthritic change on the right at L4-5 and L5-S1 bilaterally. No fracture or spondylolisthesis. Intrauterine device in midportion of pelvis. Calcification either in or overlying the right kidney measuring 1.3 x 1.1 cm. Electronically Signed   By: Bretta BangWilliam  Woodruff III M.D.   On: 11/02/2018 09:27    Assessment & Plan:   Victorino DikeJennifer was seen  today for back pain.  Diagnoses and all orders for this visit:  Need for influenza vaccination -     Flu Vaccine QUAD 36+ mos IM  Chronic right-sided low back pain without sciatica-she has mechanical, musculoskeletal right lower back pain with no radicular signs or symptoms.  Plain films are positive for arthritic changes.  I recommended that she continue taking meloxicam as needed. -     DG Lumbar Spine Complete; Future -     CBC with Differential/Platelet; Future -     Sedimentation rate; Future -     Urinalysis, Routine w reflex microscopic; Future -     CT RENAL STONE STUDY; Future  Need for shingles vaccine -     Varicella-zoster vaccine IM  Asymptomatic microscopic hematuria- She has about 20 red blood cells per high-powered field.  I have asked her to undergo a renal CT to see if there is a stone, cyst, tumor, or renal cell carcinoma. -     CT RENAL STONE STUDY; Future  Benign essential microscopic hematuria -     CT RENAL STONE STUDY; Future   I am having Adja A. Lile maintain her B-2, Liraglutide -Weight Management, Insulin Pen Needle, meloxicam, levothyroxine, and mirtazapine.  No orders of the defined types were placed in this encounter.    Follow-up: Return in about 3 months (around 01/31/2019).  Sanda Linger,  MD

## 2018-11-01 NOTE — Patient Instructions (Signed)
Acute Back Pain, Adult Acute back pain is sudden and usually short-lived. It is often caused by an injury to the muscles and tissues in the back. The injury may result from:  A muscle or ligament getting overstretched or torn (strained). Ligaments are tissues that connect bones to each other. Lifting something improperly can cause a back strain.  Wear and tear (degeneration) of the spinal disks. Spinal disks are circular tissue that provides cushioning between the bones of the spine (vertebrae).  Twisting motions, such as while playing sports or doing yard work.  A hit to the back.  Arthritis. You may have a physical exam, lab tests, and imaging tests to find the cause of your pain. Acute back pain usually goes away with rest and home care. Follow these instructions at home: Managing pain, stiffness, and swelling  Take over-the-counter and prescription medicines only as told by your health care provider.  Your health care provider may recommend applying ice during the first 24-48 hours after your pain starts. To do this: ? Put ice in a plastic bag. ? Place a towel between your skin and the bag. ? Leave the ice on for 20 minutes, 2-3 times a day.  If directed, apply heat to the affected area as often as told by your health care provider. Use the heat source that your health care provider recommends, such as a moist heat pack or a heating pad. ? Place a towel between your skin and the heat source. ? Leave the heat on for 20-30 minutes. ? Remove the heat if your skin turns bright red. This is especially important if you are unable to feel pain, heat, or cold. You have a greater risk of getting burned. Activity   Do not stay in bed. Staying in bed for more than 1-2 days can delay your recovery.  Sit up and stand up straight. Avoid leaning forward when you sit, or hunching over when you stand. ? If you work at a desk, sit close to it so you do not need to lean over. Keep your chin tucked  in. Keep your neck drawn back, and keep your elbows bent at a right angle. Your arms should look like the letter "L." ? Sit high and close to the steering wheel when you drive. Add lower back (lumbar) support to your car seat, if needed.  Take short walks on even surfaces as soon as you are able. Try to increase the length of time you walk each day.  Do not sit, drive, or stand in one place for more than 30 minutes at a time. Sitting or standing for long periods of time can put stress on your back.  Do not drive or use heavy machinery while taking prescription pain medicine.  Use proper lifting techniques. When you bend and lift, use positions that put less stress on your back: ? Bend your knees. ? Keep the load close to your body. ? Avoid twisting.  Exercise regularly as told by your health care provider. Exercising helps your back heal faster and helps prevent back injuries by keeping muscles strong and flexible.  Work with a physical therapist to make a safe exercise program, as recommended by your health care provider. Do any exercises as told by your physical therapist. Lifestyle  Maintain a healthy weight. Extra weight puts stress on your back and makes it difficult to have good posture.  Avoid activities or situations that make you feel anxious or stressed. Stress and anxiety increase muscle   tension and can make back pain worse. Learn ways to manage anxiety and stress, such as through exercise. General instructions  Sleep on a firm mattress in a comfortable position. Try lying on your side with your knees slightly bent. If you lie on your back, put a pillow under your knees.  Follow your treatment plan as told by your health care provider. This may include: ? Cognitive or behavioral therapy. ? Acupuncture or massage therapy. ? Meditation or yoga. Contact a health care provider if:  You have pain that is not relieved with rest or medicine.  You have increasing pain going down  into your legs or buttocks.  Your pain does not improve after 2 weeks.  You have pain at night.  You lose weight without trying.  You have a fever or chills. Get help right away if:  You develop new bowel or bladder control problems.  You have unusual weakness or numbness in your arms or legs.  You develop nausea or vomiting.  You develop abdominal pain.  You feel faint. Summary  Acute back pain is sudden and usually short-lived.  Use proper lifting techniques. When you bend and lift, use positions that put less stress on your back.  Take over-the-counter and prescription medicines and apply heat or ice as directed by your health care provider. This information is not intended to replace advice given to you by your health care provider. Make sure you discuss any questions you have with your health care provider. Document Released: 01/24/2005 Document Revised: 05/15/2018 Document Reviewed: 09/07/2016 Elsevier Patient Education  2020 [employer]  

## 2018-11-02 ENCOUNTER — Encounter: Payer: Self-pay | Admitting: Internal Medicine

## 2018-11-02 DIAGNOSIS — G8929 Other chronic pain: Secondary | ICD-10-CM | POA: Insufficient documentation

## 2018-11-02 DIAGNOSIS — R3121 Asymptomatic microscopic hematuria: Secondary | ICD-10-CM | POA: Insufficient documentation

## 2018-11-02 DIAGNOSIS — M545 Low back pain, unspecified: Secondary | ICD-10-CM | POA: Insufficient documentation

## 2018-11-02 LAB — URINALYSIS, ROUTINE W REFLEX MICROSCOPIC
Bilirubin Urine: NEGATIVE
Ketones, ur: NEGATIVE
Leukocytes,Ua: NEGATIVE
Nitrite: NEGATIVE
Specific Gravity, Urine: 1.02 (ref 1.000–1.030)
Urine Glucose: NEGATIVE
Urobilinogen, UA: 0.2 (ref 0.0–1.0)
pH: 7 (ref 5.0–8.0)

## 2018-11-04 ENCOUNTER — Other Ambulatory Visit: Payer: Self-pay | Admitting: Internal Medicine

## 2018-11-04 DIAGNOSIS — M545 Low back pain, unspecified: Secondary | ICD-10-CM

## 2018-11-04 DIAGNOSIS — M7712 Lateral epicondylitis, left elbow: Secondary | ICD-10-CM

## 2018-11-04 DIAGNOSIS — G8929 Other chronic pain: Secondary | ICD-10-CM

## 2018-11-04 MED ORDER — MELOXICAM 15 MG PO TABS: 15.0000 mg | ORAL_TABLET | Freq: Every day | ORAL | 1 refills | Status: DC

## 2018-11-05 NOTE — Telephone Encounter (Signed)
Pt states she has a high deductible plan and can not afford the CT scan. She is wondering if there is other options.  Please advise

## 2018-11-06 ENCOUNTER — Inpatient Hospital Stay: Admission: RE | Admit: 2018-11-06 | Payer: BC Managed Care – PPO | Source: Ambulatory Visit

## 2018-11-08 ENCOUNTER — Other Ambulatory Visit: Payer: Self-pay | Admitting: Internal Medicine

## 2018-11-08 DIAGNOSIS — R3121 Asymptomatic microscopic hematuria: Secondary | ICD-10-CM

## 2018-11-08 NOTE — Telephone Encounter (Signed)
Janith Lima, MD  You 3 minutes ago (4:56 PM)   I have ordered a urology referral   [name]   Message text

## 2018-11-08 NOTE — Telephone Encounter (Signed)
Pt informed of the referral.  

## 2018-12-18 ENCOUNTER — Ambulatory Visit (INDEPENDENT_AMBULATORY_CARE_PROVIDER_SITE_OTHER): Payer: BC Managed Care – PPO | Admitting: Internal Medicine

## 2018-12-18 ENCOUNTER — Other Ambulatory Visit: Payer: Self-pay

## 2018-12-18 ENCOUNTER — Encounter: Payer: Self-pay | Admitting: Internal Medicine

## 2018-12-18 DIAGNOSIS — J069 Acute upper respiratory infection, unspecified: Secondary | ICD-10-CM | POA: Diagnosis not present

## 2018-12-18 DIAGNOSIS — Z20822 Contact with and (suspected) exposure to covid-19: Secondary | ICD-10-CM

## 2018-12-18 MED ORDER — CEFDINIR 300 MG PO CAPS: 300.0000 mg | ORAL_CAPSULE | Freq: Two times a day (BID) | ORAL | 0 refills | Status: DC

## 2018-12-18 NOTE — Assessment & Plan Note (Signed)
Possibly Covid versus sinus infection She was tested for Covid today and she understands that she needs to remain quarantined Given her history of sinus infections and symptoms suggestive of a possible sinus infection we will go ahead and start antibiotic-Omnicef twice daily x10 days Over-the-counter cold medications as needed She will call with any questions or concerns

## 2018-12-18 NOTE — Progress Notes (Signed)
Virtual Visit via Video Note  I connected with [name] on 12/18/18 at  2:45 PM EST by a video enabled telemedicine application and verified that I am speaking with the correct person using two identifiers.   I discussed the limitations of evaluation and management by telemedicine and the availability of in person appointments. The patient expressed understanding and agreed to proceed.  Present for the visit:  Myself, Dr Cheryll Cockayne, Inda [place].  The patient is currently at home and I am in the office.    No referring provider.    History of Present Illness: This is an acute visit for cold symptoms  Her symptoms started 2 days ago.  She was in contact with her daughter 2 days earlier and she cold symptoms, but her stepmother had told her it was just allergies.  2 days later her and her 2 boys and her best friend are all sick.  She states she feels hot, but her temperature has been 94.7 or 95.  She is unsure if her thermometer is working.  She states nasal congestion with clear-yellow mucus, sinus pain and teeth pain yesterday, sore throat that is a burning sensation, but has now resolved.  She states everything tastes salty, she has a dry cough and shoulder aches.  She took Alka-Seltzer cold and sinus.  She did talk to her voice pediatrician and they wanted them to be tested for Covid and that was done today.  He does have a history of sinus of the, but states this does feel different than her usual sinus infections.    Review of Systems  Constitutional: Negative for fever (felt hot, 95 and 94.7).  HENT: Positive for congestion (clear - yellow), sinus pain (and teeth pain yesterday) and sore throat (burning sensation - now resolvec). Negative for ear pain.        Everything taste salty  Respiratory: Positive for cough (dry, occ sputum - clear). Negative for shortness of breath and wheezing.   Gastrointestinal: Negative for diarrhea and nausea.  Musculoskeletal: Positive for  myalgias (shoulders).  Neurological: Negative for headaches.      Social History   Socioeconomic History  . Marital status: Legally Separated    Spouse name: Not on file  . Number of children: Not on file  . Years of education: Not on file  . Highest education level: Not on file  Occupational History  . Not on file  Social Needs  . Financial resource strain: Not on file  . Food insecurity    Worry: Not on file    Inability: Not on file  . Transportation needs    Medical: Not on file    Non-medical: Not on file  Tobacco Use  . Smoking status: Never Smoker  . Smokeless tobacco: Never Used  Substance and Sexual Activity  . Alcohol use: No  . Drug use: No  . Sexual activity: Not Currently    Birth control/protection: I.U.D.  Lifestyle  . Physical activity    Days per week: Not on file    Minutes per session: Not on file  . Stress: Not on file  Relationships  . Social Musician on phone: Not on file    Gets together: Not on file    Attends religious service: Not on file    Active member of club or organization: Not on file    Attends meetings of clubs or organizations: Not on file    Relationship status: Not on file  Other Topics Concern  . Not on file  Social History Narrative  . Not on file     Observations/Objective: Appears well in NAD Breathing normally, cough intermittently that is dry Skin appears warm and dry  Assessment and Plan:  See Problem List for Assessment and Plan of chronic medical problems.   Follow Up Instructions:    I discussed the assessment and treatment plan with the patient. The patient was provided an opportunity to ask questions and all were answered. The patient agreed with the plan and demonstrated an understanding of the instructions.   The patient was advised to call back or seek an in-person evaluation if the symptoms worsen or if the condition fails to improve as anticipated.    Binnie Rail, MD

## 2018-12-20 ENCOUNTER — Emergency Department (HOSPITAL_COMMUNITY)
Admission: EM | Admit: 2018-12-20 | Discharge: 2018-12-20 | Disposition: A | Discharge status: Home / Self Care | Payer: BC Managed Care – PPO | Attending: Emergency Medicine | Admitting: Emergency Medicine

## 2018-12-20 ENCOUNTER — Other Ambulatory Visit: Payer: Self-pay

## 2018-12-20 ENCOUNTER — Emergency Department (HOSPITAL_COMMUNITY): Payer: BC Managed Care – PPO

## 2018-12-20 ENCOUNTER — Encounter (HOSPITAL_COMMUNITY): Payer: Self-pay | Admitting: *Deleted

## 2018-12-20 DIAGNOSIS — N132 Hydronephrosis with renal and ureteral calculous obstruction: Secondary | ICD-10-CM | POA: Insufficient documentation

## 2018-12-20 DIAGNOSIS — Z791 Long term (current) use of non-steroidal anti-inflammatories (NSAID): Secondary | ICD-10-CM | POA: Diagnosis not present

## 2018-12-20 DIAGNOSIS — E034 Atrophy of thyroid (acquired): Secondary | ICD-10-CM | POA: Diagnosis not present

## 2018-12-20 DIAGNOSIS — Z79899 Other long term (current) drug therapy: Secondary | ICD-10-CM | POA: Insufficient documentation

## 2018-12-20 DIAGNOSIS — R1031 Right lower quadrant pain: Secondary | ICD-10-CM | POA: Diagnosis present

## 2018-12-20 HISTORY — DX: Depression, unspecified: F32.A

## 2018-12-20 LAB — URINALYSIS, ROUTINE W REFLEX MICROSCOPIC
Bilirubin Urine: NEGATIVE
Glucose, UA: NEGATIVE mg/dL
Ketones, ur: NEGATIVE mg/dL
Nitrite: NEGATIVE
Protein, ur: 100 mg/dL — AB
RBC / HPF: >50 RBC/hpf — ABNORMAL HIGH (ref 0–5)
Specific Gravity, Urine: 1.018 (ref 1.005–1.030)
pH: 6 (ref 5.0–8.0)

## 2018-12-20 LAB — BASIC METABOLIC PANEL
Anion gap: 9 (ref 5–15)
BUN: 10 mg/dL (ref 6–20)
CO2: 25 mmol/L (ref 22–32)
Calcium: 9 mg/dL (ref 8.9–10.3)
Chloride: 105 mmol/L (ref 98–111)
Creatinine, Ser: 0.78 mg/dL (ref 0.44–1.00)
GFR calc Af Amer: >60 mL/min (ref >60)
GFR calc non Af Amer: >60 mL/min (ref >60)
Glucose, Bld: 108 mg/dL — ABNORMAL HIGH (ref 70–99)
Potassium: 4.1 mmol/L (ref 3.5–5.1)
Sodium: 139 mmol/L (ref 135–145)

## 2018-12-20 LAB — I-STAT BETA HCG BLOOD, ED (MC, WL, AP ONLY): I-stat hCG, quantitative: <5 m[IU]/mL (ref <5)

## 2018-12-20 LAB — CBC
HCT: 42.9 % (ref 36.0–46.0)
Hemoglobin: 14 g/dL (ref 12.0–15.0)
MCH: 29 pg (ref 26.0–34.0)
MCHC: 32.6 g/dL (ref 30.0–36.0)
MCV: 88.8 fL (ref 80.0–100.0)
Platelets: 230 10*3/uL (ref 150–400)
RBC: 4.83 MIL/uL (ref 3.87–5.11)
RDW: 12.8 % (ref 11.5–15.5)
WBC: 10.6 10*3/uL — ABNORMAL HIGH (ref 4.0–10.5)
nRBC: 0 % (ref 0.0–0.2)

## 2018-12-20 MED ORDER — ONDANSETRON HCL 4 MG/2ML IJ SOLN
4.0000 mg | Freq: Once | INTRAMUSCULAR | Status: AC
  Administered 2018-12-20: 4 mg via INTRAVENOUS
  Filled 2018-12-20: qty 2

## 2018-12-20 MED ORDER — KETOROLAC TROMETHAMINE 30 MG/ML IJ SOLN
30.0000 mg | Freq: Once | INTRAMUSCULAR | Status: AC
  Administered 2018-12-20: 30 mg via INTRAVENOUS
  Filled 2018-12-20: qty 1

## 2018-12-20 MED ORDER — HYDROMORPHONE HCL 1 MG/ML IJ SOLN
0.5000 mg | Freq: Once | INTRAMUSCULAR | Status: AC
  Administered 2018-12-20: 0.5 mg via INTRAVENOUS
  Filled 2018-12-20: qty 1

## 2018-12-20 MED ORDER — OXYCODONE-ACETAMINOPHEN 5-325 MG PO TABS: 1.0000 | ORAL_TABLET | Freq: Three times a day (TID) | ORAL | 0 refills | Status: DC | PRN

## 2018-12-20 MED ORDER — ONDANSETRON 4 MG PO TBDP: 4.0000 mg | ORAL_TABLET | Freq: Three times a day (TID) | ORAL | 0 refills | Status: DC | PRN

## 2018-12-20 NOTE — ED Triage Notes (Signed)
Patient presents to ED via [facility] states she was awaken by right flank pain approx. 3 hours ago sudden onset had nausea and several episodes of vomitng. Patient was given Zofran 4 mg and Fentanyl 150 mg per ems.

## 2018-12-20 NOTE — ED Provider Notes (Signed)
[facility] EMERGENCY DEPARTMENT Provider Note   CSN: [id number] Arrival date & time: 12/20/18  0556     History   Chief Complaint Chief Complaint  Patient presents with  . Flank Pain    HPI [name] is a 53 y.o. female.     HPI Patient presents with right flank/abdominal pain.  Began around 5 hours ago suddenly and woke her from sleep.  Has vomited but that has not helped the pain.  States she feels as if she needs to urinate.  No fevers or chills.  No diarrhea.  No constipation.  Pain is sharp.  Has had a previous appendectomy but also has had previous kidney stones but states that was around 13 years ago. Past Medical History:  Diagnosis Date  . Depression   . Kidney stones   . Migraines     Patient Active Problem List   Diagnosis Date Noted  . URI (upper respiratory infection) 12/18/2018  . Chronic right-sided low back pain without sciatica 11/02/2018  . Asymptomatic microscopic hematuria 11/02/2018  . Pure hypertriglyceridemia 08/29/2018  . Hyperglycemia 08/29/2018  . Hypothyroidism due to acquired atrophy of thyroid 05/29/2018  . Morbid obesity with BMI of 40.0-44.9, adult ([place]) 01/18/2018  . Obstructive sleep apnea 04/03/2015  . Routine adult health maintenance 11/13/2013  . Encounter for screening mammogram for breast cancer 11/13/2013  . Migraine headache 05/14/2012  . Severe major depression ([place]) 09/05/2006  . Seasonal and perennial allergic rhinitis 09/05/2006    Past Surgical History:  Procedure Laterality Date  . APPENDECTOMY    . laporoscopy    . LITHOTRIPSY    . WISDOM TOOTH EXTRACTION       OB History   No obstetric history on file.      Home Medications    Prior to Admission medications   Medication Sig Start Date End Date Taking? Authorizing Provider  cefdinir (OMNICEF) 300 MG capsule Take 1 capsule (300 mg total) by mouth 2 (two) times daily. 12/18/18  Yes Burns, Claudina Lick, MD  levonorgestrel (MIRENA, 52 MG,)  20 MCG/24HR IUD 1 each by Intrauterine route once. 12/05/12  Yes [provider]  levothyroxine (SYNTHROID) 50 MCG tablet Take 1 tablet (50 mcg total) by mouth daily. 09/11/18  Yes Janith Lima, MD  Liraglutide -Weight Management (SAXENDA) 18 MG/3ML SOPN Inject 3 mg into the skin daily. Patient taking differently: Inject 3 mg into the skin at bedtime.  05/29/18  Yes Janith Lima, MD  meloxicam (MOBIC) 15 MG tablet Take 1 tablet (15 mg total) by mouth daily. 11/04/18  Yes Janith Lima, MD  mirtazapine (REMERON) 15 MG tablet Take 1 tablet (15 mg total) by mouth at bedtime. 09/24/18  Yes Janith Lima, MD  PE-DM-APAP & PE-Doxyl-DM-APAP (ALKA-SELTZER PLUS DAY/NIGHT) MISC Take 2 capsules by mouth every 6 (six) hours as needed (cold symptoms).   Yes [provider]  Riboflavin (B-2) 100 MG TABS Take 400 mg by mouth QID. 01/18/18  Yes Janith Lima, MD  Insulin Pen Needle (NOVOFINE) 32G X 6 MM MISC Inject 1 Act into the skin daily. 05/29/18   Janith Lima, MD  ondansetron (ZOFRAN-ODT) 4 MG disintegrating tablet Take 1 tablet (4 mg total) by mouth every 8 (eight) hours as needed for nausea or vomiting. 12/20/18   Davonna Belling, MD  oxyCODONE-acetaminophen (PERCOCET/ROXICET) 5-325 MG tablet Take 1-2 tablets by mouth every 8 (eight) hours as needed for severe pain. 12/20/18   Davonna Belling, MD  Family History Family History  Problem Relation Age of Onset  . Diabetes Mother   . Alcohol abuse Mother   . Diabetes Father   . Heart disease Father   . Alcohol abuse Father   . Cancer Neg Hx   . Stroke Neg Hx   . Hyperlipidemia Neg Hx   . Hypertension Neg Hx   . Kidney disease Neg Hx   . Arthritis Neg Hx     Social History Social History   Tobacco Use  . Smoking status: Never Smoker  . Smokeless tobacco: Never Used  Substance Use Topics  . Alcohol use: Yes  . Drug use: No     Allergies   Hydrocodone-homatropine, Sulfa antibiotics, Sulfacetamide sodium,  and Nickel   Review of Systems Review of Systems  Constitutional: Negative for appetite change.  HENT: Negative for congestion.   Respiratory: Negative for shortness of breath.   Cardiovascular: Negative for chest pain.  Gastrointestinal: Positive for abdominal pain, nausea and vomiting.  Genitourinary: Positive for flank pain.  Musculoskeletal: Positive for back pain.  Neurological: Negative for weakness.  Psychiatric/Behavioral: Negative for confusion.     Physical Exam Updated Vital Signs BP 112/77 (BP Location: Right Arm)   Pulse 65   Temp 97.7 F (36.5 C) (Oral)   Resp 16   Ht 5\' 4"  (1.626 m)   Wt 94.3 kg   LMP 01/14/2018   SpO2 96%   BMI 35.70 kg/m   Physical Exam Vitals signs and nursing note reviewed.  HENT:     Head: Atraumatic.  Eyes:     Pupils: Pupils are equal, round, and reactive to light.  Neck:     Musculoskeletal: Neck supple.  Cardiovascular:     Rate and Rhythm: Regular rhythm.  Pulmonary:     Breath sounds: Normal breath sounds.  Abdominal:     Comments: Mild right abdominal tenderness.  Some tenderness on the right lower CVA area also.  Genitourinary:    Comments: Right lower CVA tenderness. Skin:    General: Skin is warm.     Capillary Refill: Capillary refill takes less than 2 seconds.  Neurological:     Mental Status: She is alert and oriented to person, place, and time.      ED Treatments / Results  Labs (all labs ordered are listed, but only abnormal results are displayed) Labs Reviewed  URINALYSIS, ROUTINE W REFLEX MICROSCOPIC - Abnormal; Notable for the following components:      Result Value   Hgb urine dipstick LARGE (*)    Protein, ur 100 (*)    Leukocytes,Ua SMALL (*)    RBC / HPF >50 (*)    Bacteria, UA RARE (*)    All other components within normal limits  CBC - Abnormal; Notable for the following components:   WBC 10.6 (*)    All other components within normal limits  BASIC METABOLIC PANEL - Abnormal; Notable  for the following components:   Glucose, Bld 108 (*)    All other components within normal limits  I-STAT BETA HCG BLOOD, ED ([facility], [facility], [facility] ONLY)    EKG None  Radiology Ct Renal Stone Study  Result Date: 12/20/2018 CLINICAL DATA:  RIGHT flank pain since 0300 hours this morning, history of kidney stones suspected recurrent disease EXAM: CT ABDOMEN AND PELVIS WITHOUT CONTRAST TECHNIQUE: Multidetector CT imaging of the abdomen and pelvis was performed following the standard protocol without IV contrast. Sagittal and coronal MPR images reconstructed from axial data set. Oral contrast  was not administered COMPARISON:  None FINDINGS: Lower chest: Lung bases clear Hepatobiliary: Gallbladder and liver normal appearance Pancreas: Normal appearance Spleen: Normal appearance Adrenals/Urinary Tract: Adrenal glands and LEFT kidney normal appearance. RIGHT hydronephrosis secondary to a 10 x 9 x 11 mm RIGHT UPJ calculus. Ureters and bladder normal appearance Stomach/Bowel: Prior appendectomy. Low lying cecum in pelvis. Stomach and bowel loops unremarkable. Vascular/Lymphatic: Few pelvic phleboliths. Aorta normal caliber. No adenopathy. Reproductive: IUD within uterus. Uterus and adnexa otherwise normal appearance Other: Small umbilical hernia containing fat. No free air or free fluid. Musculoskeletal: Probable bone islands posterior column RIGHT acetabulum and LEFT femoral head. No acute osseous findings. IMPRESSION: RIGHT hydronephrosis secondary to a 10 x 9 x 11 mm RIGHT UPJ calculus. Small umbilical hernia containing fat. IUD within uterus. Electronically Signed   By: Ulyses SouthwardMark  Boles M.D.   On: 12/20/2018 08:37    Procedures Procedures (including critical care time)  Medications Ordered in ED Medications  ondansetron (ZOFRAN) injection 4 mg (4 mg Intravenous Given 12/20/18 0755)  HYDROmorphone (DILAUDID) injection 0.5 mg (0.5 mg Intravenous Given 12/20/18 0756)  HYDROmorphone (DILAUDID) injection 0.5 mg (0.5 mg  Intravenous Given 12/20/18 0906)  ketorolac (TORADOL) 30 MG/ML injection 30 mg (30 mg Intravenous Given 12/20/18 0951)     Initial Impression / Assessment and Plan / ED Course  I have reviewed the triage vital signs and the nursing notes.  Pertinent labs & imaging results that were available during my care of the patient were reviewed by me and considered in my medical decision making (see chart for details).        Patient with flank pain.  Found to have a large proximal ureteral stone.  No infection.  Pain improved after treatment.  Discussed with Dr. Mena GoesEskridge.  Given Toradol even though likely will eventually have lithotripsy will not be done today.  Will return for pain is uncontrolled.  We will follow-up in the office will call you soon as possible.  Final Clinical Impressions(s) / ED Diagnoses   Final diagnoses:  Ureteral stone with hydronephrosis    ED Discharge Orders         Ordered    oxyCODONE-acetaminophen (PERCOCET/ROXICET) 5-325 MG tablet  Every 8 hours PRN     12/20/18 1108    ondansetron (ZOFRAN-ODT) 4 MG disintegrating tablet  Every 8 hours PRN     12/20/18 1108           Benjiman CorePickering, Duward Allbritton, MD 12/20/18 1530

## 2018-12-20 NOTE — Discharge Instructions (Addendum)
Call [facility] today about following up.  Watch for pain is uncontrolled or development of fever.

## 2018-12-21 ENCOUNTER — Other Ambulatory Visit: Payer: Self-pay | Admitting: Urology

## 2018-12-21 ENCOUNTER — Encounter (HOSPITAL_COMMUNITY): Payer: Self-pay | Admitting: *Deleted

## 2018-12-21 ENCOUNTER — Other Ambulatory Visit: Payer: Self-pay

## 2018-12-21 LAB — NOVEL CORONAVIRUS, NAA: SARS-CoV-2, NAA: NOT DETECTED

## 2018-12-22 ENCOUNTER — Other Ambulatory Visit (HOSPITAL_COMMUNITY)
Admission: RE | Admit: 2018-12-22 | Discharge: 2018-12-22 | Disposition: A | Discharge status: Home / Self Care | Payer: BC Managed Care – PPO | Source: Ambulatory Visit | Attending: Urology | Admitting: Urology

## 2018-12-22 DIAGNOSIS — Z01812 Encounter for preprocedural laboratory examination: Secondary | ICD-10-CM | POA: Diagnosis present

## 2018-12-22 DIAGNOSIS — Z20828 Contact with and (suspected) exposure to other viral communicable diseases: Secondary | ICD-10-CM | POA: Diagnosis not present

## 2018-12-22 LAB — SARS CORONAVIRUS 2 (TAT 6-24 HRS): SARS Coronavirus 2: NEGATIVE

## 2018-12-24 ENCOUNTER — Encounter (HOSPITAL_COMMUNITY)
Admission: RE | Disposition: A | Discharge status: Home / Self Care | Payer: Self-pay | Source: Home / Self Care | Attending: Urology

## 2018-12-24 ENCOUNTER — Ambulatory Visit (HOSPITAL_COMMUNITY): Payer: BC Managed Care – PPO | Admitting: Anesthesiology

## 2018-12-24 ENCOUNTER — Encounter (HOSPITAL_COMMUNITY): Payer: Self-pay | Admitting: *Deleted

## 2018-12-24 ENCOUNTER — Ambulatory Visit (HOSPITAL_COMMUNITY)
Admission: RE | Admit: 2018-12-24 | Discharge: 2018-12-24 | Disposition: A | Discharge status: Home / Self Care | Payer: BC Managed Care – PPO | Attending: Urology | Admitting: Urology

## 2018-12-24 ENCOUNTER — Other Ambulatory Visit: Payer: Self-pay

## 2018-12-24 ENCOUNTER — Ambulatory Visit (HOSPITAL_COMMUNITY): Payer: BC Managed Care – PPO

## 2018-12-24 DIAGNOSIS — Z79890 Hormone replacement therapy (postmenopausal): Secondary | ICD-10-CM | POA: Diagnosis not present

## 2018-12-24 DIAGNOSIS — N200 Calculus of kidney: Secondary | ICD-10-CM | POA: Insufficient documentation

## 2018-12-24 DIAGNOSIS — Z975 Presence of (intrauterine) contraceptive device: Secondary | ICD-10-CM | POA: Diagnosis not present

## 2018-12-24 DIAGNOSIS — Z794 Long term (current) use of insulin: Secondary | ICD-10-CM | POA: Insufficient documentation

## 2018-12-24 DIAGNOSIS — G43909 Migraine, unspecified, not intractable, without status migrainosus: Secondary | ICD-10-CM | POA: Diagnosis not present

## 2018-12-24 DIAGNOSIS — G4730 Sleep apnea, unspecified: Secondary | ICD-10-CM | POA: Diagnosis not present

## 2018-12-24 DIAGNOSIS — Z79899 Other long term (current) drug therapy: Secondary | ICD-10-CM | POA: Insufficient documentation

## 2018-12-24 DIAGNOSIS — Z791 Long term (current) use of non-steroidal anti-inflammatories (NSAID): Secondary | ICD-10-CM | POA: Diagnosis not present

## 2018-12-24 DIAGNOSIS — F329 Major depressive disorder, single episode, unspecified: Secondary | ICD-10-CM | POA: Insufficient documentation

## 2018-12-24 HISTORY — DX: Personal history of urinary calculi: Z87.442

## 2018-12-24 HISTORY — DX: Other complications of anesthesia, initial encounter: T88.59XA

## 2018-12-24 HISTORY — PX: CYSTOSCOPY W/ URETERAL STENT PLACEMENT: SHX1429

## 2018-12-24 SURGERY — CYSTOSCOPY, WITH RETROGRADE PYELOGRAM AND URETERAL STENT INSERTION
Anesthesia: General | Laterality: Right

## 2018-12-24 MED ORDER — OXYBUTYNIN CHLORIDE 5 MG PO TABS: 5.0000 mg | ORAL_TABLET | Freq: Three times a day (TID) | ORAL | 1 refills | Status: DC | PRN

## 2018-12-24 MED ORDER — OXYCODONE HCL 5 MG PO TABS
5.0000 mg | ORAL_TABLET | Freq: Once | ORAL | Status: AC
  Administered 2018-12-24: 5 mg via ORAL

## 2018-12-24 MED ORDER — PROMETHAZINE HCL 25 MG/ML IJ SOLN: 6.2500 mg | INTRAMUSCULAR | Status: DC | PRN

## 2018-12-24 MED ORDER — CEFAZOLIN SODIUM-DEXTROSE 2-4 GM/100ML-% IV SOLN
2.0000 g | INTRAVENOUS | Status: AC
  Administered 2018-12-24: 2 g via INTRAVENOUS
  Filled 2018-12-24: qty 100

## 2018-12-24 MED ORDER — DEXAMETHASONE SODIUM PHOSPHATE 10 MG/ML IJ SOLN
INTRAMUSCULAR | Status: DC | PRN
  Administered 2018-12-24: 10 mg via INTRAVENOUS

## 2018-12-24 MED ORDER — ONDANSETRON HCL 4 MG/2ML IJ SOLN
INTRAMUSCULAR | Status: AC
  Filled 2018-12-24: qty 2

## 2018-12-24 MED ORDER — KETOROLAC TROMETHAMINE 30 MG/ML IJ SOLN
30.0000 mg | Freq: Once | INTRAMUSCULAR | Status: AC | PRN
  Administered 2018-12-24: 30 mg via INTRAVENOUS

## 2018-12-24 MED ORDER — OXYCODONE HCL 5 MG PO TABS
ORAL_TABLET | ORAL | Status: AC
  Filled 2018-12-24: qty 1

## 2018-12-24 MED ORDER — KETOROLAC TROMETHAMINE 30 MG/ML IJ SOLN
INTRAMUSCULAR | Status: AC
  Filled 2018-12-24: qty 1

## 2018-12-24 MED ORDER — MIDAZOLAM HCL 5 MG/5ML IJ SOLN
INTRAMUSCULAR | Status: DC | PRN
  Administered 2018-12-24: 2 mg via INTRAVENOUS

## 2018-12-24 MED ORDER — FENTANYL CITRATE (PF) 100 MCG/2ML IJ SOLN
INTRAMUSCULAR | Status: DC | PRN
  Administered 2018-12-24: 50 ug via INTRAVENOUS

## 2018-12-24 MED ORDER — ONDANSETRON HCL 4 MG/2ML IJ SOLN
INTRAMUSCULAR | Status: DC | PRN
  Administered 2018-12-24: 4 mg via INTRAVENOUS

## 2018-12-24 MED ORDER — LACTATED RINGERS IV SOLN
INTRAVENOUS | Status: DC
  Administered 2018-12-24: 06:00:00 via INTRAVENOUS

## 2018-12-24 MED ORDER — IOHEXOL 300 MG/ML  SOLN
INTRAMUSCULAR | Status: DC | PRN
  Administered 2018-12-24: 50 mL

## 2018-12-24 MED ORDER — FENTANYL CITRATE (PF) 100 MCG/2ML IJ SOLN
INTRAMUSCULAR | Status: AC
  Filled 2018-12-24: qty 2

## 2018-12-24 MED ORDER — LIDOCAINE HCL (CARDIAC) PF 100 MG/5ML IV SOSY
PREFILLED_SYRINGE | INTRAVENOUS | Status: DC | PRN
  Administered 2018-12-24: 80 mg via INTRAVENOUS

## 2018-12-24 MED ORDER — FENTANYL CITRATE (PF) 100 MCG/2ML IJ SOLN
25.0000 ug | INTRAMUSCULAR | Status: DC | PRN
  Administered 2018-12-24: 50 ug via INTRAVENOUS

## 2018-12-24 MED ORDER — LIDOCAINE 2% (20 MG/ML) 5 ML SYRINGE
INTRAMUSCULAR | Status: AC
  Filled 2018-12-24: qty 5

## 2018-12-24 MED ORDER — DEXAMETHASONE SODIUM PHOSPHATE 10 MG/ML IJ SOLN
INTRAMUSCULAR | Status: AC
  Filled 2018-12-24: qty 1

## 2018-12-24 MED ORDER — KETOROLAC TROMETHAMINE 15 MG/ML IJ SOLN
INTRAMUSCULAR | Status: AC
  Filled 2018-12-24: qty 1

## 2018-12-24 MED ORDER — PROPOFOL 10 MG/ML IV BOLUS
INTRAVENOUS | Status: AC
  Filled 2018-12-24: qty 20

## 2018-12-24 MED ORDER — MIDAZOLAM HCL 2 MG/2ML IJ SOLN
INTRAMUSCULAR | Status: AC
  Filled 2018-12-24: qty 2

## 2018-12-24 MED ORDER — PROPOFOL 10 MG/ML IV BOLUS
INTRAVENOUS | Status: DC | PRN
  Administered 2018-12-24: 150 mg via INTRAVENOUS

## 2018-12-24 SURGICAL SUPPLY — 13 items
BAG URO CATCHER STRL LF (MISCELLANEOUS) ×3 IMPLANT
CATH INTERMIT  6FR 70CM (CATHETERS) IMPLANT
CLOTH BEACON ORANGE TIMEOUT ST (SAFETY) ×3 IMPLANT
GLOVE BIOGEL M 8.0 STRL (GLOVE) ×3 IMPLANT
GOWN STRL REUS W/TWL XL LVL3 (GOWN DISPOSABLE) ×3 IMPLANT
GUIDEWIRE ANG ZIPWIRE 038X150 (WIRE) IMPLANT
GUIDEWIRE STR DUAL SENSOR (WIRE) ×3 IMPLANT
KIT TURNOVER KIT A (KITS) IMPLANT
MANIFOLD NEPTUNE II (INSTRUMENTS) ×3 IMPLANT
PACK CYSTO (CUSTOM PROCEDURE TRAY) ×3 IMPLANT
STENT CONTOUR 6FRX24X.038 (STENTS) ×3 IMPLANT
TUBING CONNECTING 10 (TUBING) ×2 IMPLANT
TUBING CONNECTING 10' (TUBING) ×1

## 2018-12-24 NOTE — Discharge Instructions (Signed)

## 2018-12-24 NOTE — H&P (Signed)
H&P  Chief Complaint: Right UPJ stone  History of Present Illness: [name] is a 53 y.o. year old female presenting for cystoscopy and right double-J stent placement for a large right UPJ stone in advance of eventual shockwave lithotripsy.  Past Medical History:  Diagnosis Date  . Complication of anesthesia    SLOW TO AWAKEN  . Depression   . History of kidney stones   . Migraines   . Sinus infection   . Sleep apnea    MILD NO CPAP NEEDED    Past Surgical History:  Procedure Laterality Date  . APPENDECTOMY    . BREAST SURGERY    . laporoscopy    . LITHOTRIPSY    . WISDOM TOOTH EXTRACTION    . WRIST SURGERY Right    CYST REMOVED    Home Medications:  Medications Prior to Admission  Medication Sig Dispense Refill  . cefdinir (OMNICEF) 300 MG capsule Take 1 capsule (300 mg total) by mouth 2 (two) times daily. (Patient taking differently: Take 300 mg by mouth 2 (two) times daily. STARTING 12-21-2018) 20 capsule 0  . levonorgestrel (MIRENA, 52 MG,) 20 MCG/24HR IUD 1 each by Intrauterine route once.    [name] levothyroxine (SYNTHROID) 50 MCG tablet Take 1 tablet (50 mcg total) by mouth daily. 90 tablet 1  . mirtazapine (REMERON) 15 MG tablet Take 1 tablet (15 mg total) by mouth at bedtime. 90 tablet 1  . PE-DM-APAP & PE-Doxyl-DM-APAP (ALKA-SELTZER PLUS DAY/NIGHT) MISC Take 2 capsules by mouth every 6 (six) hours as needed (cold symptoms).    . Riboflavin (B-2) 100 MG TABS Take 400 mg by mouth QID. 120 tablet 5  . Insulin Pen Needle (NOVOFINE) 32G X 6 MM MISC Inject 1 Act into the skin daily. 100 each 1  . Liraglutide -Weight Management (SAXENDA) 18 MG/3ML SOPN Inject 3 mg into the skin daily. (Patient taking differently: Inject 3 mg into the skin at bedtime. ) 5 pen 5  . meloxicam (MOBIC) 15 MG tablet Take 1 tablet (15 mg total) by mouth daily. 90 tablet 1  . ondansetron (ZOFRAN-ODT) 4 MG disintegrating tablet Take 1 tablet (4 mg total) by mouth every 8 (eight) hours as needed  for nausea or vomiting. 8 tablet 0  . oxyCODONE-acetaminophen (PERCOCET/ROXICET) 5-325 MG tablet Take 1-2 tablets by mouth every 8 (eight) hours as needed for severe pain. 15 tablet 0  . SUMAtriptan (IMITREX) 50 MG tablet Take 50 mg by mouth every 2 (two) hours as needed for migraine. May repeat in 2 hours if headache persists or recurs.      Allergies:  Allergies  Allergen Reactions  . Hydrocodone-Homatropine Itching and Rash  . Sulfa Antibiotics Nausea And Vomiting  . Sulfacetamide Sodium     NAUSEA  . Nickel Rash    Family History  Problem Relation Age of Onset  . Diabetes Mother   . Alcohol abuse Mother   . Diabetes Father   . Heart disease Father   . Alcohol abuse Father   . Cancer Neg Hx   . Stroke Neg Hx   . Hyperlipidemia Neg Hx   . Hypertension Neg Hx   . Kidney disease Neg Hx   . Arthritis Neg Hx     Social History:  reports that she has never smoked. She has never used smokeless tobacco. She reports previous alcohol use. She reports that she does not use drugs.  ROS: A complete review of systems was performed.  All systems are negative except  for pertinent findings as noted.  Physical Exam:  Vital signs in last 24 hours: Temp:  [98.4 F (36.9 C)] 98.4 F (36.9 C) (11/16 0554) Pulse Rate:  [83] 83 (11/16 0554) Resp:  [12] 12 (11/16 0554) BP: (128)/(93) 128/93 (11/16 0554) SpO2:  [97 %] 97 % (11/16 0554) Weight:  [94 kg] 94 kg (11/16 0554) General:  Alert and oriented, No acute distress HEENT: Normocephalic, atraumatic Neck: No JVD or lymphadenopathy Cardiovascular: Regular rate and rhythm Lungs: Clear bilaterally Abdomen: Soft, nontender, nondistended, no abdominal masses Back: No CVA tenderness Extremities: No edema Neurologic: Grossly intact  Laboratory Data:  No results found for this or any previous visit (from the past 24 hour(s)). Recent Results (from the past 240 hour(s))  Novel Coronavirus, NAA ([facility])     Status: None   Collection Time:  12/18/18 12:00 AM   Specimen: Nasopharyngeal(NP) swabs in vial transport medium   NASOPHARYNGE  TESTING  Result Value Ref Range Status   SARS-CoV-2, NAA Not Detected Not Detected Final    Comment: Testing was performed using the Aptima SARS-CoV-2 assay. This nucleic acid amplification test was developed and its performance characteristics determined by [employer]. Nucleic acid amplification tests include PCR and TMA. This test has not been FDA cleared or approved. This test has been authorized by FDA under an Emergency Use Authorization (EUA). This test is only authorized for the duration of time the declaration that circumstances exist justifying the authorization of the emergency use of in vitro diagnostic tests for detection of SARS-CoV-2 virus and/or diagnosis of COVID-19 infection under section 564(b)(1) of the Act, 21 U.S.C. 706CBJ-6(E) (1), unless the authorization is terminated or revoked sooner. When diagnostic testing is negative, the possibility of a false negative result should be considered in the context of a patient's recent exposures and the presence of clinical signs and symptoms consistent with COVID-19. An individual without symptoms o f COVID-19 and who is not shedding SARS-CoV-2 virus would expect to have a negative (not detected) result in this assay.   SARS CORONAVIRUS 2 (TAT 6-24 HRS) Nasopharyngeal Nasopharyngeal Swab     Status: None   Collection Time: 12/22/18  5:00 PM   Specimen: Nasopharyngeal Swab  Result Value Ref Range Status   SARS Coronavirus 2 NEGATIVE NEGATIVE Final    Comment: (NOTE) SARS-CoV-2 target nucleic acids are NOT DETECTED. The SARS-CoV-2 RNA is generally detectable in upper and lower respiratory specimens during the acute phase of infection. Negative results do not preclude SARS-CoV-2 infection, do not rule out co-infections with other pathogens, and should not be used as the sole basis for treatment or other patient  management decisions. Negative results must be combined with clinical observations, patient history, and epidemiological information. The expected result is Negative. Fact Sheet for Patients: SugarRoll.be Fact Sheet for Healthcare Providers: https://www.woods-mathews.com/ This test is not yet approved or cleared by the [place] FDA and  has been authorized for detection and/or diagnosis of SARS-CoV-2 by FDA under an Emergency Use Authorization (EUA). This EUA will remain  in effect (meaning this test can be used) for the duration of the COVID-19 declaration under Section 56 4(b)(1) of the Act, 21 U.S.C. section 360bbb-3(b)(1), unless the authorization is terminated or revoked sooner. Performed at [facility], [place] [address], [place], [place] [address]    Creatinine: Recent Labs    12/20/18 0612  CREATININE 0.78    Radiologic Imaging: No results found.  Impression/Assessment:  11 mm right UPJ stone  Plan:  1.  Cystoscopy, right retrograde, placement of double-J stent and right ureter  2.  [facility] 11.20.2020  Bertram MillardStephen M Khole Arterburn 12/24/2018, 7:29 AM  Bertram MillardStephen M. Glendon Fiser MD

## 2018-12-24 NOTE — Anesthesia Preprocedure Evaluation (Addendum)
Anesthesia Evaluation  Patient identified by MRN, date of birth, ID band Patient awake    Reviewed: Allergy & Precautions, NPO status , Patient's Chart, lab work & pertinent test results  Airway Mallampati: II  TM Distance: >3 FB Neck ROM: Full    Dental no notable dental hx.    Pulmonary neg pulmonary ROS,    Pulmonary exam normal breath sounds clear to auscultation       Cardiovascular negative cardio ROS Normal cardiovascular exam Rhythm:Regular Rate:Normal     Neuro/Psych Depression negative neurological ROS     GI/Hepatic negative GI ROS, Neg liver ROS,   Endo/Other  negative endocrine ROS  Renal/GU negative Renal ROS  negative genitourinary   Musculoskeletal negative musculoskeletal ROS (+)   Abdominal   Peds negative pediatric ROS (+)  Hematology negative hematology ROS (+)   Anesthesia Other Findings   Reproductive/Obstetrics negative OB ROS                             Anesthesia Physical Anesthesia Plan  ASA: II  Anesthesia Plan: General   Post-op Pain Management:    Induction: Intravenous  PONV Risk Score and Plan: 3 and Ondansetron, Dexamethasone and Treatment may vary due to age or medical condition  Airway Management Planned: LMA  Additional Equipment:   Intra-op Plan:   Post-operative Plan: Extubation in OR  Informed Consent: I have reviewed the patients History and Physical, chart, labs and discussed the procedure including the risks, benefits and alternatives for the proposed anesthesia with the patient or authorized representative who has indicated his/her understanding and acceptance.     Dental advisory given  Plan Discussed with: CRNA and Surgeon  Anesthesia Plan Comments:         Anesthesia Quick Evaluation

## 2018-12-24 NOTE — Op Note (Signed)
Preop diagnosis: Right UPJ stone  Postoperative diagnosis: Right renal pelvic stone  Principal procedure: Cystoscopy, right retrograde ureteropyelogram, fluoroscopic interpretation, placement of 6 French by 24 cm contour double-J stent without tether  Surgeon: Laruth Hanger  Anesthesia: General with LMA  Complications: None  Specimen: None  Estimated blood loss: None  Indications: 53 year old female presenting late last week with a symptomatic right UPJ stone, 11 mm in size.  She is scheduled for shockwave lithotripsy on Thursday of this week.  Because of persistent pain, she presents at this time for stent placement to alleviate the pain as well as to facilitate passage of stone fragments following her lithotripsy.  I discussed the procedure as well as risks and complications with her.  She understands these and desires to proceed.  Findings: Bladder urothelium appeared normal.  Ureteral orifice ease were normal in configuration location.  Retrograde study of the right ureter with Omnipaque revealed a normal ureter throughout.  There was a filling defect in the normal-sized right renal pelvis.  Pyelocalyceal system was otherwise normal.  Description of procedure: The patient was properly identified and marked in the holding area.  She was taken to the operating room where general anesthetic was administered with the LMA.  She was placed in the dorsolithotomy position.  Genitalia and perineum were prepped and draped.  Proper timeout performed.  21 French panendoscope advanced into the bladder with the above-mentioned findings.  Gentle retrograde pyelogram was performed with Omnipaque with the above-mentioned findings.  Following this, 0.038 inch sensor tip guidewire was advanced through the open-ended catheter into the upper pole calyceal system.  The open-ended catheter was removed, and a 6 [place] by 24 cm contour double-J stent with a tether removed was advanced into the ureter.  It was deployed  once the guidewire was removed with excellent proximal and distal curl seen using fluoroscopy and cystoscopy, respectively.  Bladder was drained, scope removed and the procedure terminated.  The patient was then awakened, taken to the PACU in stable condition.  She tolerated the procedure well.  She will follow up in 3 days for shockwave lithotripsy.

## 2018-12-24 NOTE — Anesthesia Procedure Notes (Signed)
Procedure Name: LMA Insertion Date/Time: 12/24/2018 7:40 AM Performed by: Glory Buff, CRNA Pre-anesthesia Checklist: Patient identified, Emergency Drugs available, Suction available and Patient being monitored Patient Re-evaluated:Patient Re-evaluated prior to induction Oxygen Delivery Method: Circle system utilized Preoxygenation: Pre-oxygenation with 100% oxygen Induction Type: IV induction Ventilation: Mask ventilation without difficulty LMA: LMA inserted LMA Size: 4.0 Number of attempts: 1 Placement Confirmation: positive ETCO2 Tube secured with: Tape Dental Injury: Teeth and Oropharynx as per pre-operative assessment

## 2018-12-24 NOTE — Anesthesia Postprocedure Evaluation (Signed)
Anesthesia Post Note  Patient: [name]  Procedure(s) Performed: CYSTOSCOPY WITH RETROGRADE PYELOGRAM/URETERAL STENT PLACEMENT (Right )     Patient location during evaluation: PACU Anesthesia Type: General Level of consciousness: awake and alert Pain management: pain level controlled Vital Signs Assessment: post-procedure vital signs reviewed and stable Respiratory status: spontaneous breathing, nonlabored ventilation, respiratory function stable and patient connected to nasal cannula oxygen Cardiovascular status: blood pressure returned to baseline and stable Postop Assessment: no apparent nausea or vomiting Anesthetic complications: no    Last Vitals:  Vitals:   12/24/18 0845 12/24/18 0909  BP: 118/69 137/70  Pulse: 64 66  Resp: 14 14  Temp:  36.4 C  SpO2: 96% 96%    Last Pain:  Vitals:   12/24/18 0931  TempSrc:   PainSc: 6                  Emmabelle Fear S

## 2018-12-24 NOTE — Progress Notes (Signed)
Paged pharmacy to complete med rec.

## 2018-12-24 NOTE — Transfer of Care (Signed)
Immediate Anesthesia Transfer of Care Note  Patient: [name]  Procedure(s) Performed: CYSTOSCOPY WITH RETROGRADE PYELOGRAM/URETERAL STENT PLACEMENT (Right )  Patient Location: PACU  Anesthesia Type:General  Level of Consciousness: drowsy, patient cooperative and responds to stimulation  Airway & Oxygen Therapy: Patient Spontanous Breathing and Patient connected to face mask oxygen  Post-op Assessment: Report given to RN and Post -op Vital signs reviewed and stable  Post vital signs: Reviewed and stable  Last Vitals:  Vitals Value Taken Time  BP    Temp    Pulse    Resp    SpO2      Last Pain:  Vitals:   12/24/18 0554  TempSrc: Oral  PainSc: 0-No pain         Complications: No apparent anesthesia complications

## 2018-12-25 ENCOUNTER — Emergency Department (HOSPITAL_COMMUNITY): Payer: BC Managed Care – PPO

## 2018-12-25 ENCOUNTER — Other Ambulatory Visit: Payer: Self-pay

## 2018-12-25 ENCOUNTER — Emergency Department (HOSPITAL_COMMUNITY)
Admission: EM | Admit: 2018-12-25 | Discharge: 2018-12-25 | Disposition: A | Discharge status: Home / Self Care | Payer: BC Managed Care – PPO | Attending: Emergency Medicine | Admitting: Emergency Medicine

## 2018-12-25 ENCOUNTER — Encounter (HOSPITAL_COMMUNITY): Payer: Self-pay | Admitting: Emergency Medicine

## 2018-12-25 ENCOUNTER — Encounter (HOSPITAL_COMMUNITY): Payer: Self-pay | Admitting: General Practice

## 2018-12-25 DIAGNOSIS — E039 Hypothyroidism, unspecified: Secondary | ICD-10-CM | POA: Diagnosis not present

## 2018-12-25 DIAGNOSIS — N201 Calculus of ureter: Secondary | ICD-10-CM | POA: Diagnosis not present

## 2018-12-25 DIAGNOSIS — R1031 Right lower quadrant pain: Secondary | ICD-10-CM | POA: Diagnosis present

## 2018-12-25 LAB — URINALYSIS, ROUTINE W REFLEX MICROSCOPIC
Bilirubin Urine: NEGATIVE
Glucose, UA: NEGATIVE mg/dL
Ketones, ur: NEGATIVE mg/dL
Nitrite: NEGATIVE
Protein, ur: 100 mg/dL — AB
RBC / HPF: >50 RBC/hpf — ABNORMAL HIGH (ref 0–5)
Specific Gravity, Urine: 1.014 (ref 1.005–1.030)
WBC, UA: >50 WBC/hpf — ABNORMAL HIGH (ref 0–5)
pH: 6 (ref 5.0–8.0)

## 2018-12-25 LAB — COMPREHENSIVE METABOLIC PANEL
ALT: 20 U/L (ref 0–44)
AST: 12 U/L — ABNORMAL LOW (ref 15–41)
Albumin: 4.1 g/dL (ref 3.5–5.0)
Alkaline Phosphatase: 68 U/L (ref 38–126)
Anion gap: 6 (ref 5–15)
BUN: 17 mg/dL (ref 6–20)
CO2: 23 mmol/L (ref 22–32)
Calcium: 8.9 mg/dL (ref 8.9–10.3)
Chloride: 107 mmol/L (ref 98–111)
Creatinine, Ser: 0.85 mg/dL (ref 0.44–1.00)
GFR calc Af Amer: >60 mL/min (ref >60)
GFR calc non Af Amer: >60 mL/min (ref >60)
Glucose, Bld: 136 mg/dL — ABNORMAL HIGH (ref 70–99)
Potassium: 4.3 mmol/L (ref 3.5–5.1)
Sodium: 136 mmol/L (ref 135–145)
Total Bilirubin: 0.7 mg/dL (ref 0.3–1.2)
Total Protein: 6.9 g/dL (ref 6.5–8.1)

## 2018-12-25 LAB — CBC
HCT: 44.7 % (ref 36.0–46.0)
Hemoglobin: 14.9 g/dL (ref 12.0–15.0)
MCH: 29.6 pg (ref 26.0–34.0)
MCHC: 33.3 g/dL (ref 30.0–36.0)
MCV: 88.7 fL (ref 80.0–100.0)
Platelets: 341 10*3/uL (ref 150–400)
RBC: 5.04 MIL/uL (ref 3.87–5.11)
RDW: 12.9 % (ref 11.5–15.5)
WBC: 19.9 10*3/uL — ABNORMAL HIGH (ref 4.0–10.5)
nRBC: 0 % (ref 0.0–0.2)

## 2018-12-25 LAB — LIPASE, BLOOD: Lipase: 25 U/L (ref 11–51)

## 2018-12-25 MED ORDER — SODIUM CHLORIDE 0.9% FLUSH
3.0000 mL | Freq: Once | INTRAVENOUS | Status: AC
  Administered 2018-12-25: 3 mL via INTRAVENOUS

## 2018-12-25 MED ORDER — MORPHINE SULFATE (PF) 4 MG/ML IV SOLN
4.0000 mg | Freq: Once | INTRAVENOUS | Status: AC
  Administered 2018-12-25: 4 mg via INTRAVENOUS
  Filled 2018-12-25: qty 1

## 2018-12-25 MED ORDER — SODIUM CHLORIDE 0.9 % IV BOLUS
500.0000 mL | Freq: Once | INTRAVENOUS | Status: AC
  Administered 2018-12-25: 500 mL via INTRAVENOUS

## 2018-12-25 MED ORDER — KETOROLAC TROMETHAMINE 30 MG/ML IJ SOLN
30.0000 mg | Freq: Once | INTRAMUSCULAR | Status: AC
  Administered 2018-12-25: 30 mg via INTRAVENOUS
  Filled 2018-12-25: qty 1

## 2018-12-25 MED ORDER — ONDANSETRON HCL 4 MG/2ML IJ SOLN
4.0000 mg | Freq: Once | INTRAMUSCULAR | Status: AC
  Administered 2018-12-25: 4 mg via INTRAVENOUS
  Filled 2018-12-25: qty 2

## 2018-12-25 NOTE — Discharge Instructions (Signed)
You were seen in the emergency department today with flank pain.  You should keep your plan with the urologist for lithotripsy on Thursday.  You can take the Percocet as needed every 6 hours.  Return to the emergency department immediately with any fevers, confusion.  You can call the urology clinic with any worsening discomfort.

## 2018-12-25 NOTE — ED Notes (Signed)
Patient ambulated to restroom with 1 assist.

## 2018-12-25 NOTE — ED Provider Notes (Signed)
Emergency Department Provider Note   I have reviewed the triage vital signs and the nursing notes.   HISTORY  Chief Complaint Abdominal Pain   HPI [name] is a 53 y.o. female with PMH of right UPJ stone s/p double J stent placement with Dr. Diona Fanti presents to the ED with worsening pain overnight.  Patient states that she is been taking her Percocet at home but needing to take it more frequently.  She feels that her pain has migrated from the right flank and is now mostly suprapubic and intermittently severe.  She denies fevers, chills, vomiting.  No radiation of symptoms or other modifying factors.   Past Medical History:  Diagnosis Date  . Complication of anesthesia    SLOW TO AWAKEN  . Depression   . History of kidney stones   . Migraines   . Sinus infection   . Sleep apnea    MILD NO CPAP NEEDED    Patient Active Problem List   Diagnosis Date Noted  . URI (upper respiratory infection) 12/18/2018  . Chronic right-sided low back pain without sciatica 11/02/2018  . Asymptomatic microscopic hematuria 11/02/2018  . Pure hypertriglyceridemia 08/29/2018  . Hyperglycemia 08/29/2018  . Hypothyroidism due to acquired atrophy of thyroid 05/29/2018  . Morbid obesity with BMI of 40.0-44.9, adult ([place]) 01/18/2018  . Obstructive sleep apnea 04/03/2015  . Routine adult health maintenance 11/13/2013  . Encounter for screening mammogram for breast cancer 11/13/2013  . Migraine headache 05/14/2012  . Severe major depression ([place]) 09/05/2006  . Seasonal and perennial allergic rhinitis 09/05/2006    Past Surgical History:  Procedure Laterality Date  . APPENDECTOMY    . BREAST SURGERY    . CYSTOSCOPY W/ URETERAL STENT PLACEMENT Right 12/24/2018   Procedure: CYSTOSCOPY WITH RETROGRADE PYELOGRAM/URETERAL STENT PLACEMENT;  Surgeon: Franchot Gallo, MD;  Location: [facility];  Service: Urology;  Laterality: Right;  30 MINS  . laporoscopy    . LITHOTRIPSY    . WISDOM TOOTH  EXTRACTION    . WRIST SURGERY Right    CYST REMOVED    Allergies Hydrocodone-homatropine, Sulfa antibiotics, Sulfacetamide sodium, and Nickel  Family History  Problem Relation Age of Onset  . Diabetes Mother   . Alcohol abuse Mother   . Diabetes Father   . Heart disease Father   . Alcohol abuse Father   . Cancer Neg Hx   . Stroke Neg Hx   . Hyperlipidemia Neg Hx   . Hypertension Neg Hx   . Kidney disease Neg Hx   . Arthritis Neg Hx     Social History Social History   Tobacco Use  . Smoking status: Never Smoker  . Smokeless tobacco: Never Used  Substance Use Topics  . Alcohol use: Not Currently  . Drug use: No    Review of Systems  Constitutional: No fever/chills. Eyes: No visual changes. ENT: No sore throat. Cardiovascular: Denies chest pain. Respiratory: Denies shortness of breath. Gastrointestinal: Positive suprapubic abdominal pain. No nausea, no vomiting. No diarrhea. No constipation. Genitourinary: Negative for dysuria. Musculoskeletal: Negative for back pain. Skin: Negative for rash. Neurological: Negative for headaches, focal weakness or numbness.  10-point ROS otherwise negative.  ____________________________________________   PHYSICAL EXAM:  VITAL SIGNS: ED Triage Vitals  Enc Vitals Group     BP 12/25/18 0543 140/84     Pulse Rate 12/25/18 0543 63     Resp 12/25/18 0543 (!) 22     Temp 12/25/18 0543 98 F (36.7 C)  Temp Source 12/25/18 0543 Oral     SpO2 12/25/18 0543 99 %     Weight 12/25/18 0543 207 lb (93.9 kg)     Height 12/25/18 0543 5\' 4"  (1.626 m)   Constitutional: Alert and oriented. Patient appears uncomfortable.  Eyes: Conjunctivae are normal.  Head: Atraumatic. Nose: No congestion/rhinnorhea. Mouth/Throat: Mucous membranes are moist.  Neck: No stridor.  Cardiovascular: Normal rate, regular rhythm. Good peripheral circulation. Grossly normal heart sounds.   Respiratory: Normal respiratory effort.  No retractions. Lungs  CTAB. Gastrointestinal: Soft and nontender. No distention.  Musculoskeletal: No gross deformities of extremities. Neurologic:  Normal speech and language. Skin:  Skin is warm, dry and intact. No rash noted.   ____________________________________________   LABS (all labs ordered are listed, but only abnormal results are displayed)  Labs Reviewed  COMPREHENSIVE METABOLIC PANEL - Abnormal; Notable for the following components:      Result Value   Glucose, Bld 136 (*)    AST 12 (*)    All other components within normal limits  CBC - Abnormal; Notable for the following components:   WBC 19.9 (*)    All other components within normal limits  URINALYSIS, ROUTINE W REFLEX MICROSCOPIC - Abnormal; Notable for the following components:   APPearance HAZY (*)    Hgb urine dipstick LARGE (*)    Protein, ur 100 (*)    Leukocytes,Ua MODERATE (*)    RBC / HPF >50 (*)    WBC, UA >50 (*)    Bacteria, UA RARE (*)    All other components within normal limits  LIPASE, BLOOD   ____________________________________________  RADIOLOGY  Dg Abdomen 1 View  Result Date: 12/25/2018 CLINICAL DATA:  Abdominal pain with double-J stent placed on right EXAM: ABDOMEN - 1 VIEW COMPARISON:  CT abdomen and pelvis December 20, 2018 FINDINGS: Double-J stent extends from the level of L2 on the right to the bladder. There is a calculus at the L2-3 level on the right measuring 1.5 x 1.1 cm. No other abnormal calcifications are evident. There is an intrauterine device in the mid-pelvis. There is moderate stool throughout the colon. There is no bowel dilatation or air-fluid level to suggest bowel obstruction. No free air. Lung bases clear. IMPRESSION: Double-J stent on the right with the proximal aspect of the stent slightly superior to a 1.5 x 1.1 cm calculus which on recent CT was shown at the right ureteropelvic junction. No new calcifications evident. No bowel obstruction or free air.  Lung bases clear. Intrauterine  device in mid pelvis. Electronically Signed   By: Bretta BangWilliam  Woodruff III M.D.   On: 12/25/2018 08:11    ____________________________________________   PROCEDURES  Procedure(s) performed:   Procedures  None  ____________________________________________   INITIAL IMPRESSION / ASSESSMENT AND PLAN / ED COURSE  Pertinent labs & imaging results that were available during my care of the patient were reviewed by me and considered in my medical decision making (see chart for details).   Patient presents to the emergency department with worsening abdominal and flank pain after stent placement.  She feels she is pain has moved more now to the suprapubic area.  We will reach out with urology to decide on further imaging.   Spoke with Dr. Alvester MorinBell who advises KUB for assessment of stone location. Also advises Toradol which was ordered. Patient is due for lithotripsy on Thhursday so could receive Toradol ASAP this AM. Discussed with nursing to give quickly so as not to delay procedure. KUB  reviewed along with UA which does not show evidence of infection.   08:52 AM  Patient feeling improved after Toradol given earlier.  This dose was given early enough to where it will not interfere with her procedure on Thursday.  Patient to call the urology office with worsening pain.  Discussed ED return precautions in detail.  Patient is calling for a ride home. KUB reviewed along with labs.  ____________________________________________  FINAL CLINICAL IMPRESSION(S) / ED DIAGNOSES  Final diagnoses:  Ureterolithiasis     MEDICATIONS GIVEN DURING THIS VISIT:  Medications  sodium chloride flush (NS) 0.9 % injection 3 mL (3 mLs Intravenous Given 12/25/18 0719)  morphine 4 MG/ML injection 4 mg (4 mg Intravenous Given 12/25/18 0713)  ondansetron (ZOFRAN) injection 4 mg (4 mg Intravenous Given 12/25/18 0713)  sodium chloride 0.9 % bolus 500 mL (0 mLs Intravenous Stopped 12/25/18 0826)  ketorolac (TORADOL) 30  MG/ML injection 30 mg (30 mg Intravenous Given 12/25/18 0747)    Note:  This document was prepared using Dragon voice recognition software and may include unintentional dictation errors.  Alona Bene, MD, [facility]    Long, Arlyss Repress, MD 12/25/18 [phone]

## 2018-12-25 NOTE — ED Triage Notes (Signed)
Patient complaining of lower abdominal pain. Patient states she got a stent put in yesterday. She says she has been taking her pain medication and it is not working.

## 2018-12-27 ENCOUNTER — Encounter (HOSPITAL_COMMUNITY): Payer: Self-pay | Admitting: *Deleted

## 2018-12-27 ENCOUNTER — Ambulatory Visit (HOSPITAL_COMMUNITY): Payer: BC Managed Care – PPO

## 2018-12-27 ENCOUNTER — Other Ambulatory Visit: Payer: Self-pay

## 2018-12-27 ENCOUNTER — Ambulatory Visit (HOSPITAL_COMMUNITY)
Admission: RE | Admit: 2018-12-27 | Discharge: 2018-12-27 | Disposition: A | Discharge status: Home / Self Care | Payer: BC Managed Care – PPO | Attending: Urology | Admitting: Urology

## 2018-12-27 ENCOUNTER — Encounter (HOSPITAL_COMMUNITY)
Admission: RE | Disposition: A | Discharge status: Home / Self Care | Payer: Self-pay | Source: Home / Self Care | Attending: Urology

## 2018-12-27 DIAGNOSIS — N200 Calculus of kidney: Secondary | ICD-10-CM | POA: Insufficient documentation

## 2018-12-27 DIAGNOSIS — Z6835 Body mass index (BMI) 35.0-35.9, adult: Secondary | ICD-10-CM | POA: Diagnosis not present

## 2018-12-27 DIAGNOSIS — Z882 Allergy status to sulfonamides status: Secondary | ICD-10-CM | POA: Diagnosis not present

## 2018-12-27 DIAGNOSIS — E669 Obesity, unspecified: Secondary | ICD-10-CM | POA: Diagnosis not present

## 2018-12-27 DIAGNOSIS — Z791 Long term (current) use of non-steroidal anti-inflammatories (NSAID): Secondary | ICD-10-CM | POA: Insufficient documentation

## 2018-12-27 DIAGNOSIS — Z885 Allergy status to narcotic agent status: Secondary | ICD-10-CM | POA: Insufficient documentation

## 2018-12-27 DIAGNOSIS — N202 Calculus of kidney with calculus of ureter: Secondary | ICD-10-CM | POA: Diagnosis present

## 2018-12-27 DIAGNOSIS — Z79899 Other long term (current) drug therapy: Secondary | ICD-10-CM | POA: Diagnosis not present

## 2018-12-27 DIAGNOSIS — Z8249 Family history of ischemic heart disease and other diseases of the circulatory system: Secondary | ICD-10-CM | POA: Diagnosis not present

## 2018-12-27 DIAGNOSIS — Z833 Family history of diabetes mellitus: Secondary | ICD-10-CM | POA: Diagnosis not present

## 2018-12-27 DIAGNOSIS — G4730 Sleep apnea, unspecified: Secondary | ICD-10-CM | POA: Insufficient documentation

## 2018-12-27 DIAGNOSIS — Z9109 Other allergy status, other than to drugs and biological substances: Secondary | ICD-10-CM | POA: Insufficient documentation

## 2018-12-27 HISTORY — PX: EXTRACORPOREAL SHOCK WAVE LITHOTRIPSY: SHX1557

## 2018-12-27 SURGERY — LITHOTRIPSY, ESWL
Anesthesia: LOCAL | Laterality: Right

## 2018-12-27 MED ORDER — TAMSULOSIN HCL 0.4 MG PO CAPS: 0.4000 mg | ORAL_CAPSULE | Freq: Every day | ORAL | 0 refills | Status: DC

## 2018-12-27 MED ORDER — CIPROFLOXACIN HCL 500 MG PO TABS
500.0000 mg | ORAL_TABLET | ORAL | Status: AC
  Administered 2018-12-27: 500 mg via ORAL
  Filled 2018-12-27: qty 1

## 2018-12-27 MED ORDER — DIPHENHYDRAMINE HCL 25 MG PO CAPS
25.0000 mg | ORAL_CAPSULE | ORAL | Status: AC
  Administered 2018-12-27: 25 mg via ORAL
  Filled 2018-12-27: qty 1

## 2018-12-27 MED ORDER — OXYCODONE HCL 5 MG PO TABS: 5.0000 mg | ORAL_TABLET | ORAL | Status: DC | PRN

## 2018-12-27 MED ORDER — OXYCODONE-ACETAMINOPHEN 5-325 MG PO TABS: 1.0000 | ORAL_TABLET | Freq: Three times a day (TID) | ORAL | 0 refills | Status: DC | PRN

## 2018-12-27 MED ORDER — SODIUM CHLORIDE 0.9 % IV SOLN
INTRAVENOUS | Status: DC
  Administered 2018-12-27: 08:00:00 via INTRAVENOUS

## 2018-12-27 MED ORDER — DIAZEPAM 5 MG PO TABS
10.0000 mg | ORAL_TABLET | ORAL | Status: AC
  Administered 2018-12-27: 10 mg via ORAL
  Filled 2018-12-27: qty 2

## 2018-12-27 NOTE — H&P (Signed)
I have kidney stones.  HPI: [name] is a 53 year-old female patient who was referred by Dr. Etta Grandchild, MD who is here for renal calculi.  The problem is on the right side. She first stated noticing pain on approximately 12/18/2018. This is not her first kidney stone. Her first stone was 12/20/2005. She has had 1 stones prior to getting this one. She is currently having flank pain, back pain, nausea, and vomiting. She denies having groin pain, fever, and chills. She has not caught a stone in her urine strainer since her symptoms began.   11.13.2020: Referred today by Dr Yetta Barre for a possible KS. She presented to the ER yesterday with severe pain and vomiting -- she had a CT scan that found an 11-12 mm stone in the right ureter. She had one stone prior (2007) treated with lithotripsy -- her stone fragments ended up creating a blockage that needed a secondary procedure. She would like to avoid this happening again if possible. She states has interstitial cystitis show she is unable to report confidently whether she has bladder infections frequently. She was given percocet for her pain at the ER.      ALLERGIES: Hydrocodine Nickel Sulfa Drugs    MEDICATIONS: Levothyroxine Sodium  Alka-Seltzer Original  Atarax 50 mg tablet Oral  Cefdinir  Detrol La 4 mg capsule, ext release 24 hr 1 Oral Daily  Elmiron 100 MG Oral Capsule 0 Oral Twice daily  Hydroxyzine Hcl 25 mg tablet 0 Oral  Maxalt 10 mg tablet Oral  Meloxicam  Mirena  Mirtazapine  Oxycodone-Acetaminophen 5 mg-325 mg tablet  Prozac 20 mg capsule Oral  Riboflavin  Saxenda  Tylenol 325 mg tablet Oral  Zofran     GU PSH: Remove Kidney Stone - 2007       PSH Notes: Oral Surgery Tooth Extraction, Appendectomy, Lithotomy   NON-GU PSH: Appendectomy - 2007 Dental Surgery Procedure - 2007 Tonsillectomy.. Wrist Arthroscopy/surgery     GU PMH: Dysuria, Dysuria - 2014 Endometriosis, Unspec, Endometriosis - 2014 Nocturia,  Nocturia - 2014 Renal calculus, Nephrolithiasis - 2014 Urinary Tract Inf, Unspec site, Urinary tract infection - 2014      PMH Notes:  1898-02-07 00:00:00 - Note: Normal Routine History And Physical Adult  2006-01-19 13:07:18 - Note: Blood In The Urine Color   NON-GU PMH: Personal history of other mental and behavioral disorders, History of depression - 2014 Personal history of other specified conditions, History of heartburn - 2014 Arthritis Depression Sleep Apnea    FAMILY HISTORY: Diabetes - Father, Mother Heart Disease - Father   SOCIAL HISTORY: Marital Status: Divorced Preferred Language: English; Ethnicity: Not Hispanic Or Latino; Race: White Current Smoking Status: Patient has never smoked.   Tobacco Use Assessment Completed: Used Tobacco in last 30 days? Drinks 1 drink per year.  Does not drink caffeine. Patient's occupation IT consultant..     Notes: Occupation:, Alcohol Use, Tobacco Use, Marital History - Currently Married, Caffeine Use   REVIEW OF SYSTEMS:    GU Review Female:   Patient reports frequent urination, hard to postpone urination, get up at night to urinate, and leakage of urine. Patient denies burning /pain with urination, stream starts and stops, trouble starting your stream, have to strain to urinate, and being pregnant.  Gastrointestinal (Upper):   Patient reports nausea, vomiting, and indigestion/ heartburn.   Gastrointestinal (Lower):   Patient denies diarrhea and constipation.  Constitutional:   Patient reports weight loss and fatigue. Patient denies  fever and night sweats.  Skin:   Patient reports itching. Patient denies skin rash/ lesion.  Eyes:   Patient denies blurred vision and double vision.  Ears/ Nose/ Throat:   Patient reports sinus problems. Patient denies sore throat.  Hematologic/Lymphatic:   Patient reports easy bruising. Patient denies swollen glands.  Cardiovascular:   Patient denies leg swelling and chest pains.   Respiratory:   Patient reports cough. Patient denies shortness of breath.  Endocrine:   Patient denies excessive thirst.  Musculoskeletal:   Patient reports back pain. Patient denies joint pain.  Neurological:   Patient denies headaches and dizziness.  Psychologic:   Patient reports depression. Patient denies anxiety.   Notes: Hematuria    VITAL SIGNS:      12/21/2018 11:02 AM  Weight 208 lb / 94.35 kg  Height 64 in / 162.56 cm  BP 113/75 mmHg  Pulse 75 /min  Temperature 98.4 F / 36.8 C  BMI 35.7 kg/m   MULTI-SYSTEM PHYSICAL EXAMINATION:    Constitutional: Well-nourished. No physical deformities. Normally developed. Good grooming.  Neck: Neck symmetrical, not swollen. Normal tracheal position.  Respiratory: No labored breathing, no use of accessory muscles.   Skin: No paleness, no jaundice, no cyanosis. No lesion, no ulcer, no rash.  Neurologic / Psychiatric: Oriented to time, oriented to place, oriented to person. No depression, no anxiety, no agitation.  Eyes: Normal conjunctivae. Normal eyelids.  Ears, Nose, Mouth, and Throat: Left ear no scars, no lesions, no masses. Right ear no scars, no lesions, no masses. Nose no scars, no lesions, no masses. Normal hearing. Normal lips.  Musculoskeletal: Normal gait and station of head and neck.     PAST DATA REVIEWED:  Source Of History:  Patient   PROCEDURES: None   ASSESSMENT:      ICD-10 Details  1 GU:   Ureteral calculus - N20.1 Right, CT yesterday showed right ureteral stone (11-12 mm) that is not able to pass. She will need surgical intervention soon for this.    PLAN:            Medications New Meds: Keflex 500 mg capsule 1 capsule PO Q 12 H   #10  0 Refill(s)    Stop Meds: Atarax 50 mg tablet Oral  Start: 01/22/2006  Discontinue: 12/21/2018  - Reason: The medication cycle was completed.  Detrol La 4 mg capsule, ext release 24 hr 1 Oral Daily  Start: 08/03/2006  Discontinue: 12/21/2018  - Reason: The medication cycle  was completed.  Elmiron 100 MG Oral Capsule 0 Oral Twice daily  Start: 08/03/2006  Discontinue: 12/21/2018  - Reason: The medication cycle was completed.  Hydroxyzine Hcl 25 mg tablet 0 Oral  Start: 08/03/2006  Discontinue: 12/21/2018  - Reason: The medication cycle was completed.  Maxalt 10 mg tablet Oral  Start: 08/03/2006  Discontinue: 12/21/2018  - Reason: The medication cycle was completed.  Prozac 20 mg capsule Oral  Start: 08/03/2006  Discontinue: 12/21/2018  - Reason: The medication cycle was completed.  Tylenol 325 mg tablet Oral  Start: 01/22/2006  Discontinue: 12/21/2018  - Reason: The medication cycle was completed.            Document Letter(s):  Created for Patient: Clinical Summary         Notes:   1. Discussed treatment options for her stone. She would to avoid repeat litho given her previous complication, but she was assured that stent placement could reduce risk of repeat blockage.   2. Stent  placement monday morning and follow-up shockwave litho at later day.

## 2018-12-27 NOTE — Op Note (Signed)
See [facility] OP note scanned into chart. Also because of the size, density, location and other factors that cannot be anticipated I feel this will likely be a staged procedure. This fact supersedes any indication in the scanned [facility] operative note to the contrary.  

## 2018-12-28 ENCOUNTER — Encounter (HOSPITAL_COMMUNITY): Payer: Self-pay | Admitting: Urology

## 2019-01-02 ENCOUNTER — Ambulatory Visit: Payer: BC Managed Care – PPO

## 2019-02-14 ENCOUNTER — Encounter: Payer: Self-pay | Admitting: Internal Medicine

## 2019-02-21 ENCOUNTER — Telehealth: Payer: Self-pay

## 2019-02-21 NOTE — Telephone Encounter (Signed)
Copied from CRM [id number]. Topic: General - Inquiry >> Feb 21, 2019 10:57 AM Deborha Payment wrote: Reason for CRM: Patient is requesting a nurse to call back regarding her medication Saxenda.  Patient states she is not sure how PCP wants her to take medication.  Patient is wondering if she needs an appt for this medication Call back [phone]

## 2019-02-21 NOTE — Telephone Encounter (Signed)
Start the low dose and titrate up

## 2019-02-21 NOTE — Telephone Encounter (Signed)
Pt is just now able to start the Saxenda that was prescribed in April of 2020. Should she see you first before restarting it or should she do the weekly dose titration up.

## 2019-02-25 NOTE — Telephone Encounter (Signed)
Lvm for pt informing that I am sending the dosing schedule for the Saxenda.   Titration schedule below.  0.6mg  for 1 week 1.2mg  for 1 week 1.8mg  for 1 week 2.4mg  for 1 week 3.0mg  for 1 week

## 2019-03-11 ENCOUNTER — Other Ambulatory Visit: Payer: Self-pay | Admitting: Urology

## 2019-03-20 ENCOUNTER — Other Ambulatory Visit: Payer: Self-pay | Admitting: Urology

## 2019-03-22 ENCOUNTER — Other Ambulatory Visit: Payer: Self-pay

## 2019-03-22 ENCOUNTER — Encounter (HOSPITAL_BASED_OUTPATIENT_CLINIC_OR_DEPARTMENT_OTHER): Payer: Self-pay | Admitting: Urology

## 2019-03-22 NOTE — Progress Notes (Signed)
Spoke w/ via phone for pre-op interview--- PT Lab needs dos---- none              Lab results------ no COVID test ------ 03-26-2019 @ 0800 Arrive at ------- 0945 NPO after ------ MN Medications to take morning of surgery ----- Synthroid w/ sips of water and if needed may take dilaudid, zofran, imitrex, oxybutynin Diabetic medication ----- n/a Patient Special Instructions ----- n/a Pre-Op special Istructions ----- n/a Patient verbalized understanding of instructions that were given at this phone interview. Patient denies shortness of breath, chest pain, fever, cough a this phone interview.

## 2019-03-26 ENCOUNTER — Other Ambulatory Visit (HOSPITAL_COMMUNITY)
Admission: RE | Admit: 2019-03-26 | Discharge: 2019-03-26 | Disposition: A | Discharge status: Home / Self Care | Payer: BC Managed Care – PPO | Source: Ambulatory Visit | Attending: Urology | Admitting: Urology

## 2019-03-26 DIAGNOSIS — Z01812 Encounter for preprocedural laboratory examination: Secondary | ICD-10-CM | POA: Diagnosis not present

## 2019-03-26 DIAGNOSIS — Z20822 Contact with and (suspected) exposure to covid-19: Secondary | ICD-10-CM | POA: Insufficient documentation

## 2019-03-26 LAB — SARS CORONAVIRUS 2 (TAT 6-24 HRS): SARS Coronavirus 2: NEGATIVE

## 2019-03-27 ENCOUNTER — Other Ambulatory Visit (HOSPITAL_COMMUNITY)
Admission: RE | Admit: 2019-03-27 | Discharge: 2019-03-27 | Disposition: A | Discharge status: Home / Self Care | Payer: BC Managed Care – PPO | Source: Ambulatory Visit | Attending: Urology | Admitting: Urology

## 2019-03-27 DIAGNOSIS — Z20822 Contact with and (suspected) exposure to covid-19: Secondary | ICD-10-CM | POA: Diagnosis not present

## 2019-03-27 DIAGNOSIS — Z01812 Encounter for preprocedural laboratory examination: Secondary | ICD-10-CM | POA: Diagnosis not present

## 2019-03-27 LAB — SARS CORONAVIRUS 2 (TAT 6-24 HRS): SARS Coronavirus 2: NEGATIVE

## 2019-03-27 NOTE — Progress Notes (Signed)
Pt called today via phone and stated her power at home went out and she went inside a restaurant sat down and ate. Pt realizes she has broken quarantine having covid test done yesterday , she is inquiring what to do.   Called and spoke w/ Remo Lipps AD @[facility] , stated pt will need to retest today.  Called pt back and pt verbalized understanding to retest today, appointment made for 1440, advised pt let her family go to and inside business's for her and they are to follow the 3 W's.  Pt asked about her dos 03-29-2019 if unable to make it in ,  Advised pt to call [facility] front desk [phone], open at 0530, and let them know and also she needs to call office to reschedule.

## 2019-03-29 ENCOUNTER — Ambulatory Visit (HOSPITAL_BASED_OUTPATIENT_CLINIC_OR_DEPARTMENT_OTHER): Payer: BC Managed Care – PPO | Admitting: Anesthesiology

## 2019-03-29 ENCOUNTER — Encounter (HOSPITAL_BASED_OUTPATIENT_CLINIC_OR_DEPARTMENT_OTHER): Payer: Self-pay | Admitting: Urology

## 2019-03-29 ENCOUNTER — Ambulatory Visit (HOSPITAL_BASED_OUTPATIENT_CLINIC_OR_DEPARTMENT_OTHER)
Admission: RE | Admit: 2019-03-29 | Discharge: 2019-03-29 | Disposition: A | Discharge status: Home / Self Care | Payer: BC Managed Care – PPO | Attending: Urology | Admitting: Urology

## 2019-03-29 ENCOUNTER — Other Ambulatory Visit: Payer: Self-pay

## 2019-03-29 ENCOUNTER — Encounter (HOSPITAL_BASED_OUTPATIENT_CLINIC_OR_DEPARTMENT_OTHER)
Admission: RE | Disposition: A | Discharge status: Home / Self Care | Payer: Self-pay | Source: Home / Self Care | Attending: Urology

## 2019-03-29 DIAGNOSIS — Z881 Allergy status to other antibiotic agents status: Secondary | ICD-10-CM | POA: Insufficient documentation

## 2019-03-29 DIAGNOSIS — N201 Calculus of ureter: Secondary | ICD-10-CM | POA: Diagnosis not present

## 2019-03-29 DIAGNOSIS — E039 Hypothyroidism, unspecified: Secondary | ICD-10-CM | POA: Diagnosis not present

## 2019-03-29 DIAGNOSIS — G4730 Sleep apnea, unspecified: Secondary | ICD-10-CM | POA: Diagnosis not present

## 2019-03-29 DIAGNOSIS — Z885 Allergy status to narcotic agent status: Secondary | ICD-10-CM | POA: Diagnosis not present

## 2019-03-29 DIAGNOSIS — M1990 Unspecified osteoarthritis, unspecified site: Secondary | ICD-10-CM | POA: Diagnosis not present

## 2019-03-29 DIAGNOSIS — Z87442 Personal history of urinary calculi: Secondary | ICD-10-CM | POA: Diagnosis not present

## 2019-03-29 DIAGNOSIS — Z882 Allergy status to sulfonamides status: Secondary | ICD-10-CM | POA: Diagnosis not present

## 2019-03-29 HISTORY — DX: Nocturia: R35.1

## 2019-03-29 HISTORY — DX: Other seasonal allergic rhinitis: J30.2

## 2019-03-29 HISTORY — DX: Other specified postprocedural states: Z98.890

## 2019-03-29 HISTORY — DX: Frequency of micturition: R35.0

## 2019-03-29 HISTORY — DX: Calculus of ureter: N20.1

## 2019-03-29 HISTORY — DX: Unspecified osteoarthritis, unspecified site: M19.90

## 2019-03-29 HISTORY — DX: Hypothyroidism, unspecified: E03.9

## 2019-03-29 HISTORY — DX: Dermatitis, unspecified: L30.9

## 2019-03-29 HISTORY — DX: Sleep apnea, unspecified: G47.30

## 2019-03-29 HISTORY — PX: CYSTOSCOPY WITH RETROGRADE PYELOGRAM, URETEROSCOPY AND STENT PLACEMENT: SHX5789

## 2019-03-29 HISTORY — DX: Nausea with vomiting, unspecified: R11.2

## 2019-03-29 SURGERY — CYSTOURETEROSCOPY, WITH RETROGRADE PYELOGRAM AND STENT INSERTION
Anesthesia: General | Site: Ureter | Laterality: Right

## 2019-03-29 MED ORDER — DIPHENHYDRAMINE HCL 50 MG/ML IJ SOLN
6.2500 mg | Freq: Four times a day (QID) | INTRAMUSCULAR | Status: DC | PRN
  Administered 2019-03-29: 6.5 mg via INTRAVENOUS
  Filled 2019-03-29: qty 0.13

## 2019-03-29 MED ORDER — MIDAZOLAM HCL 2 MG/2ML IJ SOLN
INTRAMUSCULAR | Status: AC
  Filled 2019-03-29: qty 2

## 2019-03-29 MED ORDER — FENTANYL CITRATE (PF) 100 MCG/2ML IJ SOLN
INTRAMUSCULAR | Status: AC
  Filled 2019-03-29: qty 2

## 2019-03-29 MED ORDER — DEXAMETHASONE SODIUM PHOSPHATE 10 MG/ML IJ SOLN
INTRAMUSCULAR | Status: DC | PRN
  Administered 2019-03-29: 10 mg via INTRAVENOUS

## 2019-03-29 MED ORDER — PROPOFOL 10 MG/ML IV BOLUS
INTRAVENOUS | Status: DC | PRN
  Administered 2019-03-29: 160 mg via INTRAVENOUS

## 2019-03-29 MED ORDER — SODIUM CHLORIDE 0.9 % IR SOLN
Status: DC | PRN
  Administered 2019-03-29: 3000 mL

## 2019-03-29 MED ORDER — NITROFURANTOIN MONOHYD MACRO 100 MG PO CAPS: 100.0000 mg | ORAL_CAPSULE | Freq: Two times a day (BID) | ORAL | 0 refills | Status: DC

## 2019-03-29 MED ORDER — OXYCODONE HCL 5 MG PO TABS
5.0000 mg | ORAL_TABLET | Freq: Once | ORAL | Status: AC | PRN
  Administered 2019-03-29: 5 mg via ORAL
  Filled 2019-03-29: qty 1

## 2019-03-29 MED ORDER — FENTANYL CITRATE (PF) 100 MCG/2ML IJ SOLN
25.0000 ug | INTRAMUSCULAR | Status: DC | PRN
  Administered 2019-03-29: 25 ug via INTRAVENOUS
  Filled 2019-03-29: qty 1

## 2019-03-29 MED ORDER — SCOPOLAMINE 1 MG/3DAYS TD PT72
1.0000 | MEDICATED_PATCH | TRANSDERMAL | Status: DC
  Administered 2019-03-29: 1.5 mg via TRANSDERMAL
  Filled 2019-03-29: qty 1

## 2019-03-29 MED ORDER — ACETAMINOPHEN 500 MG PO TABS
1000.0000 mg | ORAL_TABLET | Freq: Once | ORAL | Status: DC | PRN
  Filled 2019-03-29: qty 2

## 2019-03-29 MED ORDER — ACETAMINOPHEN 10 MG/ML IV SOLN
1000.0000 mg | Freq: Once | INTRAVENOUS | Status: DC | PRN
  Filled 2019-03-29: qty 100

## 2019-03-29 MED ORDER — DIPHENHYDRAMINE HCL 50 MG/ML IJ SOLN
INTRAMUSCULAR | Status: AC
  Filled 2019-03-29: qty 1

## 2019-03-29 MED ORDER — SCOPOLAMINE 1 MG/3DAYS TD PT72
MEDICATED_PATCH | TRANSDERMAL | Status: AC
  Filled 2019-03-29: qty 1

## 2019-03-29 MED ORDER — OXYCODONE HCL 5 MG/5ML PO SOLN
5.0000 mg | Freq: Once | ORAL | Status: AC | PRN
  Filled 2019-03-29: qty 5

## 2019-03-29 MED ORDER — OXYCODONE HCL 5 MG PO TABS
ORAL_TABLET | ORAL | Status: AC
  Filled 2019-03-29: qty 1

## 2019-03-29 MED ORDER — LACTATED RINGERS IV SOLN
INTRAVENOUS | Status: DC
  Filled 2019-03-29: qty 1000

## 2019-03-29 MED ORDER — LIDOCAINE 2% (20 MG/ML) 5 ML SYRINGE
INTRAMUSCULAR | Status: DC | PRN
  Administered 2019-03-29: 60 mg via INTRAVENOUS

## 2019-03-29 MED ORDER — FENTANYL CITRATE (PF) 100 MCG/2ML IJ SOLN
INTRAMUSCULAR | Status: DC | PRN
  Administered 2019-03-29 (×2): 50 ug via INTRAVENOUS

## 2019-03-29 MED ORDER — ONDANSETRON HCL 4 MG/2ML IJ SOLN
INTRAMUSCULAR | Status: DC | PRN
  Administered 2019-03-29: 4 mg via INTRAVENOUS

## 2019-03-29 MED ORDER — KETOROLAC TROMETHAMINE 30 MG/ML IJ SOLN
INTRAMUSCULAR | Status: DC | PRN
  Administered 2019-03-29: 30 mg via INTRAVENOUS

## 2019-03-29 MED ORDER — MIDAZOLAM HCL 2 MG/2ML IJ SOLN
INTRAMUSCULAR | Status: DC | PRN
  Administered 2019-03-29: 2 mg via INTRAVENOUS

## 2019-03-29 MED ORDER — ACETAMINOPHEN 160 MG/5ML PO SOLN
1000.0000 mg | Freq: Once | ORAL | Status: DC | PRN
  Filled 2019-03-29: qty 40.6

## 2019-03-29 MED ORDER — CEFAZOLIN SODIUM-DEXTROSE 2-4 GM/100ML-% IV SOLN
INTRAVENOUS | Status: AC
  Filled 2019-03-29: qty 100

## 2019-03-29 MED ORDER — IOHEXOL 300 MG/ML  SOLN
INTRAMUSCULAR | Status: DC | PRN
  Administered 2019-03-29: 10 mL

## 2019-03-29 MED ORDER — CEFAZOLIN SODIUM-DEXTROSE 2-4 GM/100ML-% IV SOLN
2.0000 g | INTRAVENOUS | Status: AC
  Administered 2019-03-29: 2 g via INTRAVENOUS
  Filled 2019-03-29: qty 100

## 2019-03-29 SURGICAL SUPPLY — 25 items
BAG DRAIN URO-CYSTO SKYTR STRL (DRAIN) ×4 IMPLANT
BASKET ZERO TIP NITINOL 2.4FR (BASKET) IMPLANT
CATH INTERMIT  6FR 70CM (CATHETERS) ×4 IMPLANT
CLOTH BEACON ORANGE TIMEOUT ST (SAFETY) ×4 IMPLANT
ELECT REM PT RETURN 9FT ADLT (ELECTROSURGICAL)
ELECTRODE REM PT RTRN 9FT ADLT (ELECTROSURGICAL) IMPLANT
FIBER LASER FLEXIVA 200 (UROLOGICAL SUPPLIES) IMPLANT
FIBER LASER FLEXIVA 365 (UROLOGICAL SUPPLIES) IMPLANT
FIBER LASER TRAC TIP (UROLOGICAL SUPPLIES) IMPLANT
GLOVE BIO SURGEON STRL SZ8 (GLOVE) ×4 IMPLANT
GOWN STRL REUS W/ TWL XL LVL3 (GOWN DISPOSABLE) ×2 IMPLANT
GOWN STRL REUS W/TWL XL LVL3 (GOWN DISPOSABLE) ×2
GUIDEWIRE ANG ZIPWIRE 038X150 (WIRE) ×4 IMPLANT
GUIDEWIRE STR DUAL SENSOR (WIRE) ×4 IMPLANT
IV NS IRRIG 3000ML ARTHROMATIC (IV SOLUTION) ×8 IMPLANT
KIT TURNOVER CYSTO (KITS) ×4 IMPLANT
MANIFOLD NEPTUNE II (INSTRUMENTS) ×4 IMPLANT
NS IRRIG 500ML POUR BTL (IV SOLUTION) ×4 IMPLANT
PACK CYSTO (CUSTOM PROCEDURE TRAY) ×4 IMPLANT
SHEATH URETERAL 12FRX28CM (UROLOGICAL SUPPLIES) ×4 IMPLANT
SHEATH URETERAL 12FRX35CM (MISCELLANEOUS) IMPLANT
STENT URET 6FRX24 CONTOUR (STENTS) ×4 IMPLANT
TUBE CONNECTING 12'X1/4 (SUCTIONS) ×1
TUBE CONNECTING 12X1/4 (SUCTIONS) ×3 IMPLANT
TUBING UROLOGY SET (TUBING) ×4 IMPLANT

## 2019-03-29 NOTE — Anesthesia Procedure Notes (Signed)
Procedure Name: LMA Insertion Date/Time: 03/29/2019 11:56 AM Performed by: Minerva Ends, CRNA Pre-anesthesia Checklist: Patient identified, Emergency Drugs available, Suction available and Patient being monitored Patient Re-evaluated:Patient Re-evaluated prior to induction Oxygen Delivery Method: Circle System Utilized Preoxygenation: Pre-oxygenation with 100% oxygen Induction Type: IV induction Ventilation: Mask ventilation without difficulty LMA: LMA inserted LMA Size: 4.0 Number of attempts: 1 Placement Confirmation: positive ETCO2 Tube secured with: Tape Dental Injury: Teeth and Oropharynx as per pre-operative assessment  Comments: Smooth  Induction moser-- lma insertion AM CRNA atraumatic-- bilat BS

## 2019-03-29 NOTE — Transfer of Care (Signed)
Immediate Anesthesia Transfer of Care Note  Patient: [name]  Procedure(s) Performed: CYSTOSCOPY WITH RETROGRADE PYELOGRAM, URETEROSCOPY AND STENT PLACEMENT (Right Ureter) HOLMIUM LASER APPLICATION (Right Ureter)  Patient Location: PACU  Anesthesia Type:General  Level of Consciousness: awake and alert   Airway & Oxygen Therapy: Patient Spontanous Breathing and Patient connected to face mask oxygen  Post-op Assessment: Report given to RN and Post -op Vital signs reviewed and stable  Post vital signs: Reviewed and stable  Last Vitals:  Vitals Value Taken Time  BP 152/84 03/29/19 1243  Temp    Pulse 87 03/29/19 1244  Resp 18 03/29/19 1244  SpO2 97 % 03/29/19 1244  Vitals shown include unvalidated device data.  Last Pain:  Vitals:   03/29/19 1029  TempSrc: Oral  PainSc: 0-No pain      Patients Stated Pain Goal: 4 (03/29/19 1029)  Complications: No apparent anesthesia complications

## 2019-03-29 NOTE — H&P (Signed)
H&P  Chief Complaint: Persistent right ureteral stone(s)  History of Present Illness: [name] is a 54 y.o. year old female whho presents for URS mgmt of persistent right ureteral calculi. She underwent urgent rt ureteral stenting 11.16.2020 for symptomatic rt renal pelvic stone. ESL followed 3 days later w/ excellent fragmentation. She has passed most of her stone burden, but has remaining stones in upper ureter following stent removal.  Past Medical History:  Diagnosis Date  . Complication of anesthesia    SLOW TO AWAKE  . Depression   . Eczema   . Frequency of urination   . History of kidney stones   . Hypothyroidism    followed by pcp  . Migraines   . Mild sleep apnea    per pt had study in epic 06-13-2015 no cpap recommendation  . Nocturia   . OA (osteoarthritis)    low back  . Right ureteral calculus   . Seasonal and perennial allergic rhinitis     Past Surgical History:  Procedure Laterality Date  . APPENDECTOMY    . CYSTOSCOPY W/ URETERAL STENT PLACEMENT Right 12/24/2018   Procedure: CYSTOSCOPY WITH RETROGRADE PYELOGRAM/URETERAL STENT PLACEMENT;  Surgeon: Marcine Matar, MD;  Location: [facility];  Service: Urology;  Laterality: Right;  30 MINS  . EXTRACORPOREAL SHOCK WAVE LITHOTRIPSY Right 12/27/2018   Procedure: EXTRACORPOREAL SHOCK WAVE LITHOTRIPSY (ESWL);  Surgeon: Crist Fat, MD;  Location: [facility];  Service: Urology;  Laterality: Right;  . laporoscopy    . LITHOTRIPSY    . WISDOM TOOTH EXTRACTION    . WRIST SURGERY Right    CYST REMOVED    Home Medications:  No medications prior to admission.    Allergies:  Allergies  Allergen Reactions  . Hydrocodone-Homatropine Itching and Rash  . Omnicef [Cefdinir] Other (See Comments)    Gi upset  . Sulfa Antibiotics Nausea And Vomiting  . Nickel Rash    Family History  Problem Relation Age of Onset  . Diabetes Mother   . Alcohol abuse Mother   . Diabetes Father   . Heart disease Father   .  Alcohol abuse Father   . Cancer Neg Hx   . Stroke Neg Hx   . Hyperlipidemia Neg Hx   . Hypertension Neg Hx   . Kidney disease Neg Hx   . Arthritis Neg Hx     Social History:  reports that she has never smoked. She has never used smokeless tobacco. She reports previous alcohol use. She reports that she does not use drugs.  ROS: A complete review of systems was performed.  All systems are negative except for pertinent findings as noted.  Physical Exam:  Vital signs in last 24 hours:   General:  Alert and oriented, No acute distress HEENT: Normocephalic, atraumatic Neck: No JVD or lymphadenopathy Cardiovascular: Regular rate and rhythm Lungs: Clear bilaterally Abdomen: Soft, nontender, nondistended, no abdominal masses Back: No CVA tenderness Extremities: No edema Neurologic: Grossly intact  Laboratory Data:  No results found for this or any previous visit (from the past 24 hour(s)). Recent Results (from the past 240 hour(s))  SARS CORONAVIRUS 2 (TAT 6-24 HRS) Nasopharyngeal Nasopharyngeal Swab     Status: None   Collection Time: 03/26/19  8:08 AM   Specimen: Nasopharyngeal Swab  Result Value Ref Range Status   SARS Coronavirus 2 NEGATIVE NEGATIVE Final    Comment: (NOTE) SARS-CoV-2 target nucleic acids are NOT DETECTED. The SARS-CoV-2 RNA is generally detectable in upper and lower respiratory  specimens during the acute phase of infection. Negative results do not preclude SARS-CoV-2 infection, do not rule out co-infections with other pathogens, and should not be used as the sole basis for treatment or other patient management decisions. Negative results must be combined with clinical observations, patient history, and epidemiological information. The expected result is Negative. Fact Sheet for Patients: SugarRoll.be Fact Sheet for Healthcare Providers: https://www.woods-mathews.com/ This test is not yet approved or cleared by the  [place] FDA and  has been authorized for detection and/or diagnosis of SARS-CoV-2 by FDA under an Emergency Use Authorization (EUA). This EUA will remain  in effect (meaning this test can be used) for the duration of the COVID-19 declaration under Section 56 4(b)(1) of the Act, 21 U.S.C. section 360bbb-3(b)(1), unless the authorization is terminated or revoked sooner. Performed at [facility], [place] [address] [place], [place] [address]   SARS CORONAVIRUS 2 (TAT 6-24 HRS) Nasopharyngeal Nasopharyngeal Swab     Status: None   Collection Time: 03/27/19  2:37 PM   Specimen: Nasopharyngeal Swab  Result Value Ref Range Status   SARS Coronavirus 2 NEGATIVE NEGATIVE Final    Comment: (NOTE) SARS-CoV-2 target nucleic acids are NOT DETECTED. The SARS-CoV-2 RNA is generally detectable in upper and lower respiratory specimens during the acute phase of infection. Negative results do not preclude SARS-CoV-2 infection, do not rule out co-infections with other pathogens, and should not be used as the sole basis for treatment or other patient management decisions. Negative results must be combined with clinical observations, patient history, and epidemiological information. The expected result is Negative. Fact Sheet for Patients: SugarRoll.be Fact Sheet for Healthcare Providers: https://www.woods-mathews.com/ This test is not yet approved or cleared by the [place] FDA and  has been authorized for detection and/or diagnosis of SARS-CoV-2 by FDA under an Emergency Use Authorization (EUA). This EUA will remain  in effect (meaning this test can be used) for the duration of the COVID-19 declaration under Section 56 4(b)(1) of the Act, 21 U.S.C. section 360bbb-3(b)(1), unless the authorization is terminated or revoked sooner. Performed at [facility], [place] [address], [place], [place] [address]    Creatinine: No results for input(s):  CREATININE in the last 168 hours.  Radiologic Imaging: No results found.  Impression/Assessment:  Rt ureteral calculi  Plan:  Cysto, Rt RGP, Rt URS w/HLL, extraction of remaining stones  [name] 03/29/2019, 6:56 AM  Melanie Boxer. Jaaron Oleson MD

## 2019-03-29 NOTE — Discharge Instructions (Signed)
1. You may see some blood in the urine and may have some burning with urination for 48-72 hours. You also may notice that you have to urinate more frequently or urgently after your procedure which is normal.  2. You should call should you develop an inability urinate, fever > 101, persistent nausea and vomiting that prevents you from eating or drinking to stay hydrated.  3. If you have a stent, you will likely urinate more frequently and urgently until the stent is removed and you may experience some discomfort/pain in the lower abdomen and flank especially when urinating. You may take pain medication prescribed to you if needed for pain. You may also intermittently have blood in the urine until the stent is removed.  It is okay to pull the string to remove the stent on Monday morning.  [facility] [phone] Post Ureteroscopy With or Without Stent Instructions  Definitions:  Ureter: The duct that transports urine from the kidney to the bladder. Stent:   A plastic hollow tube that is placed into the ureter, from the kidney to the                 bladder to prevent the ureter from swelling shut.  GENERAL INSTRUCTIONS:  Despite the fact that no skin incisions were used, the area around the ureter and bladder is raw and irritated. The stent is a foreign body which will further irritate the bladder wall. This irritation is manifested by increased frequency of urination, both day and night, and by an increase in the urge to urinate. In some, the urge to urinate is present almost always. Sometimes the urge is strong enough that you may not be able to stop yourself from urinating. The only real cure is to remove the stent and then give time for the bladder wall to heal which can't be done until the danger of the ureter swelling shut has passed, which varies.  You may see some blood in your urine while the stent is in place and a few days afterwards. Do not be alarmed, even if the urine  was clear for a while. Get off your feet and drink lots of fluids until clearing occurs. If you start to pass clots or don't improve, call us.  DIET: You may return to your normal diet immediately. Because of the raw surface of your bladder, alcohol, spicy foods, acid type foods and drinks with caffeine may cause irritation or frequency and should be used in moderation. To keep your urine flowing freely and to avoid constipation, drink plenty of fluids during the day ( 8-10 glasses ). Tip: Avoid cranberry juice because it is very acidic.  ACTIVITY: Your physical activity doesn't need to be restricted. However, if you are very active, you may see some blood in your urine. We suggest that you reduce your activity under these circumstances until the bleeding has stopped.  BOWELS: It is important to keep your bowels regular during the postoperative period. Straining with bowel movements can cause bleeding. A bowel movement every other day is reasonable. Use a mild laxative if needed, such as Milk of Magnesia 2-3 tablespoons, or 2 Dulcolax tablets. Call if you continue to have problems. If you have been taking narcotics for pain, before, during or after your surgery, you may be constipated. Take a laxative if necessary.   MEDICATION: You should resume your pre-surgery medications unless told not to. In addition you will often be given an antibiotic to prevent infection. These should  be taken as prescribed until the bottles are finished unless you are having an unusual reaction to one of the drugs.  PROBLEMS YOU SHOULD REPORT TO [place]:  Fevers over 100.5 Fahrenheit.  Heavy bleeding, or clots ( See above notes about blood in urine ).  Inability to urinate.  Drug reactions ( hives, rash, nausea, vomiting, diarrhea ).  Severe burning or pain with urination that is not improving.  FOLLOW-UP: You will need a follow-up appointment to monitor your progress. Call for this appointment at the number listed  above. Usually the first appointment will be about three to fourteen days after your surgery.      Post Anesthesia Home Care Instructions  Activity: Get plenty of rest for the remainder of the day. A responsible adult should stay with you for 24 hours following the procedure.  For the next 24 hours, DO NOT: -Drive a car -Paediatric nurse -Drink alcoholic beverages -Take any medication unless instructed by your physician -Make any legal decisions or sign important papers.  Meals: Start with liquid foods such as gelatin or soup. Progress to regular foods as tolerated. Avoid greasy, spicy, heavy foods. If nausea and/or vomiting occur, drink only clear liquids until the nausea and/or vomiting subsides. Call your physician if vomiting continues.  Special Instructions/Symptoms: Your throat may feel dry or sore from the anesthesia or the breathing tube placed in your throat during surgery. If this causes discomfort, gargle with warm salt water. The discomfort should disappear within 24 hours.  If you had a scopolamine patch placed behind your ear for the management of post- operative nausea and/or vomiting:  1. The medication in the patch is effective for 72 hours, after which it should be removed.  Wrap patch in a tissue and discard in the trash. Wash hands thoroughly with soap and water. 2. You may remove the patch earlier than 72 hours if you experience unpleasant side effects which may include dry mouth, dizziness or visual disturbances. 3. Avoid touching the patch. Wash your hands with soap and water after contact with the patch.

## 2019-03-29 NOTE — Interval H&P Note (Signed)
History and Physical Interval Note:  03/29/2019 11:43 AM  [name]  has presented today for surgery, with the diagnosis of RIGHT URETERAL CALCULUS.  The various methods of treatment have been discussed with the patient and family. After consideration of risks, benefits and other options for treatment, the patient has consented to  Procedure(s) with comments: CYSTOSCOPY WITH RETROGRADE PYELOGRAM, URETEROSCOPY AND STENT PLACEMENT (Right) - 1 HR HOLMIUM LASER APPLICATION (Right) as a surgical intervention.  The patient's history has been reviewed, patient examined, no change in status, stable for surgery.  I have reviewed the patient's chart and labs.  Questions were answered to the patient's satisfaction.     Bertram Millard Netha Dafoe

## 2019-03-29 NOTE — Op Note (Signed)
Preoperative diagnosis: Right ureteral stone, proximal in location, status post shockwave lithotripsy with persistent symptoms  Postoperative diagnosis: Same  Principal procedure: Cystoscopy, right retrograde ureteropyelogram, fluoroscopic interpretation, right ureteroscopy, extraction of right ureteral and renal calculi, placement of 6 French by 24 cm contour double-J stent with tether  Surgeon: Skyy Nilan  Anesthesia: General with LMA  Complications: None  Specimen: Stone fragments  Drains: 6 [place] by 24 cm contour stent with tether  Estimated blood loss: None  Indications: 54 year old female with history of large right UPJ/renal pelvic stone, status post lithotripsy on 19 November, 2020.  She has persistent stone fragments in her right upper ureter with occasional symptoms as well as persistent hydronephrosis.  She presents at this time for definitive ureteroscopic management of the stones.  I discussed the procedure as well as risks and complications with the patient.  These include but are not limited to infection, bleeding, need for stent, stent symptoms, ureteral injury, anesthetic complications among others.  She understands these and desires to proceed.  Description of procedure: The patient was properly identified and marked in the holding area patient was taken to the operating room where general anesthetic was administered with the LMA.  She was placed in the dorsolithotomy position.  Genitalia and perineum were prepped and draped.  Proper timeout was performed.  21 French panendoscope was advanced into the bladder with normal findings.  The right ureteral orifice was cannulated with a 6 [place] open-ended catheter.  This revealed a normal caliber ureter throughout, but a filling defect was noted in the proximal third of the ureter.  There was no significant hydronephrosis.  Following this, guidewire was advanced into the renal calyceal system.  Over top of this, the ureter was  dilated first with the obturator than the entire 12/14 ureteral access catheter.  This was then removed.  The guidewire was left in place.  The semirigid 6 French dual-lumen ureteroscope was advanced up to the proximal ureter where 1 or 2 stones were seen.  The smallest of these were grasped.  The others were rinsed into the renal pelvis and were no longer in the ureter.  I then removed the semirigid ureteroscope and using the ureteral access catheter then passed the flexible ureteroscope into the pyelocalyceal system.  Each of the calyces were inspected.  In the interpolar calyx there were several fragments.  There were no fragments within the renal pelvis.  There was no abnormal urothelium within the pyelocalyceal system.  Using the engage basket all fragments were removed through the access catheter.  No further calculi were noted.  The scope was removed.  Guidewire was replaced through the access catheter, and the guidewire was backloaded through the cystoscope.  A 24 cm x 6 French contour double-J stent with a tether left on was then deployed in the right ureter with excellent distal and proximal curl seen using cystoscopy and fluoroscopy, respectively.  The bladder was then drained.  The scope was removed.  The tether was brought out through the urethra.  It was tied in a knot right outside the urethra, trimmed, and then tucked in the vagina.  At this point, the procedure was terminated.  The patient was awakened and taken to the PACU in stable condition.  She tolerated the procedure well.

## 2019-03-29 NOTE — Anesthesia Procedure Notes (Signed)
Performed by: Minerva Ends, CRNA Oxygen Delivery Method: Simple face mask Ventilation: Oral airway inserted - appropriate to patient size Placement Confirmation: positive ETCO2 and breath sounds checked- equal and bilateral Dental Injury: Teeth and Oropharynx as per pre-operative assessment

## 2019-03-29 NOTE — Anesthesia Preprocedure Evaluation (Addendum)
Anesthesia Evaluation  Patient identified by MRN, date of birth, ID band Patient awake    Reviewed: Allergy & Precautions, NPO status , Patient's Chart, lab work & pertinent test results  History of Anesthesia Complications (+) PONV and history of anesthetic complications  Airway Mallampati: III  TM Distance: >3 FB Neck ROM: Full    Dental  (+) Dental Advisory Given, Teeth Intact   Pulmonary sleep apnea ,    breath sounds clear to auscultation       Cardiovascular negative cardio ROS   Rhythm:Regular     Neuro/Psych  Headaches, PSYCHIATRIC DISORDERS Depression    GI/Hepatic   Endo/Other  Hypothyroidism Morbid obesity  Renal/GU      Musculoskeletal  (+) Arthritis ,   Abdominal   Peds  Hematology negative hematology ROS (+)   Anesthesia Other Findings   Reproductive/Obstetrics                            Anesthesia Physical Anesthesia Plan  ASA: II  Anesthesia Plan: General   Post-op Pain Management:    Induction: Intravenous  PONV Risk Score and Plan: 4 or greater and Ondansetron, Dexamethasone and Scopolamine patch - Pre-op  Airway Management Planned: LMA and Oral ETT  Additional Equipment: None  Intra-op Plan:   Post-operative Plan: Extubation in OR  Informed Consent: I have reviewed the patients History and Physical, chart, labs and discussed the procedure including the risks, benefits and alternatives for the proposed anesthesia with the patient or authorized representative who has indicated his/her understanding and acceptance.       Plan Discussed with: CRNA and Surgeon  Anesthesia Plan Comments:        Anesthesia Quick Evaluation

## 2019-04-01 NOTE — Progress Notes (Signed)
Pt states she took stent out Sunday due to pain and leaking, also states she feels poorly today and has a low temperature, Instructed pt to call MD office for appointment today. Also, pt states her throat is still very sore, encouraged warm salt water gargles and chloraseptic

## 2019-04-03 NOTE — Anesthesia Postprocedure Evaluation (Signed)
Anesthesia Post Note  Patient: Ginette Pitman Cordy  Procedure(s) Performed: CYSTOSCOPY WITH RETROGRADE PYELOGRAM, URETEROSCOPY AND STENT PLACEMENT (Right Ureter)     Patient location during evaluation: PACU Anesthesia Type: General Level of consciousness: awake and alert Pain management: pain level controlled Vital Signs Assessment: post-procedure vital signs reviewed and stable Respiratory status: spontaneous breathing, nonlabored ventilation, respiratory function stable and patient connected to nasal cannula oxygen Cardiovascular status: blood pressure returned to baseline and stable Postop Assessment: no apparent nausea or vomiting Anesthetic complications: no    Last Vitals:  Vitals:   03/29/19 1400 03/29/19 1514  BP: 134/84 133/83  Pulse: 70 79  Resp: 19 14  Temp:  36.4 C  SpO2: 92% 97%    Last Pain:  Vitals:   03/29/19 1514  TempSrc:   PainSc: 3                  Ainara Eldridge

## 2019-04-06 ENCOUNTER — Other Ambulatory Visit: Payer: Self-pay | Admitting: Internal Medicine

## 2019-04-06 DIAGNOSIS — E034 Atrophy of thyroid (acquired): Secondary | ICD-10-CM

## 2019-04-25 ENCOUNTER — Ambulatory Visit: Payer: BC Managed Care – PPO | Attending: Internal Medicine

## 2019-04-25 DIAGNOSIS — Z23 Encounter for immunization: Secondary | ICD-10-CM

## 2019-04-25 NOTE — Progress Notes (Signed)
   Covid-19 Vaccination Clinic  Name:  [name]    MRN: [id number] DOB: 07-04-1965  04/25/2019  [name] was observed post Covid-19 immunization for 15 minutes without incident. She was provided with Vaccine Information Sheet and instruction to access the V-Safe system.   [name] was instructed to call 911 with any severe reactions post vaccine: [name] Difficulty breathing  . Swelling of face and throat  . A fast heartbeat  . A bad rash all over body  . Dizziness and weakness   Immunizations Administered    Name Date Dose VIS Date Route   Pfizer COVID-19 Vaccine 04/25/2019 10:23 AM 0.3 mL 01/18/2019 Intramuscular   Manufacturer: [employer], [employer]   Lot: [id number]   NDC: [address]-4

## 2019-04-29 ENCOUNTER — Ambulatory Visit: Payer: BC Managed Care – PPO | Admitting: Internal Medicine

## 2019-04-29 ENCOUNTER — Encounter: Payer: Self-pay | Admitting: Internal Medicine

## 2019-04-29 ENCOUNTER — Other Ambulatory Visit: Payer: Self-pay

## 2019-04-29 ENCOUNTER — Ambulatory Visit (INDEPENDENT_AMBULATORY_CARE_PROVIDER_SITE_OTHER): Payer: BC Managed Care – PPO

## 2019-04-29 VITALS — BP 138/88 | HR 91 | Temp 98.1°F | Resp 16 | Ht 64.0 in | Wt 205.0 lb

## 2019-04-29 DIAGNOSIS — Z6841 Body Mass Index (BMI) 40.0 and over, adult: Secondary | ICD-10-CM

## 2019-04-29 DIAGNOSIS — R739 Hyperglycemia, unspecified: Secondary | ICD-10-CM | POA: Diagnosis not present

## 2019-04-29 DIAGNOSIS — F32A Depression, unspecified: Secondary | ICD-10-CM

## 2019-04-29 DIAGNOSIS — Z0000 Encounter for general adult medical examination without abnormal findings: Secondary | ICD-10-CM

## 2019-04-29 DIAGNOSIS — M25511 Pain in right shoulder: Secondary | ICD-10-CM | POA: Insufficient documentation

## 2019-04-29 DIAGNOSIS — K589 Irritable bowel syndrome without diarrhea: Secondary | ICD-10-CM | POA: Insufficient documentation

## 2019-04-29 DIAGNOSIS — F329 Major depressive disorder, single episode, unspecified: Secondary | ICD-10-CM | POA: Diagnosis not present

## 2019-04-29 DIAGNOSIS — E034 Atrophy of thyroid (acquired): Secondary | ICD-10-CM | POA: Diagnosis not present

## 2019-04-29 DIAGNOSIS — N201 Calculus of ureter: Secondary | ICD-10-CM | POA: Insufficient documentation

## 2019-04-29 DIAGNOSIS — N926 Irregular menstruation, unspecified: Secondary | ICD-10-CM | POA: Insufficient documentation

## 2019-04-29 DIAGNOSIS — N803 Endometriosis of pelvic peritoneum: Secondary | ICD-10-CM | POA: Insufficient documentation

## 2019-04-29 DIAGNOSIS — N3010 Interstitial cystitis (chronic) without hematuria: Secondary | ICD-10-CM | POA: Insufficient documentation

## 2019-04-29 DIAGNOSIS — IMO0002 Reserved for concepts with insufficient information to code with codable children: Secondary | ICD-10-CM | POA: Insufficient documentation

## 2019-04-29 DIAGNOSIS — L309 Dermatitis, unspecified: Secondary | ICD-10-CM | POA: Insufficient documentation

## 2019-04-29 LAB — CBC WITH DIFFERENTIAL/PLATELET
Basophils Absolute: 0 10*3/uL (ref 0.0–0.1)
Basophils Relative: 0.4 % (ref 0.0–3.0)
Eosinophils Absolute: 0.1 10*3/uL (ref 0.0–0.7)
Eosinophils Relative: 1.1 % (ref 0.0–5.0)
HCT: 42 % (ref 36.0–46.0)
Hemoglobin: 14.2 g/dL (ref 12.0–15.0)
Lymphocytes Relative: 24.4 % (ref 12.0–46.0)
Lymphs Abs: 2.2 10*3/uL (ref 0.7–4.0)
MCHC: 33.7 g/dL (ref 30.0–36.0)
MCV: 87.3 fl (ref 78.0–100.0)
Monocytes Absolute: 0.7 10*3/uL (ref 0.1–1.0)
Monocytes Relative: 7.6 % (ref 3.0–12.0)
Neutro Abs: 5.9 10*3/uL (ref 1.4–7.7)
Neutrophils Relative %: 66.5 % (ref 43.0–77.0)
Platelets: 268 10*3/uL (ref 150.0–400.0)
RBC: 4.82 Mil/uL (ref 3.87–5.11)
RDW: 13.5 % (ref 11.5–15.5)
WBC: 8.9 10*3/uL (ref 4.0–10.5)

## 2019-04-29 LAB — HEPATIC FUNCTION PANEL
ALT: 17 U/L (ref 0–35)
AST: 17 U/L (ref 0–37)
Albumin: 4.2 g/dL (ref 3.5–5.2)
Alkaline Phosphatase: 93 U/L (ref 39–117)
Bilirubin, Direct: 0.1 mg/dL (ref 0.0–0.3)
Total Bilirubin: 0.6 mg/dL (ref 0.2–1.2)
Total Protein: 7 g/dL (ref 6.0–8.3)

## 2019-04-29 LAB — BASIC METABOLIC PANEL
BUN: 14 mg/dL (ref 6–23)
CO2: 29 mEq/L (ref 19–32)
Calcium: 9.6 mg/dL (ref 8.4–10.5)
Chloride: 104 mEq/L (ref 96–112)
Creatinine, Ser: 0.88 mg/dL (ref 0.40–1.20)
GFR: 67.55 mL/min (ref >60.00)
Glucose, Bld: 89 mg/dL (ref 70–99)
Potassium: 3.9 mEq/L (ref 3.5–5.1)
Sodium: 139 mEq/L (ref 135–145)

## 2019-04-29 LAB — LIPID PANEL
Cholesterol: 180 mg/dL (ref 0–200)
HDL: 51.2 mg/dL (ref >39.00)
LDL Cholesterol: 106 mg/dL — ABNORMAL HIGH (ref 0–99)
NonHDL: 128.37
Total CHOL/HDL Ratio: 4
Triglycerides: 112 mg/dL (ref 0.0–149.0)
VLDL: 22.4 mg/dL (ref 0.0–40.0)

## 2019-04-29 LAB — TSH: TSH: 5.33 u[IU]/mL — ABNORMAL HIGH (ref 0.35–4.50)

## 2019-04-29 LAB — HEMOGLOBIN A1C: Hgb A1c MFr Bld: 5.2 % (ref 4.6–6.5)

## 2019-04-29 MED ORDER — DULOXETINE HCL 30 MG PO CPEP: 30.0000 mg | ORAL_CAPSULE | Freq: Every day | ORAL | 1 refills | Status: DC

## 2019-04-29 MED ORDER — SAXENDA 18 MG/3ML ~~LOC~~ SOPN: 3.0000 mg | PEN_INJECTOR | Freq: Every day | SUBCUTANEOUS | 5 refills | Status: DC

## 2019-04-29 NOTE — Patient Instructions (Signed)
Major Depressive Disorder, Adult Major depressive disorder (MDD) is a mental health condition. It may also be called clinical depression or unipolar depression. MDD usually causes feelings of sadness, hopelessness, or helplessness. MDD can also cause physical symptoms. It can interfere with work, school, relationships, and other everyday activities. MDD may be mild, moderate, or severe. It may occur once (single episode major depressive disorder) or it may occur multiple times (recurrent major depressive disorder). What are the causes? The exact cause of this condition is not known. MDD is most likely caused by a combination of things, which may include:  Genetic factors. These are traits that are passed along from parent to child.  Individual factors. Your personality, your behavior, and the way you handle your thoughts and feelings may contribute to MDD. This includes personality traits and behaviors learned from others.  Physical factors, such as: ? Differences in the part of your brain that controls emotion. This part of your brain may be different than it is in people who do not have MDD. ? Long-term (chronic) medical or psychiatric illnesses.  Social factors. Traumatic experiences or major life changes may play a role in the development of MDD. What increases the risk? This condition is more likely to develop in women. The following factors may also make you more likely to develop MDD:  A family history of depression.  Troubled family relationships.  Abnormally low levels of certain brain chemicals.  Traumatic events in childhood, especially abuse or the loss of a parent.  Being under a lot of stress, or long-term stress, especially from upsetting life experiences or losses.  A history of: ? Chronic physical illness. ? Other mental health disorders. ? Substance abuse.  Poor living conditions.  Experiencing social exclusion or discrimination on a regular basis. What are the  signs or symptoms? The main symptoms of MDD typically include:  Constant depressed or irritable mood.  Loss of interest in things and activities. MDD symptoms may also include:  Sleeping or eating too much or too little.  Unexplained weight change.  Fatigue or low energy.  Feelings of worthlessness or guilt.  Difficulty thinking clearly or making decisions.  Thoughts of suicide or of harming others.  Physical agitation or weakness.  Isolation. Severe cases of MDD may also occur with other symptoms, such as:  Delusions or hallucinations, in which you imagine things that are not real (psychotic depression).  Low-level depression that lasts at least a year (chronic depression or persistent depressive disorder).  Extreme sadness and hopelessness (melancholic depression).  Trouble speaking and moving (catatonic depression). How is this diagnosed? This condition may be diagnosed based on:  Your symptoms.  Your medical history, including your mental health history. This may involve tests to evaluate your mental health. You may be asked questions about your lifestyle, including any drug and alcohol use, and how long you have had symptoms of MDD.  A physical exam.  Blood tests to rule out other conditions. You must have a depressed mood and at least four other MDD symptoms most of the day, nearly every day in the same 2-week timeframe before your health care provider can confirm a diagnosis of MDD. How is this treated? This condition is usually treated by mental health professionals, such as psychologists, psychiatrists, and clinical social workers. You may need more than one type of treatment. Treatment may include:  Psychotherapy. This is also called talk therapy or counseling. Types of psychotherapy include: ? Cognitive behavioral therapy (CBT). This type of therapy   teaches you to recognize unhealthy feelings, thoughts, and behaviors, and replace them with positive thoughts  and actions. ? Interpersonal therapy (IPT). This helps you to improve the way you relate to and communicate with others. ? Family therapy. This treatment includes members of your family.  Medicine to treat anxiety and depression, or to help you control certain emotions and behaviors.  Lifestyle changes, such as: ? Limiting alcohol and drug use. ? Exercising regularly. ? Getting plenty of sleep. ? Making healthy eating choices. ? Spending more time outdoors.  Treatments involving stimulation of the brain can be used in situations with extremely severe symptoms, or when medicine or other therapies do not work over time. These treatments include electroconvulsive therapy, transcranial magnetic stimulation, and vagal nerve stimulation. Follow these instructions at home: Activity  Return to your normal activities as told by your health care provider.  Exercise regularly and spend time outdoors as told by your health care provider. General instructions  Take over-the-counter and prescription medicines only as told by your health care provider.  Do not drink alcohol. If you drink alcohol, limit your alcohol intake to no more than 1 drink a day for nonpregnant women and 2 drinks a day for men. One drink equals 12 oz of beer, 5 oz of wine, or 1 oz of hard liquor. Alcohol can affect any antidepressant medicines you are taking. Talk to your health care provider about your alcohol use.  Eat a healthy diet and get plenty of sleep.  Find activities that you enjoy doing, and make time to do them.  Consider joining a support group. Your health care provider may be able to recommend a support group.  Keep all follow-up visits as told by your health care provider. This is important. Where to find more information National Alliance on Mental Illness  www.nami.org [place] National Institute of Mental Health  www.nimh.nih.gov National Suicide Prevention Lifeline  1-800-273-TALK (8255). This is  free, 24-hour help. Contact a health care provider if:  Your symptoms get worse.  You develop new symptoms. Get help right away if:  You self-harm.  You have serious thoughts about hurting yourself or others.  You see, hear, taste, smell, or feel things that are not present (hallucinate). This information is not intended to replace advice given to you by your health care provider. Make sure you discuss any questions you have with your health care provider. Document Revised: 01/06/2017 Document Reviewed: 08/05/2015 [employer] Patient Education  2020 [employer]  

## 2019-04-29 NOTE — Progress Notes (Signed)
Subjective:  Patient ID: [name], female    DOB: [birth year]  Age: 54 y.o. MRN: [id number]  CC: Hypertension, Hypothyroidism, and Annual Exam  This visit occurred during the SARS-CoV-2 public health emergency.  Safety protocols were in place, including screening questions prior to the visit, additional usage of staff PPE, and extensive cleaning of exam room while observing appropriate contact time as indicated for disinfecting solutions.    HPI [name] presents for a CPX.  She complains that 2 weeks ago she rolled out of her bed and landed on her right shoulder.  She has pain and decreased range of motion.  She is getting adequate symptom relief with Motrin.  She feels like she is under a lot of stress and wants to add an antidepressant to the mirtazapine.  She complains of irritability, insomnia, anhedonia, and increased appetite.  She denies SI, HI, or feeling hopeless or helpless.  Outpatient Medications Prior to Visit  Medication Sig Dispense Refill  . azelastine (OPTIVAR) 0.05 % ophthalmic solution Place 1 drop into both eyes 2 (two) times daily as needed.    . fluticasone (FLONASE) 50 MCG/ACT nasal spray Place 1 spray into both nostrils daily.    . Insulin Pen Needle (NOVOFINE) 32G X 6 MM MISC Inject 1 Act into the skin daily. 100 each 1  . levocetirizine (XYZAL) 5 MG tablet Take 5 mg by mouth every evening.    [name] levonorgestrel (MIRENA, 52 MG,) 20 MCG/24HR IUD 1 each by Intrauterine route once.    . mirtazapine (REMERON) 15 MG tablet Take 1 tablet (15 mg total) by mouth at bedtime. 90 tablet 1  . Riboflavin (B-2) 100 MG TABS Take 400 mg by mouth QID. 120 tablet 5  . SUMAtriptan (IMITREX) 50 MG tablet Take 50 mg by mouth every 2 (two) hours as needed for migraine. May repeat in 2 hours if headache persists or recurs.    [name] levothyroxine (SYNTHROID) 50 MCG tablet Take 1 tablet (50 mcg total) by mouth daily. (Patient taking differently: Take 50 mcg by mouth daily. ) 90  tablet 1  . Liraglutide -Weight Management (SAXENDA) 18 MG/3ML SOPN Inject 3 mg into the skin daily. (Patient taking differently: Inject 3 mg into the skin at bedtime. ) 5 pen 5  . HYDROmorphone (DILAUDID) 2 MG tablet Take 2 mg by mouth every 4 (four) hours as needed for severe pain.    . meloxicam (MOBIC) 15 MG tablet Take 1 tablet (15 mg total) by mouth daily. 90 tablet 1  . mometasone (ELOCON) 0.1 % cream Apply 1 application topically 2 (two) times daily as needed.    . nitrofurantoin, macrocrystal-monohydrate, (MACROBID) 100 MG capsule Take 1 capsule (100 mg total) by mouth 2 (two) times daily. 6 capsule 0  . ondansetron (ZOFRAN-ODT) 4 MG disintegrating tablet Take 1 tablet (4 mg total) by mouth every 8 (eight) hours as needed for nausea or vomiting. 8 tablet 0  . oxybutynin (DITROPAN) 5 MG tablet Take 1 tablet (5 mg total) by mouth every 8 (eight) hours as needed for bladder spasms. 30 tablet 1  . oxyCODONE-acetaminophen (PERCOCET/ROXICET) 5-325 MG tablet Take 1-2 tablets by mouth every 8 (eight) hours as needed for severe pain. 15 tablet 0  . PE-DM-APAP & PE-Doxyl-DM-APAP (ALKA-SELTZER PLUS DAY/NIGHT) MISC Take 2 capsules by mouth every 6 (six) hours as needed (cold symptoms).     No facility-administered medications prior to visit.    ROS Review of Systems  Constitutional: Positive for unexpected  weight change (wt gain). Negative for chills, diaphoresis and fatigue.  HENT: Negative.   Eyes: Negative for visual disturbance.  Respiratory: Negative for cough, chest tightness, shortness of breath and wheezing.   Cardiovascular: Negative for chest pain, palpitations and leg swelling.  Gastrointestinal: Negative for abdominal pain, constipation, diarrhea, nausea and vomiting.  Endocrine: Negative.  Negative for cold intolerance and heat intolerance.  Genitourinary: Negative.  Negative for difficulty urinating.  Musculoskeletal: Positive for arthralgias. Negative for back pain and myalgias.    Skin: Negative.   Neurological: Negative.   Hematological: Negative for adenopathy. Does not bruise/bleed easily.  Psychiatric/Behavioral: Positive for dysphoric mood and sleep disturbance. Negative for confusion, decreased concentration, self-injury and suicidal ideas. The patient is nervous/anxious.     Objective:  BP 138/88 (BP Location: Left Arm, Patient Position: Sitting, Cuff Size: Large)   Pulse 91   Temp 98.1 F (36.7 C) (Oral)   Resp 16   Ht 5\' 4"  (1.626 m)   Wt 205 lb (93 kg)   SpO2 97%   BMI 35.19 kg/m   BP Readings from Last 3 Encounters:  04/29/19 138/88  03/29/19 133/83  12/27/18 134/85    Wt Readings from Last 3 Encounters:  04/29/19 205 lb (93 kg)  03/29/19 201 lb 9.6 oz (91.4 kg)  12/25/18 207 lb (93.9 kg)    Physical Exam Vitals reviewed.  Constitutional:      Appearance: She is obese.  HENT:     Nose: Nose normal.     Mouth/Throat:     Mouth: Mucous membranes are moist.     Pharynx: No oropharyngeal exudate.  Eyes:     General: No scleral icterus.    Conjunctiva/sclera: Conjunctivae normal.  Cardiovascular:     Rate and Rhythm: Normal rate and regular rhythm.     Heart sounds: No murmur.  Pulmonary:     Effort: Pulmonary effort is normal.     Breath sounds: No stridor. No wheezing, rhonchi or rales.  Abdominal:     General: Abdomen is protuberant. Bowel sounds are normal. There is no distension.     Palpations: Abdomen is soft. There is no hepatomegaly, splenomegaly or mass.     Tenderness: There is no abdominal tenderness.  Musculoskeletal:     Right shoulder: Tenderness present. No swelling, deformity, bony tenderness or crepitus. Decreased range of motion.     Left shoulder: Normal.     Cervical back: Neck supple.     Right lower leg: No edema.     Left lower leg: No edema.  Lymphadenopathy:     Cervical: No cervical adenopathy.  Skin:    General: Skin is warm and dry.     Coloration: Skin is not pale.  Neurological:     General:  No focal deficit present.     Mental Status: She is alert and oriented to person, place, and time. Mental status is at baseline.  Psychiatric:        Attention and Perception: Attention normal.        Mood and Affect: Mood normal. Mood is not anxious. Affect is flat.        Speech: Speech normal.        Behavior: Behavior normal. Behavior is cooperative.        Thought Content: Thought content normal. Thought content is not paranoid or delusional. Thought content does not include homicidal or suicidal ideation. Thought content does not include homicidal plan.        Cognition and Memory:  Cognition normal.        Judgment: Judgment normal.     Lab Results  Component Value Date   WBC 8.9 04/29/2019   HGB 14.2 04/29/2019   HCT 42.0 04/29/2019   PLT 268.0 04/29/2019   GLUCOSE 89 04/29/2019   CHOL 180 04/29/2019   TRIG 112.0 04/29/2019   HDL 51.20 04/29/2019   LDLDIRECT 131.0 01/18/2018   LDLCALC 106 (H) 04/29/2019   ALT 17 04/29/2019   AST 17 04/29/2019   NA 139 04/29/2019   K 3.9 04/29/2019   CL 104 04/29/2019   CREATININE 0.88 04/29/2019   BUN 14 04/29/2019   CO2 29 04/29/2019   TSH 5.33 (H) 04/29/2019   HGBA1C 5.2 04/29/2019    DG Shoulder Right  Result Date: 04/29/2019 CLINICAL DATA:  Pain for 2 weeks after fall.  Pain anteriorly. EXAM: RIGHT SHOULDER - 2+ VIEW COMPARISON:  None. FINDINGS: There is no evidence of fracture or dislocation. There is no evidence of arthropathy or other focal bone abnormality. Soft tissues are unremarkable. IMPRESSION: No fracture or dislocation of the right shoulder. Electronically Signed   By: Narda Rutherford M.D.   On: 04/29/2019 20:07    Assessment & Plan:   Carolin was seen today for hypertension, hypothyroidism and annual exam.  Diagnoses and all orders for this visit:  Hypothyroidism due to acquired atrophy of thyroid- Her TSH is elevated and she is symptomatic.  I recommended that she increase the levothyroxine dose and to switch  to brand-name Synthroid. -     CBC with Differential/Platelet; Future -     TSH; Future -     Hepatic function panel; Future -     Hepatic function panel -     TSH -     CBC with Differential/Platelet -     levothyroxine (SYNTHROID) 75 MCG tablet; Take 1 tablet (75 mcg total) by mouth daily before breakfast.  Routine adult health maintenance- Exam completed, labs reviewed, vaccines reviewed and updated, cancer screenings are all up-to-date, patient education was given. -     Lipid panel; Future -     HIV Antibody (routine testing w rflx); Future -     HIV Antibody (routine testing w rflx) -     Lipid panel  Hyperglycemia- Her blood sugar is normal now. -     Hemoglobin A1c; Future -     Hepatic function panel; Future -     Basic metabolic panel; Future -     Basic metabolic panel -     Hepatic function panel -     Hemoglobin A1c  Acute pain of right shoulder- She has shoulder pain and decreased range of motion after a fall.  Plain x-rays are normal.  I am concerned she may have damaged her rotator cuff so I have asked her to see sports medicine. -     DG Shoulder Right; Future -     Ambulatory referral to Sports Medicine  Chronic depression- Will layer duloxetine on top of the mirtazapine.  She is she is gaining weight so may taper off of mirtazapine over time and gradually increase the duloxetine dose. -     DULoxetine (CYMBALTA) 30 MG capsule; Take 1 capsule (30 mg total) by mouth daily.  Morbid obesity with BMI of 40.0-44.9, adult (HCC) -     Liraglutide -Weight Management (SAXENDA) 18 MG/3ML SOPN; Inject 0.5 mLs (3 mg total) into the skin daily.   I have discontinued Victorino Dike A. Meckler "Jenn"'s levothyroxine, meloxicam,  ondansetron, Alka-Seltzer Plus Day/Night, oxybutynin, oxyCODONE-acetaminophen, HYDROmorphone, mometasone, and nitrofurantoin (macrocrystal-monohydrate). I have also changed her Liraglutide -Weight Management to Saxenda. Additionally, I am having her start on  DULoxetine and levothyroxine. Lastly, I am having her maintain her B-2, Insulin Pen Needle, mirtazapine, Mirena (52 MG), SUMAtriptan, fluticasone, azelastine, and levocetirizine.  Meds ordered this encounter  Medications  . DULoxetine (CYMBALTA) 30 MG capsule    Sig: Take 1 capsule (30 mg total) by mouth daily.    Dispense:  90 capsule    Refill:  1  . Liraglutide -Weight Management (SAXENDA) 18 MG/3ML SOPN    Sig: Inject 0.5 mLs (3 mg total) into the skin daily.    Dispense:  5 pen    Refill:  5  . levothyroxine (SYNTHROID) 75 MCG tablet    Sig: Take 1 tablet (75 mcg total) by mouth daily before breakfast.    Dispense:  90 tablet    Refill:  1     Follow-up: Return in about 3 months (around 07/30/2019).  Sanda Linger, MD

## 2019-04-30 ENCOUNTER — Encounter: Payer: Self-pay | Admitting: Internal Medicine

## 2019-04-30 LAB — HIV ANTIBODY (ROUTINE TESTING W REFLEX): HIV 1&2 Ab, 4th Generation: NONREACTIVE

## 2019-04-30 MED ORDER — LEVOTHYROXINE SODIUM 75 MCG PO TABS: 75.0000 ug | ORAL_TABLET | Freq: Every day | ORAL | 1 refills | Status: DC

## 2019-05-03 ENCOUNTER — Other Ambulatory Visit: Payer: Self-pay

## 2019-05-03 ENCOUNTER — Ambulatory Visit: Payer: BC Managed Care – PPO | Admitting: Family Medicine

## 2019-05-03 ENCOUNTER — Encounter: Payer: Self-pay | Admitting: Family Medicine

## 2019-05-03 ENCOUNTER — Ambulatory Visit: Payer: Self-pay

## 2019-05-03 VITALS — BP 132/80 | HR 92 | Ht 64.0 in | Wt 204.8 lb

## 2019-05-03 DIAGNOSIS — M25511 Pain in right shoulder: Secondary | ICD-10-CM | POA: Diagnosis not present

## 2019-05-03 NOTE — Patient Instructions (Addendum)
Thank you for coming in today. Lets plan for physical therapy.  Try voltaren gel over the counter. Use it 4x daily as needed.  Ok to use tylenol as well.   Let me know if you are not improving.  Could do injection.   Recheck in 4-6 weeks.  Return sooner if needed.     Shoulder Impingement Syndrome  Shoulder impingement syndrome is a condition that causes pain when connective tissues (tendons) surrounding the shoulder joint become pinched. These tendons are part of the group of muscles and tissues that help to stabilize the shoulder (rotator cuff). Beneath the rotator cuff is a fluid-filled sac (bursa) that allows the muscles and tendons to glide smoothly. The bursa may become swollen or irritated (bursitis). Bursitis, swelling in the rotator cuff tendons, or both conditions can decrease how much space is under a bone in the shoulder joint (acromion), resulting in impingement. What are the causes? Shoulder impingement syndrome may be caused by bursitis or swelling of the rotator cuff tendons, which may result from:  Repetitive overhead arm movements.  Falling onto the shoulder.  Weakness in the shoulder muscles. What increases the risk? You may be more likely to develop this condition if you:  Play sports that involve throwing, such as baseball.  Participate in sports such as tennis, volleyball, and swimming.  Work as a Education administrator, Music therapist, or Pharmacologist. Some people are also more likely to develop impingement syndrome because of the shape of their acromion bone. What are the signs or symptoms? The main symptom of this condition is pain on the front or side of the shoulder. The pain may:  Get worse when lifting or raising the arm.  Get worse at night.  Wake you up from sleeping.  Feel sharp when the shoulder is moved and then fade to an ache. Other symptoms may include:  Tenderness.  Stiffness.  Inability to raise the arm above shoulder level or behind the  body.  Weakness. How is this diagnosed? This condition may be diagnosed based on:  Your symptoms and medical history.  A physical exam.  Imaging tests, such as: ? X-rays. ? MRI. ? Ultrasound. How is this treated? This condition may be treated by:  Resting your shoulder and avoiding all activities that cause pain or put stress on the shoulder.  Icing your shoulder.  NSAIDs to help reduce pain and swelling.  One or more injections of medicines to numb the area and reduce inflammation.  Physical therapy.  Surgery. This may be needed if nonsurgical treatments have not helped. Surgery may involve repairing the rotator cuff, reshaping the acromion, or removing the bursa. Follow these instructions at home: Managing pain, stiffness, and swelling   If directed, put ice on the injured area. ? Put ice in a plastic bag. ? Place a towel between your skin and the bag. ? Leave the ice on for 20 minutes, 2-3 times a day. Activity  Rest and return to your normal activities as told by your health care provider. Ask your health care provider what activities are safe for you.  Do exercises as told by your health care provider. General instructions  Do not use any products that contain nicotine or tobacco, such as cigarettes, e-cigarettes, and chewing tobacco. These can delay healing. If you need help quitting, ask your health care provider.  Ask your health care provider when it is safe for you to drive.  Take over-the-counter and prescription medicines only as told by your health care provider.  Keep all follow-up visits as told by your health care provider. This is important. How is this prevented?  Give your body time to rest between periods of activity.  Be safe and responsible while being active. This will help you avoid falls.  Maintain physical fitness, including strength and flexibility. Contact a health care provider if:  Your symptoms have not improved after 1-2  months of treatment and rest.  You cannot lift your arm away from your body. Summary  Shoulder impingement syndrome is a condition that causes pain when connective tissues (tendons) surrounding the shoulder joint become pinched.  The main symptom of this condition is pain on the front or side of the shoulder.  This condition is usually treated with rest, ice, and pain medicines as needed. This information is not intended to replace advice given to you by your health care provider. Make sure you discuss any questions you have with your health care provider. Document Revised: 05/18/2018 Document Reviewed: 07/19/2017 Elsevier Patient Education  2020 [employer].

## 2019-05-03 NOTE — Progress Notes (Signed)
Subjective:    I'm seeing this patient as a consultation for:  Dr. Yetta Barre. Note will be routed back to referring provider/PCP.  CC: R shoulder pain  I, Molly Weber, LAT, ATC, am serving as scribe for Dr. [name].  HPI: Pt is a 54 y/o female c/o R shoulder pain after falling out of bed and landing on her R shoulder about 2 weeks ago.  She locates her pain to her anterior shoulder .  She rates her pain at a 2-3/10 and describes her pain as aching during the day and throbbing in the more .  Radiating pain: No R shoulder mechanical symptoms: No Aggravating factors: pulling up her pants; fastening her bra; R shoulder overhead AROM; functional IF Treatments tried: Motrin; IBU; ice  Diagnostic imaging: R shoulder XR-04/29/19  Past medical history, Surgical history, Family history, Social history, Allergies, and medications have been entered into the medical record, reviewed.   Review of Systems: No new headache, visual changes, nausea, vomiting, diarrhea, constipation, dizziness, abdominal pain, skin rash, fevers, chills, night sweats, weight loss, swollen lymph nodes, body aches, joint swelling, muscle aches, chest pain, shortness of breath, mood changes, visual or auditory hallucinations.   Objective:    Vitals:   05/03/19 1120  BP: 132/80  Pulse: 92  SpO2: 96%   General: Well Developed, well nourished, and in no acute distress.  Neuro/Psych: Alert and oriented x3, extra-ocular muscles intact, able to move all 4 extremities, sensation grossly intact. Skin: Warm and dry, no rashes noted.  Respiratory: Not using accessory muscles, speaking in full sentences, trachea midline.  Cardiovascular: Pulses palpable, no extremity edema. Abdomen: Does not appear distended. MSK:  Right shoulder normal-appearing Tender palpation anterior shoulder. Range of motion abduction 130 degrees with pain. External rotation full Internal rotation to iliac crest. Strength intact external rotation  internal rotation and abduction. Mildly positive empty can test.  Mildly positive Hawkins and Neer's test. Negative Yergason's and speeds test.  Lab and Radiology Results No results found for this or any previous visit (from the past 72 hour(s)). DG Shoulder Right  Result Date: 04/29/2019 CLINICAL DATA:  Pain for 2 weeks after fall.  Pain anteriorly. EXAM: RIGHT SHOULDER - 2+ VIEW COMPARISON:  None. FINDINGS: There is no evidence of fracture or dislocation. There is no evidence of arthropathy or other focal bone abnormality. Soft tissues are unremarkable. IMPRESSION: No fracture or dislocation of the right shoulder. Electronically Signed   By: Narda Rutherford M.D.   On: 04/29/2019 20:07  I, [name], personally (independently) visualized and performed the interpretation of the images attached in this note.   Diagnostic Limited MSK Ultrasound of: Right shoulder Biceps tendon intact normal-appearing Subscapularis tendon is intact.  Superficial to the subscap tendon is hypoechoic collection of fluid consistent with bursitis.  No obvious tear visible. Supraspinatus tendon intact normal-appearing no significant subacromial bursitis present. Infraspinatus tendon slightly thinned but otherwise normal-appearing with no obvious tear. AC joint normal-appearing Impression: Anterior shoulder bursitis without obvious rotator cuff tear.    Impression and Recommendations:    Assessment and Plan: 54 y.o. female with right shoulder pain after fall.  Patient has unusual finding of hypoechoic fluid collection superficial to the subscapularis tendon.  This is likely bursitis but could possibly represent partial tear of subscap tendon.  Regardless I think this is less likely as she has great strength.  Discussed options.  Plan for trial of physical therapy and Voltaren gel.  Offered injection patient declined at this time which  is very reasonable. Recheck back with me in about a month or so.  Return sooner  if needed.  PDMP not reviewed this encounter. Orders Placed This Encounter  Procedures  . [place] LIMITED JOINT SPACE STRUCTURES UP RIGHT(NO LINKED CHARGES)    Order Specific Question:   Reason for Exam (SYMPTOM  OR DIAGNOSIS REQUIRED)    Answer:   R shoulder pain    Order Specific Question:   Preferred imaging location?    Answer:   [place]  . Ambulatory referral to Physical Therapy    Referral Priority:   Routine    Referral Type:   Physical Medicine    Referral Reason:   Specialty Services Required    Requested Specialty:   Physical Therapy   No orders of the defined types were placed in this encounter.   Discussed warning signs or symptoms. Please see discharge instructions. Patient expresses understanding.   The above documentation has been reviewed and is accurate and complete Lynne Leader

## 2019-05-08 ENCOUNTER — Other Ambulatory Visit: Payer: Self-pay | Admitting: Internal Medicine

## 2019-05-08 DIAGNOSIS — Z6841 Body Mass Index (BMI) 40.0 and over, adult: Secondary | ICD-10-CM

## 2019-05-20 ENCOUNTER — Ambulatory Visit: Payer: BC Managed Care – PPO

## 2019-05-20 ENCOUNTER — Ambulatory Visit: Payer: BC Managed Care – PPO | Admitting: Physical Therapy

## 2019-05-22 ENCOUNTER — Ambulatory Visit: Payer: BC Managed Care – PPO | Attending: Internal Medicine

## 2019-05-22 DIAGNOSIS — Z23 Encounter for immunization: Secondary | ICD-10-CM

## 2019-05-22 NOTE — Progress Notes (Signed)
   Covid-19 Vaccination Clinic  Name:  [name]    MRN: [id number] DOB: [birth year]  05/22/2019  [name] was observed post Covid-19 immunization for 15 minutes without incident. She was provided with Vaccine Information Sheet and instruction to access the V-Safe system.   [name] was instructed to call 911 with any severe reactions post vaccine: [name] Difficulty breathing  . Swelling of face and throat  . A fast heartbeat  . A bad rash all over body  . Dizziness and weakness   Immunizations Administered    Name Date Dose VIS Date Route   Pfizer COVID-19 Vaccine 05/22/2019 10:36 AM 0.3 mL 01/18/2019 Intramuscular   Manufacturer: [employer], [employer]   Lot: [id number]   NDC: [address]-3

## 2019-06-03 ENCOUNTER — Telehealth: Payer: Self-pay | Admitting: Internal Medicine

## 2019-06-03 NOTE — Telephone Encounter (Signed)
New message:    Pt is calling and stating that her levothyroxine (SYNTHROID) 75 MCG tablet is making her very hungry and she just cant satisfy her appetite. Please advise.

## 2019-06-03 NOTE — Telephone Encounter (Signed)
Pt contacted and stated that she is taking the new dose of levothyroxine and since starting it she has experienced an increase in appetite and states that she is gaining weight.   Please advise if this is an expected side effect or how should pt proceed.

## 2019-06-04 NOTE — Telephone Encounter (Signed)
Pt contacted and informed per PCP that the thyroid supplement should not increase appetite.   Pt has been scheduled for an appointment to further assess the appetite change.

## 2019-06-04 NOTE — Telephone Encounter (Signed)
No, this is not related to the thyroid supplement  Dose she want to try a medication for the increased appetite?

## 2019-06-10 ENCOUNTER — Ambulatory Visit: Payer: BC Managed Care – PPO | Admitting: Family Medicine

## 2019-06-11 ENCOUNTER — Encounter: Payer: Self-pay | Admitting: Internal Medicine

## 2019-06-11 ENCOUNTER — Other Ambulatory Visit: Payer: Self-pay | Admitting: Internal Medicine

## 2019-06-11 ENCOUNTER — Ambulatory Visit (INDEPENDENT_AMBULATORY_CARE_PROVIDER_SITE_OTHER): Payer: BC Managed Care – PPO | Admitting: Internal Medicine

## 2019-06-11 ENCOUNTER — Other Ambulatory Visit: Payer: Self-pay

## 2019-06-11 VITALS — BP 124/84 | HR 65 | Temp 98.0°F | Ht 64.0 in | Wt 217.2 lb

## 2019-06-11 DIAGNOSIS — E034 Atrophy of thyroid (acquired): Secondary | ICD-10-CM

## 2019-06-11 DIAGNOSIS — F322 Major depressive disorder, single episode, severe without psychotic features: Secondary | ICD-10-CM

## 2019-06-11 LAB — TSH: TSH: 2.7 u[IU]/mL (ref 0.35–4.50)

## 2019-06-11 MED ORDER — DULOXETINE HCL 60 MG PO CPEP: 60.0000 mg | ORAL_CAPSULE | Freq: Every day | ORAL | 1 refills | Status: DC

## 2019-06-11 NOTE — Patient Instructions (Signed)
Hypothyroidism  Hypothyroidism is when the thyroid gland does not make enough of certain hormones (it is underactive). The thyroid gland is a small gland located in the lower front part of the neck, just in front of the windpipe (trachea). This gland makes hormones that help control how the body uses food for energy (metabolism) as well as how the heart and brain function. These hormones also play a role in keeping your bones strong. When the thyroid is underactive, it produces too little of the hormones thyroxine (T4) and triiodothyronine (T3). What are the causes? This condition may be caused by:  Hashimoto's disease. This is a disease in which the body's disease-fighting system (immune system) attacks the thyroid gland. This is the most common cause.  Viral infections.  Pregnancy.  Certain medicines.  Birth defects.  Past radiation treatments to the head or neck for cancer.  Past treatment with radioactive iodine.  Past exposure to radiation in the environment.  Past surgical removal of part or all of the thyroid.  Problems with a gland in the center of the brain (pituitary gland).  Lack of enough iodine in the diet. What increases the risk? You are more likely to develop this condition if:  You are female.  You have a family history of thyroid conditions.  You use a medicine called lithium.  You take medicines that affect the immune system (immunosuppressants). What are the signs or symptoms? Symptoms of this condition include:  Feeling as though you have no energy (lethargy).  Not being able to tolerate cold.  Weight gain that is not explained by a change in diet or exercise habits.  Lack of appetite.  Dry skin.  Coarse hair.  Menstrual irregularity.  Slowing of thought processes.  Constipation.  Sadness or depression. How is this diagnosed? This condition may be diagnosed based on:  Your symptoms, your medical history, and a physical exam.  Blood  tests. You may also have imaging tests, such as an ultrasound or MRI. How is this treated? This condition is treated with medicine that replaces the thyroid hormones that your body does not make. After you begin treatment, it may take several weeks for symptoms to go away. Follow these instructions at home:  Take over-the-counter and prescription medicines only as told by your health care provider.  If you start taking any new medicines, tell your health care provider.  Keep all follow-up visits as told by your health care provider. This is important. ? As your condition improves, your dosage of thyroid hormone medicine may change. ? You will need to have blood tests regularly so that your health care provider can monitor your condition. Contact a health care provider if:  Your symptoms do not get better with treatment.  You are taking thyroid replacement medicine and you: ? Sweat a lot. ? Have tremors. ? Feel anxious. ? Lose weight rapidly. ? Cannot tolerate heat. ? Have emotional swings. ? Have diarrhea. ? Feel weak. Get help right away if you have:  Chest pain.  An irregular heartbeat.  A rapid heartbeat.  Difficulty breathing. Summary  Hypothyroidism is when the thyroid gland does not make enough of certain hormones (it is underactive).  When the thyroid is underactive, it produces too little of the hormones thyroxine (T4) and triiodothyronine (T3).  The most common cause is Hashimoto's disease, a disease in which the body's disease-fighting system (immune system) attacks the thyroid gland. The condition can also be caused by viral infections, medicine, pregnancy, or past   radiation treatment to the head or neck.  Symptoms may include weight gain, dry skin, constipation, feeling as though you do not have energy, and not being able to tolerate cold.  This condition is treated with medicine to replace the thyroid hormones that your body does not make. This information  is not intended to replace advice given to you by your health care provider. Make sure you discuss any questions you have with your health care provider. Document Revised: 01/06/2017 Document Reviewed: 01/04/2017 Elsevier Patient Education  2020 Elsevier Inc.  

## 2019-06-11 NOTE — Progress Notes (Signed)
Subjective:  Patient ID: [name], female    DOB: [birth year]  Age: 54 y.o. MRN: [id number]  CC: Hypothyroidism  This visit occurred during the SARS-CoV-2 public health emergency.  Safety protocols were in place, including screening questions prior to the visit, additional usage of staff PPE, and extensive cleaning of exam room while observing appropriate contact time as indicated for disinfecting solutions.    HPI [name] presents for f/up - She complains of uncontrollable appetite and weight gain.  When I last saw her I started to transition off of mirtazapine and onto duloxetine.  She tells me she did not understand the directions and continues to take mirtazapine.  She continues to complain of anhedonia and sadness.  She would like to increase the dose of duloxetine.  Outpatient Medications Prior to Visit  Medication Sig Dispense Refill  . azelastine (OPTIVAR) 0.05 % ophthalmic solution Place 1 drop into both eyes 2 (two) times daily as needed.    . fluticasone (FLONASE) 50 MCG/ACT nasal spray Place 1 spray into both nostrils daily.    [name] levocetirizine (XYZAL) 5 MG tablet Take 5 mg by mouth every evening.    [name] levonorgestrel (MIRENA, 52 MG,) 20 MCG/24HR IUD 1 each by Intrauterine route once.    [name] levothyroxine (SYNTHROID) 75 MCG tablet Take 1 tablet (75 mcg total) by mouth daily before breakfast. 90 tablet 1  . Riboflavin (B-2) 100 MG TABS Take 400 mg by mouth QID. 120 tablet 5  . SAXENDA 18 MG/3ML SOPN INJECT 3 MG INTO THE SKIN DAILY AS DIRECTED 15 mL 1  . SUMAtriptan (IMITREX) 50 MG tablet Take 50 mg by mouth every 2 (two) hours as needed for migraine. May repeat in 2 hours if headache persists or recurs.    . DULoxetine (CYMBALTA) 30 MG capsule Take 1 capsule (30 mg total) by mouth daily. 90 capsule 1  . Insulin Pen Needle (NOVOFINE) 32G X 6 MM MISC Inject 1 Act into the skin daily. 100 each 1  . mirtazapine (REMERON) 15 MG tablet Take 1 tablet (15 mg total) by mouth at  bedtime. 90 tablet 1   No facility-administered medications prior to visit.    ROS Review of Systems  Constitutional: Positive for appetite change and unexpected weight change. Negative for diaphoresis and fatigue.  HENT: Negative.   Eyes: Negative for visual disturbance.  Respiratory: Negative for cough, chest tightness, shortness of breath and wheezing.   Cardiovascular: Negative for leg swelling.  Gastrointestinal: Negative for abdominal pain, constipation, diarrhea, nausea and vomiting.  Endocrine: Negative for cold intolerance.  Genitourinary: Negative.   Musculoskeletal: Negative.   Skin: Negative.  Negative for color change.  Neurological: Negative.  Negative for dizziness, weakness and light-headedness.  Hematological: Negative for adenopathy. Does not bruise/bleed easily.  Psychiatric/Behavioral: Negative.     Objective:  BP 124/84 (BP Location: Left Arm, Patient Position: Sitting, Cuff Size: Large)   Pulse 65   Temp 98 F (36.7 C) (Oral)   Ht 5\' 4"  (1.626 m)   Wt 217 lb 4 oz (98.5 kg)   SpO2 98%   BMI 37.29 kg/m   BP Readings from Last 3 Encounters:  06/11/19 124/84  05/03/19 132/80  04/29/19 138/88    Wt Readings from Last 3 Encounters:  06/11/19 217 lb 4 oz (98.5 kg)  05/03/19 204 lb 12.8 oz (92.9 kg)  04/29/19 205 lb (93 kg)    Physical Exam Vitals reviewed.  Constitutional:      Appearance: She  is obese.  HENT:     Nose: Nose normal.  Eyes:     General: No scleral icterus.    Conjunctiva/sclera: Conjunctivae normal.  Cardiovascular:     Rate and Rhythm: Normal rate and regular rhythm.     Heart sounds: No murmur.  Pulmonary:     Effort: Pulmonary effort is normal.     Breath sounds: No stridor. No wheezing, rhonchi or rales.  Abdominal:     General: Abdomen is protuberant. Bowel sounds are normal. There is no distension.     Palpations: Abdomen is soft. There is no hepatomegaly, splenomegaly or mass.     Tenderness: There is no abdominal  tenderness.  Musculoskeletal:        General: Normal range of motion.     Cervical back: Neck supple.     Right lower leg: No edema.     Left lower leg: No edema.  Lymphadenopathy:     Cervical: No cervical adenopathy.  Skin:    General: Skin is warm and dry.     Coloration: Skin is not pale.  Neurological:     General: No focal deficit present.     Mental Status: She is alert.  Psychiatric:        Mood and Affect: Mood normal.        Behavior: Behavior normal.     Lab Results  Component Value Date   WBC 8.9 04/29/2019   HGB 14.2 04/29/2019   HCT 42.0 04/29/2019   PLT 268.0 04/29/2019   GLUCOSE 89 04/29/2019   CHOL 180 04/29/2019   TRIG 112.0 04/29/2019   HDL 51.20 04/29/2019   LDLDIRECT 131.0 01/18/2018   LDLCALC 106 (H) 04/29/2019   ALT 17 04/29/2019   AST 17 04/29/2019   NA 139 04/29/2019   K 3.9 04/29/2019   CL 104 04/29/2019   CREATININE 0.88 04/29/2019   BUN 14 04/29/2019   CO2 29 04/29/2019   TSH 2.70 06/11/2019   HGBA1C 5.2 04/29/2019    No results found.  Assessment & Plan:   [name] was seen today for hypothyroidism.  Diagnoses and all orders for this visit:  Hypothyroidism due to acquired atrophy of thyroid- Her TSH is in the normal range.  She will remain on the current dose of levothyroxine. -     TSH; Future -     TSH  Severe major depression ([name])- I think the weight gain and increased appetite are due to mirtazapine.  She agrees to stop taking it.  Will increase the dose of duloxetine. -     DULoxetine (CYMBALTA) 60 MG capsule; Take 1 capsule (60 mg total) by mouth daily.   I have discontinued [name] "[name]"'s mirtazapine and DULoxetine. I am also having her start on DULoxetine. Additionally, I am having her maintain her B-2, Mirena (52 MG), SUMAtriptan, fluticasone, azelastine, levocetirizine, levothyroxine, and Saxenda.  Meds ordered this encounter  Medications  . DULoxetine (CYMBALTA) 60 MG capsule    Sig: Take 1 capsule  (60 mg total) by mouth daily.    Dispense:  90 capsule    Refill:  1     Follow-up: Return in about 6 months (around 12/12/2019).  Melanie Calico, MD

## 2019-06-12 ENCOUNTER — Encounter: Payer: Self-pay | Admitting: Internal Medicine

## 2019-07-10 ENCOUNTER — Ambulatory Visit (INDEPENDENT_AMBULATORY_CARE_PROVIDER_SITE_OTHER): Payer: BC Managed Care – PPO | Admitting: Internal Medicine

## 2019-07-10 ENCOUNTER — Encounter: Payer: Self-pay | Admitting: Internal Medicine

## 2019-07-10 ENCOUNTER — Other Ambulatory Visit: Payer: Self-pay

## 2019-07-10 VITALS — BP 120/80 | HR 72 | Temp 97.8°F | Resp 16 | Ht 64.0 in | Wt 210.0 lb

## 2019-07-10 DIAGNOSIS — R233 Spontaneous ecchymoses: Secondary | ICD-10-CM

## 2019-07-10 DIAGNOSIS — R238 Other skin changes: Secondary | ICD-10-CM | POA: Diagnosis not present

## 2019-07-10 LAB — CBC WITH DIFFERENTIAL/PLATELET
Basophils Absolute: 0 10*3/uL (ref 0.0–0.1)
Basophils Relative: 0.4 % (ref 0.0–3.0)
Eosinophils Absolute: 0.1 10*3/uL (ref 0.0–0.7)
Eosinophils Relative: 1.5 % (ref 0.0–5.0)
HCT: 41.2 % (ref 36.0–46.0)
Hemoglobin: 14.1 g/dL (ref 12.0–15.0)
Lymphocytes Relative: 25.6 % (ref 12.0–46.0)
Lymphs Abs: 2 10*3/uL (ref 0.7–4.0)
MCHC: 34.2 g/dL (ref 30.0–36.0)
MCV: 88.2 fl (ref 78.0–100.0)
Monocytes Absolute: 0.6 10*3/uL (ref 0.1–1.0)
Monocytes Relative: 7.3 % (ref 3.0–12.0)
Neutro Abs: 5.2 10*3/uL (ref 1.4–7.7)
Neutrophils Relative %: 65.2 % (ref 43.0–77.0)
Platelets: 251 10*3/uL (ref 150.0–400.0)
RBC: 4.67 Mil/uL (ref 3.87–5.11)
RDW: 13.5 % (ref 11.5–15.5)
WBC: 8 10*3/uL (ref 4.0–10.5)

## 2019-07-10 LAB — HEPATIC FUNCTION PANEL
ALT: 11 U/L (ref 0–35)
AST: 14 U/L (ref 0–37)
Albumin: 4.3 g/dL (ref 3.5–5.2)
Alkaline Phosphatase: 79 U/L (ref 39–117)
Bilirubin, Direct: 0.1 mg/dL (ref 0.0–0.3)
Total Bilirubin: 0.4 mg/dL (ref 0.2–1.2)
Total Protein: 6.7 g/dL (ref 6.0–8.3)

## 2019-07-10 LAB — PROTIME-INR
INR: 0.9 ratio (ref 0.8–1.0)
Prothrombin Time: 10.5 s (ref 9.6–13.1)

## 2019-07-10 LAB — APTT: aPTT: 31.2 s (ref 23.4–32.7)

## 2019-07-10 NOTE — Patient Instructions (Signed)
Contusion A contusion is a deep bruise. This is a result of an injury that causes bleeding under the skin. Symptoms of bruising include pain, swelling, and discolored skin. The skin may turn blue, purple, or yellow. Follow these instructions at home: Managing pain, stiffness, and swelling You may use RICE. This stands for:  Resting.  Icing.  Compression, or putting pressure.  Elevating, or raising the injured area. To follow this method, do these actions:  Rest the injured area.  If told, put ice on the injured area. ? Put ice in a plastic bag. ? Place a towel between your skin and the bag. ? Leave the ice on for 20 minutes, 2-3 times per day.  If told, put light pressure (compression) on the injured area using an elastic bandage. Make sure the bandage is not too tight. If the area tingles or becomes numb, remove it and put it back on as told by your doctor.  If possible, raise (elevate) the injured area above the level of your heart while you are sitting or lying down.  General instructions  Take over-the-counter and prescription medicines only as told by your doctor.  Keep all follow-up visits as told by your doctor. This is important. Contact a doctor if:  Your symptoms do not get better after several days of treatment.  Your symptoms get worse.  You have trouble moving the injured area. Get help right away if:  You have very bad pain.  You have a loss of feeling (numbness) in a hand or foot.  Your hand or foot turns pale or cold. Summary  A contusion is a deep bruise. This is a result of an injury that causes bleeding under the skin.  Symptoms of bruising include pain, swelling, and discolored skin. The skin may turn blue, purple, or yellow.  This condition is treated with rest, ice, compression, and elevation. This is also called RICE. You may be given over-the-counter medicines for pain.  Contact a doctor if you do not feel better, or you feel worse. Get  help right away if you have very bad pain, have lost feeling in a hand or foot, or the area turns pale or cold. This information is not intended to replace advice given to you by your health care provider. Make sure you discuss any questions you have with your health care provider. Document Revised: 09/15/2017 Document Reviewed: 09/15/2017 Elsevier Patient Education  2020 Elsevier Inc.  

## 2019-07-10 NOTE — Progress Notes (Signed)
Subjective:  Patient ID: [name], female    DOB: [birth year]  Age: 54 y.o. MRN: [id number]  CC: Bleeding/Bruising  This visit occurred during the SARS-CoV-2 public health emergency.  Safety protocols were in place, including screening questions prior to the visit, additional usage of staff PPE, and extensive cleaning of exam room while observing appropriate contact time as indicated for disinfecting solutions.    HPI [name] presents for f/up -she complains of a 1 month history of bruising.  She has noticed asymptomatic bruises on her torso and extremities.  The bruises occur at sites where she has been doing some friendly wrestling with her son and where her puppy has encountered her.  She takes Motrin intermittently.  She denies nosebleeds, hematuria, or bloody stool.  Outpatient Medications Prior to Visit  Medication Sig Dispense Refill  . azelastine (OPTIVAR) 0.05 % ophthalmic solution Place 1 drop into both eyes 2 (two) times daily as needed.    . DULoxetine (CYMBALTA) 60 MG capsule Take 1 capsule (60 mg total) by mouth daily. 90 capsule 1  . fluticasone (FLONASE) 50 MCG/ACT nasal spray Place 1 spray into both nostrils daily.    [name] levocetirizine (XYZAL) 5 MG tablet Take 5 mg by mouth every evening.    [name] levonorgestrel (MIRENA, 52 MG,) 20 MCG/24HR IUD 1 each by Intrauterine route once.    [name] levothyroxine (SYNTHROID) 75 MCG tablet Take 1 tablet (75 mcg total) by mouth daily before breakfast. 90 tablet 1  . NOVOFINE 32G X 6 MM MISC USE AS DIRECTED ONCE DAILY WITH INSULIN PEN 100 each 1  . Riboflavin (B-2) 100 MG TABS Take 400 mg by mouth QID. 120 tablet 5  . SAXENDA 18 MG/3ML SOPN INJECT 3 MG INTO THE SKIN DAILY AS DIRECTED 15 mL 1  . SUMAtriptan (IMITREX) 50 MG tablet Take 50 mg by mouth every 2 (two) hours as needed for migraine. May repeat in 2 hours if headache persists or recurs.     No facility-administered medications prior to visit.    ROS Review of  Systems  Constitutional: Negative.  Negative for chills, diaphoresis, fatigue and fever.  HENT: Negative.  Negative for sore throat and trouble swallowing.   Eyes: Negative.   Respiratory: Negative for cough, chest tightness and shortness of breath.   Cardiovascular: Negative for chest pain, palpitations and leg swelling.  Gastrointestinal: Negative for abdominal pain, anal bleeding, blood in stool, constipation, diarrhea, nausea and vomiting.  Endocrine: Negative.   Genitourinary: Negative.  Negative for difficulty urinating, hematuria and urgency.  Musculoskeletal: Positive for arthralgias. Negative for myalgias.  Skin: Negative.  Negative for pallor and rash.  Neurological: Negative.  Negative for dizziness, weakness and headaches.  Hematological: Negative for adenopathy. Bruises/bleeds easily.  Psychiatric/Behavioral: Negative.     Objective:  BP 120/80 (BP Location: Left Arm, Patient Position: Sitting, Cuff Size: Large)   Pulse 72   Temp 97.8 F (36.6 C) (Oral)   Resp 16   Ht 5\' 4"  (1.626 m)   Wt 210 lb (95.3 kg)   SpO2 96%   BMI 36.05 kg/m   BP Readings from Last 3 Encounters:  07/10/19 120/80  06/11/19 124/84  05/03/19 132/80    Wt Readings from Last 3 Encounters:  07/10/19 210 lb (95.3 kg)  06/11/19 217 lb 4 oz (98.5 kg)  05/03/19 204 lb 12.8 oz (92.9 kg)    Physical Exam Vitals reviewed.  HENT:     Nose:  Nose normal.     Mouth/Throat:     Mouth: Mucous membranes are moist.  Eyes:     General: No scleral icterus.    Conjunctiva/sclera: Conjunctivae normal.  Cardiovascular:     Rate and Rhythm: Normal rate and regular rhythm.     Heart sounds: No murmur.  Pulmonary:     Effort: Pulmonary effort is normal.     Breath sounds: No stridor. No wheezing, rhonchi or rales.  Abdominal:     General: Abdomen is protuberant. Bowel sounds are normal. There is no distension.     Palpations: Abdomen is soft. There is no hepatomegaly, splenomegaly or mass.    Musculoskeletal:        General: Normal range of motion.     Cervical back: Neck supple.     Right lower leg: No edema.     Left lower leg: No edema.  Lymphadenopathy:     Cervical: No cervical adenopathy.  Skin:    General: Skin is warm and dry.     Coloration: Skin is not pale.     Findings: Bruising and ecchymosis present. No erythema, petechiae or rash.     Comments: There are scattered, benign-appearing ecchymoses on the extremities and torso.  See photos.  Neurological:     General: No focal deficit present.     Mental Status: She is alert.     Lab Results  Component Value Date   WBC 8.9 04/29/2019   HGB 14.2 04/29/2019   HCT 42.0 04/29/2019   PLT 268.0 04/29/2019   GLUCOSE 89 04/29/2019   CHOL 180 04/29/2019   TRIG 112.0 04/29/2019   HDL 51.20 04/29/2019   LDLDIRECT 131.0 01/18/2018   LDLCALC 106 (H) 04/29/2019   ALT 17 04/29/2019   AST 17 04/29/2019   NA 139 04/29/2019   K 3.9 04/29/2019   CL 104 04/29/2019   CREATININE 0.88 04/29/2019   BUN 14 04/29/2019   CO2 29 04/29/2019   TSH 2.70 06/11/2019   HGBA1C 5.2 04/29/2019    No results found.  Assessment & Plan:   [name] was seen today for bleeding/bruising.  Diagnoses and all orders for this visit:  Easy bruising- Labs to screen for secondary causes of ecchymoses are reassuring.  The most likely cause is the antiplatelet effect of ibuprofen and a history of light trauma.  I offered her reassurance and asked her to stop taking ibuprofen to see if the ecchymoses resolve. -     CBC with Differential/Platelet; Future -     Protime-INR; Future -     Hepatic function panel; Future -     APTT; Future   I am having Melanie A. Barona "Jenn" maintain her B-2, Mirena (52 MG), SUMAtriptan, fluticasone, azelastine, levocetirizine, levothyroxine, Saxenda, DULoxetine, and NovoFine.  No orders of the defined types were placed in this encounter.    Follow-up: No follow-ups on file.  Sanda Linger, MD

## 2019-07-12 ENCOUNTER — Other Ambulatory Visit: Payer: Self-pay

## 2019-07-12 ENCOUNTER — Ambulatory Visit: Payer: BC Managed Care – PPO | Admitting: Family Medicine

## 2019-07-12 ENCOUNTER — Encounter: Payer: Self-pay | Admitting: Family Medicine

## 2019-07-12 ENCOUNTER — Ambulatory Visit: Payer: Self-pay

## 2019-07-12 VITALS — BP 106/80 | HR 82 | Ht 64.0 in | Wt 211.0 lb

## 2019-07-12 DIAGNOSIS — M25511 Pain in right shoulder: Secondary | ICD-10-CM

## 2019-07-12 NOTE — Patient Instructions (Signed)
Follow up in 6 weeks as needed

## 2019-07-12 NOTE — Progress Notes (Signed)
   I, [name], LAT, ATC, am serving as scribe for Dr. [name].  [name] is a 54 y.o. female who presents to [facility] today for f/u of R ant shoulder pain after falling out of bed.  She was last seen by Dr. [name] on 05/03/19 and was referred to outpatient PT but did not attend due to high co-pay.  Since her last visit, pt reports she is not doing well. Was feeling really good at the beginning of May. 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   Diagnostic testing: R shoulder XR- 04/29/19   Pertinent review of systems: No fevers or chills  Relevant historical information: IBS, interstitial cystitis, depression   Exam:  BP 106/80 (BP Location: Left Arm, Patient Position: Sitting)   Pulse 82   Ht 5\' 4"  (1.626 m)   Wt 211 lb (95.7 kg)   SpO2 98%   BMI 36.22 kg/m  General: Well Developed, well nourished, and in no acute distress.   MSK: Right shoulder normal-appearing Not particularly tender palpation. Range of motion abduction 120 degrees.  Internal rotation iliac crest, external rotation full. Strength abduction 4/5.  External rotation 4/5.  Internal rotation 4/5. Positive Hawkins and Neer's test.  Positive empty can test. Negative Yergason's and speeds test. Pulses cap refill and sensation are intact distally.   Lab and Radiology Results  Diagnostic Limited MSK Ultrasound of: Right shoulder Biceps tendon additional clip visualized in bicipital groove. Subscapularis tendon intact and small amount of hypoechoic fluid superficial to the subscap tendon. Supraspinatus tendon intact with increased subacromial bursa thickness. Infraspinatus tendon intact. AC joint mild effusion. Impression: Subacromial bursitis     Assessment and Plan: 54 y.o. female with right shoulder pain ongoing for over 3 months now.  Initially did well with a bit of conservative  management but had reexacerbation while riding a roller coaster.  Repeat ultrasound today is reassuring with bursitis asmain finding.  Plan for subacromial injection as above.  Plan for home exercise teaching performed in clinic today by ATC and recheck in 6 weeks if not improved.  Consider repeat injection or MRI as next step if not better.    Orders Placed This Encounter  Procedures  . 44 LIMITED JOINT SPACE STRUCTURES UP RIGHT(NO LINKED CHARGES)    Order Specific Question:   Reason for Exam (SYMPTOM  OR DIAGNOSIS REQUIRED)    Answer:   R shoulder pain    Order Specific Question:   Preferred imaging location?    Answer:   [facility]   No orders of the defined types were placed in this encounter.    Discussed warning signs or symptoms. Please see discharge instructions. Patient expresses understanding.   The above documentation has been reviewed and is accurate and complete US, M.D.

## 2019-07-29 ENCOUNTER — Encounter: Payer: Self-pay | Admitting: Internal Medicine

## 2019-08-05 ENCOUNTER — Other Ambulatory Visit: Payer: Self-pay

## 2019-08-05 ENCOUNTER — Encounter: Payer: Self-pay | Admitting: Family

## 2019-08-05 ENCOUNTER — Ambulatory Visit: Payer: BC Managed Care – PPO | Admitting: Family

## 2019-08-05 VITALS — BP 116/72 | HR 73 | Temp 98.2°F | Ht 64.0 in | Wt 212.0 lb

## 2019-08-05 DIAGNOSIS — L259 Unspecified contact dermatitis, unspecified cause: Secondary | ICD-10-CM | POA: Diagnosis not present

## 2019-08-05 MED ORDER — METHYLPREDNISOLONE ACETATE 40 MG/ML IJ SUSP
40.0000 mg | Freq: Once | INTRAMUSCULAR | Status: AC
  Administered 2019-08-05: 40 mg via INTRAMUSCULAR

## 2019-08-05 MED ORDER — HYDROXYZINE HCL 10 MG PO TABS
10.0000 mg | ORAL_TABLET | Freq: Three times a day (TID) | ORAL | 0 refills | Status: DC | PRN
Start: 2019-08-05 — End: 2019-11-18

## 2019-08-05 MED ORDER — FAMOTIDINE 20 MG PO TABS
20.0000 mg | ORAL_TABLET | Freq: Two times a day (BID) | ORAL | 0 refills | Status: DC
Start: 2019-08-05 — End: 2019-11-18

## 2019-08-05 NOTE — Patient Instructions (Signed)
You can try topical OTC Sarna to use at night on the legs as well;

## 2019-08-05 NOTE — Addendum Note (Signed)
Addended by: Nena Alexander C on: 08/05/2019 11:00 AM   Modules accepted: Orders

## 2019-08-05 NOTE — Progress Notes (Signed)
[name] is a 54 y.o. female with the following history as recorded in EpicCare:  Patient Active Problem List   Diagnosis Date Noted  . Easy bruising 07/10/2019  . Irritable bowel syndrome 04/29/2019  . Endometriosis of pelvis 04/29/2019  . Eczema 04/29/2019  . Chronic interstitial cystitis 04/29/2019  . Chronic depression 04/29/2019  . Pure hypertriglyceridemia 08/29/2018  . Hypothyroidism due to acquired atrophy of thyroid 05/29/2018  . Morbid obesity with BMI of 40.0-44.9, adult (HCC) 01/18/2018  . Obstructive sleep apnea 04/03/2015  . Routine adult health maintenance 11/13/2013  . Encounter for screening mammogram for breast cancer 11/13/2013  . IUD (intrauterine device) in place 12/05/2012  . Migraine headache 05/14/2012  . Severe major depression (HCC) 09/05/2006  . Seasonal and perennial allergic rhinitis 09/05/2006    Current Outpatient Medications  Medication Sig Dispense Refill  . azelastine (OPTIVAR) 0.05 % ophthalmic solution Place 1 drop into both eyes 2 (two) times daily as needed.    . DULoxetine (CYMBALTA) 60 MG capsule Take 1 capsule (60 mg total) by mouth daily. 90 capsule 1  . fluticasone (FLONASE) 50 MCG/ACT nasal spray Place 1 spray into both nostrils daily.    [name] levocetirizine (XYZAL) 5 MG tablet Take 5 mg by mouth every evening.    [name] levonorgestrel (MIRENA, 52 MG,) 20 MCG/24HR IUD 1 each by Intrauterine route once.    [name] levothyroxine (SYNTHROID) 75 MCG tablet Take 1 tablet (75 mcg total) by mouth daily before breakfast. 90 tablet 1  . NOVOFINE 32G X 6 MM MISC USE AS DIRECTED ONCE DAILY WITH INSULIN PEN 100 each 1  . Riboflavin (B-2) 100 MG TABS Take 400 mg by mouth QID. 120 tablet 5  . SAXENDA 18 MG/3ML SOPN INJECT 3 MG INTO THE SKIN DAILY AS DIRECTED 15 mL 1  . SUMAtriptan (IMITREX) 50 MG tablet Take 50 mg by mouth every 2 (two) hours as needed for migraine. May repeat in 2 hours if headache persists or recurs.    . famotidine (PEPCID) 20 MG tablet  Take 1 tablet (20 mg total) by mouth 2 (two) times daily. 30 tablet 0  . hydrOXYzine (ATARAX/VISTARIL) 10 MG tablet Take 1 tablet (10 mg total) by mouth 3 (three) times daily as needed. 30 tablet 0   No current facility-administered medications for this visit.    Allergies: Hydrocodone-homatropine, Omnicef [cefdinir], Sulfa antibiotics, and Nickel  Past Medical History:  Diagnosis Date  . Complication of anesthesia    SLOW TO AWAKE  . Depression   . Eczema   . Frequency of urination   . History of kidney stones   . Hypothyroidism    followed by pcp  . Migraines   . Mild sleep apnea    per pt had study in epic 06-13-2015 no cpap recommendation  . Nocturia   . OA (osteoarthritis)    low back  . PONV (postoperative nausea and vomiting)   . Right ureteral calculus   . Seasonal and perennial allergic rhinitis     Past Surgical History:  Procedure Laterality Date  . APPENDECTOMY    . CYSTOSCOPY W/ URETERAL STENT PLACEMENT Right 12/24/2018   Procedure: CYSTOSCOPY WITH RETROGRADE PYELOGRAM/URETERAL STENT PLACEMENT;  Surgeon: Marcine Matar, MD;  Location: [facility];  Service: Urology;  Laterality: Right;  30 MINS  . CYSTOSCOPY WITH RETROGRADE PYELOGRAM, URETEROSCOPY AND STENT PLACEMENT Right 03/29/2019   Procedure: CYSTOSCOPY WITH RETROGRADE PYELOGRAM, URETEROSCOPY AND STENT PLACEMENT;  Surgeon: Marcine Matar, MD;  [facility];  Service: Urology;  Laterality: Right;  1 HR  . EXTRACORPOREAL SHOCK WAVE LITHOTRIPSY Right 12/27/2018   Procedure: EXTRACORPOREAL SHOCK WAVE LITHOTRIPSY (ESWL);  Surgeon: Crist Fat, MD;  Location: [facility];  Service: Urology;  Laterality: Right;  . laporoscopy    . LITHOTRIPSY    . WISDOM TOOTH EXTRACTION    . WRIST SURGERY Right    CYST REMOVED    Family History  Problem Relation Age of Onset  . Diabetes Mother   . Alcohol abuse Mother   . Diabetes Father   . Heart disease Father   . Alcohol abuse Father   .  Cancer Neg Hx   . Stroke Neg Hx   . Hyperlipidemia Neg Hx   . Hypertension Neg Hx   . Kidney disease Neg Hx   . Arthritis Neg Hx     Social History   Tobacco Use  . Smoking status: Never Smoker  . Smokeless tobacco: Never Used  Substance Use Topics  . Alcohol use: Not Currently    Subjective:  Rash on back of legs x 2 weeks; no specific triggers; "itchy" especially at night; denies any new soaps, foods, detergents or medications. Does notes that her dog likes to cuddle up behind her legs and wonders about a "heat rash" from his fur;   Objective:  Vitals:   08/05/19 0950  BP: 116/72  Pulse: 73  Temp: 98.2 F (36.8 C)  TempSrc: Oral  SpO2: 97%  Weight: 212 lb (96.2 kg)  Height: 5\' 4"  (1.626 m)    General: Well developed, well nourished, in no acute distress  Skin : Warm and dry. Macular, erythematous patches noted on back of calf/ thighs ( bilateral); no streaking or warmth noted Head: Normocephalic and atraumatic  Lungs: Respirations unlabored;  Neurologic: Alert and oriented; speech intact; face symmetrical; moves all extremities well; CNII-XII intact without focal deficit   Assessment:  1. Contact dermatitis, unspecified contact dermatitis type, unspecified trigger     Plan:  Depo-Medrol IM 40 mg given; patient defers oral steroids today; trial of Atarax and Pepcid for antihistamine benefit; can also use OTC Sarna for symptom relief; Follow-up worse, no better.  This visit occurred during the SARS-CoV-2 public health emergency.  Safety protocols were in place, including screening questions prior to the visit, additional usage of staff PPE, and extensive cleaning of exam room while observing appropriate contact time as indicated for disinfecting solutions.     No follow-ups on file.  No orders of the defined types were placed in this encounter.   Requested Prescriptions   Signed Prescriptions Disp Refills  . hydrOXYzine (ATARAX/VISTARIL) 10 MG tablet 30 tablet 0     Sig: Take 1 tablet (10 mg total) by mouth 3 (three) times daily as needed.  . famotidine (PEPCID) 20 MG tablet 30 tablet 0    Sig: Take 1 tablet (20 mg total) by mouth 2 (two) times daily.

## 2019-08-23 ENCOUNTER — Ambulatory Visit: Payer: BC Managed Care – PPO | Admitting: Family Medicine

## 2019-08-26 ENCOUNTER — Ambulatory Visit: Payer: BC Managed Care – PPO | Admitting: Family Medicine

## 2019-08-26 ENCOUNTER — Other Ambulatory Visit: Payer: Self-pay

## 2019-08-26 ENCOUNTER — Encounter: Payer: Self-pay | Admitting: Family Medicine

## 2019-08-26 VITALS — BP 118/70 | HR 64 | Ht 64.0 in | Wt 213.0 lb

## 2019-08-26 DIAGNOSIS — M25511 Pain in right shoulder: Secondary | ICD-10-CM

## 2019-08-26 NOTE — Progress Notes (Signed)
   I, [name], LAT, ATC, am serving as scribe for Dr. Clementeen [name].  [name] is a 54 y.o. female who presents to [facility] today for f/u R shoulder pain.  She was last seen by Dr. Denyse [name] on 07/12/19 and had a subacromial injection.  Since her last visit, pt reports she was feeling great until her dog pulled it this past weekend. Pain radiates down the posterior arm.  She notes the pain is improved a little bit already.  Diagnostic testing: R shoulder XR- 04/29/19  Pertinent review of systems: No fevers or chills  Relevant historical information: Migraines   Exam:  BP 118/70 (BP Location: Left Arm, Patient Position: Sitting)   Pulse 64   Ht 5\' 4"  (1.626 m)   Wt 213 lb (96.6 kg)   SpO2 98%   BMI 36.56 kg/m  General: Well Developed, well nourished, and in no acute distress.   MSK: Right shoulder normal-appearing nontender normal motion.  Strength 5/5 abduction and internal rotation 4/5 external rotation with mild pain.  Negative Hawkins and Neer's test. Negative empty can test.      Assessment and Plan: 54 y.o. female with right shoulder pain exacerbation of pre-existing injury.  Fortunately patient is already improved quite a bit in just a few days.  Plan to continue home exercise program.  If symptoms fail to improve fully will consider steroid injection however last injection was June 4.     Discussed warning signs or symptoms. Please see discharge instructions. Patient expresses understanding.   The above documentation has been reviewed and is accurate and complete 12-27-2001, M.D.

## 2019-08-26 NOTE — Patient Instructions (Signed)
Thank you for coming in today. Continue the home exercises.  Consider some anti-pull dog solution like gentle leader.  If the shoulder does not improve we can do a shot.

## 2019-11-04 ENCOUNTER — Telehealth: Payer: Self-pay | Admitting: Internal Medicine

## 2019-11-04 NOTE — Telephone Encounter (Signed)
     Patient concerned about cost of SAXENDA 20 MG/3ML SOPN, too costly

## 2019-11-18 ENCOUNTER — Ambulatory Visit: Payer: BC Managed Care – PPO | Admitting: Internal Medicine

## 2019-11-18 ENCOUNTER — Other Ambulatory Visit: Payer: Self-pay | Admitting: Family

## 2019-11-18 ENCOUNTER — Other Ambulatory Visit: Payer: Self-pay

## 2019-11-18 ENCOUNTER — Encounter: Payer: Self-pay | Admitting: Internal Medicine

## 2019-11-18 VITALS — BP 126/80 | HR 57 | Temp 98.1°F | Ht 64.0 in | Wt 218.0 lb

## 2019-11-18 DIAGNOSIS — G43009 Migraine without aura, not intractable, without status migrainosus: Secondary | ICD-10-CM | POA: Diagnosis not present

## 2019-11-18 DIAGNOSIS — Z6841 Body Mass Index (BMI) 40.0 and over, adult: Secondary | ICD-10-CM

## 2019-11-18 DIAGNOSIS — Z1231 Encounter for screening mammogram for malignant neoplasm of breast: Secondary | ICD-10-CM | POA: Diagnosis not present

## 2019-11-18 DIAGNOSIS — E034 Atrophy of thyroid (acquired): Secondary | ICD-10-CM | POA: Diagnosis not present

## 2019-11-18 DIAGNOSIS — Z23 Encounter for immunization: Secondary | ICD-10-CM

## 2019-11-18 DIAGNOSIS — Z1159 Encounter for screening for other viral diseases: Secondary | ICD-10-CM

## 2019-11-18 LAB — TSH: TSH: 2.13 u[IU]/mL (ref 0.35–4.50)

## 2019-11-18 MED ORDER — INSULIN PEN NEEDLE 32G X 6 MM MISC: 1.0000 | Freq: Every day | 1 refills | Status: DC

## 2019-11-18 MED ORDER — SAXENDA 18 MG/3ML ~~LOC~~ SOPN: PEN_INJECTOR | SUBCUTANEOUS | 1 refills | Status: DC

## 2019-11-18 MED ORDER — SUMATRIPTAN SUCCINATE 50 MG PO TABS: 50.0000 mg | ORAL_TABLET | ORAL | 3 refills | Status: DC | PRN

## 2019-11-18 NOTE — Patient Instructions (Signed)
Hypothyroidism  Hypothyroidism is when the thyroid gland does not make enough of certain hormones (it is underactive). The thyroid gland is a small gland located in the lower front part of the neck, just in front of the windpipe (trachea). This gland makes hormones that help control how the body uses food for energy (metabolism) as well as how the heart and brain function. These hormones also play a role in keeping your bones strong. When the thyroid is underactive, it produces too little of the hormones thyroxine (T4) and triiodothyronine (T3). What are the causes? This condition may be caused by:  Hashimoto's disease. This is a disease in which the body's disease-fighting system (immune system) attacks the thyroid gland. This is the most common cause.  Viral infections.  Pregnancy.  Certain medicines.  Birth defects.  Past radiation treatments to the head or neck for cancer.  Past treatment with radioactive iodine.  Past exposure to radiation in the environment.  Past surgical removal of part or all of the thyroid.  Problems with a gland in the center of the brain (pituitary gland).  Lack of enough iodine in the diet. What increases the risk? You are more likely to develop this condition if:  You are female.  You have a family history of thyroid conditions.  You use a medicine called lithium.  You take medicines that affect the immune system (immunosuppressants). What are the signs or symptoms? Symptoms of this condition include:  Feeling as though you have no energy (lethargy).  Not being able to tolerate cold.  Weight gain that is not explained by a change in diet or exercise habits.  Lack of appetite.  Dry skin.  Coarse hair.  Menstrual irregularity.  Slowing of thought processes.  Constipation.  Sadness or depression. How is this diagnosed? This condition may be diagnosed based on:  Your symptoms, your medical history, and a physical exam.  Blood  tests. You may also have imaging tests, such as an ultrasound or MRI. How is this treated? This condition is treated with medicine that replaces the thyroid hormones that your body does not make. After you begin treatment, it may take several weeks for symptoms to go away. Follow these instructions at home:  Take over-the-counter and prescription medicines only as told by your health care provider.  If you start taking any new medicines, tell your health care provider.  Keep all follow-up visits as told by your health care provider. This is important. ? As your condition improves, your dosage of thyroid hormone medicine may change. ? You will need to have blood tests regularly so that your health care provider can monitor your condition. Contact a health care provider if:  Your symptoms do not get better with treatment.  You are taking thyroid replacement medicine and you: ? Sweat a lot. ? Have tremors. ? Feel anxious. ? Lose weight rapidly. ? Cannot tolerate heat. ? Have emotional swings. ? Have diarrhea. ? Feel weak. Get help right away if you have:  Chest pain.  An irregular heartbeat.  A rapid heartbeat.  Difficulty breathing. Summary  Hypothyroidism is when the thyroid gland does not make enough of certain hormones (it is underactive).  When the thyroid is underactive, it produces too little of the hormones thyroxine (T4) and triiodothyronine (T3).  The most common cause is Hashimoto's disease, a disease in which the body's disease-fighting system (immune system) attacks the thyroid gland. The condition can also be caused by viral infections, medicine, pregnancy, or past   radiation treatment to the head or neck.  Symptoms may include weight gain, dry skin, constipation, feeling as though you do not have energy, and not being able to tolerate cold.  This condition is treated with medicine to replace the thyroid hormones that your body does not make. This information  is not intended to replace advice given to you by your health care provider. Make sure you discuss any questions you have with your health care provider. Document Revised: 01/06/2017 Document Reviewed: 01/04/2017 Elsevier Patient Education  2020 Elsevier Inc.  

## 2019-11-18 NOTE — Progress Notes (Signed)
Subjective:  Patient ID: [name], female    DOB: [birth year]  Age: 54 y.o. MRN: [id number]  CC: Hypothyroidism  This visit occurred during the SARS-CoV-2 public health emergency.  Safety protocols were in place, including screening questions prior to the visit, additional usage of staff PPE, and extensive cleaning of exam room while observing appropriate contact time as indicated for disinfecting solutions.    HPI [name] presents for f/up - She she had lost weight using Saxenda until about a month ago when she ran out.  She now complains of weight gain, fatigue, and sleep disturbance.  Her headaches are well controlled.  Outpatient Medications Prior to Visit  Medication Sig Dispense Refill  . azelastine (OPTIVAR) 0.05 % ophthalmic solution Place 1 drop into both eyes 2 (two) times daily as needed.    . DULoxetine (CYMBALTA) 60 MG capsule Take 1 capsule (60 mg total) by mouth daily. 90 capsule 1  . levocetirizine (XYZAL) 5 MG tablet Take 5 mg by mouth every evening.    [name] levonorgestrel (MIRENA, 52 MG,) 20 MCG/24HR IUD 1 each by Intrauterine route once.    [name] levothyroxine (SYNTHROID) 75 MCG tablet Take 1 tablet (75 mcg total) by mouth daily before breakfast. 90 tablet 1  . Riboflavin (B-2) 100 MG TABS Take 400 mg by mouth QID. 120 tablet 5  . NOVOFINE 32G X 6 MM MISC USE AS DIRECTED ONCE DAILY WITH INSULIN PEN 100 each 1  . SAXENDA 18 MG/3ML SOPN INJECT 3 MG INTO THE SKIN DAILY AS DIRECTED 15 mL 1  . SUMAtriptan (IMITREX) 50 MG tablet Take 50 mg by mouth every 2 (two) hours as needed for migraine. May repeat in 2 hours if headache persists or recurs.    . fluticasone (FLONASE) 50 MCG/ACT nasal spray Place 1 spray into both nostrils daily. (Patient not taking: Reported on 11/18/2019)    . famotidine (PEPCID) 20 MG tablet Take 1 tablet (20 mg total) by mouth 2 (two) times daily. (Patient not taking: Reported on 11/18/2019) 30 tablet 0  . hydrOXYzine (ATARAX/VISTARIL) 10 MG tablet  Take 1 tablet (10 mg total) by mouth 3 (three) times daily as needed. (Patient not taking: Reported on 11/18/2019) 30 tablet 0   No facility-administered medications prior to visit.    ROS Review of Systems  Constitutional: Positive for fatigue and unexpected weight change (wt gain). Negative for appetite change, chills and diaphoresis.  HENT: Negative.   Eyes: Negative for visual disturbance.  Respiratory: Negative for cough, chest tightness, shortness of breath and wheezing.   Cardiovascular: Negative for chest pain, palpitations and leg swelling.  Gastrointestinal: Negative for abdominal pain, constipation, diarrhea, nausea and vomiting.  Endocrine: Negative for cold intolerance and heat intolerance.  Genitourinary: Negative.  Negative for difficulty urinating.  Musculoskeletal: Negative for arthralgias and myalgias.  Skin: Negative for color change and pallor.  Neurological: Negative.  Negative for dizziness, weakness and light-headedness.  Hematological: Negative for adenopathy. Does not bruise/bleed easily.  Psychiatric/Behavioral: Positive for sleep disturbance. Negative for dysphoric mood. The patient is not nervous/anxious.     Objective:  BP 126/80   Pulse (!) 57   Temp 98.1 F (36.7 C) (Oral)   Ht 5\' 4"  (1.626 m)   Wt 218 lb (98.9 kg)   SpO2 98%   BMI 37.42 kg/m   BP Readings from Last 3 Encounters:  11/18/19 126/80  08/26/19 118/70  08/05/19 116/72    Wt Readings from Last 3 Encounters:  11/18/19 218  lb (98.9 kg)  08/26/19 213 lb (96.6 kg)  08/05/19 212 lb (96.2 kg)    Physical Exam Vitals reviewed.  Constitutional:      Appearance: She is obese.  HENT:     Nose: Nose normal.     Mouth/Throat:     Mouth: Mucous membranes are moist.  Eyes:     General: No scleral icterus.    Conjunctiva/sclera: Conjunctivae normal.  Cardiovascular:     Rate and Rhythm: Normal rate and regular rhythm.     Heart sounds: No murmur heard.   Pulmonary:     Effort:  Pulmonary effort is normal.     Breath sounds: No stridor. No wheezing, rhonchi or rales.  Abdominal:     General: Abdomen is protuberant. Bowel sounds are normal. There is no distension.     Palpations: There is no hepatomegaly, splenomegaly or mass.     Tenderness: There is no abdominal tenderness.  Musculoskeletal:        General: Normal range of motion.     Cervical back: Neck supple.     Right lower leg: No edema.     Left lower leg: No edema.  Lymphadenopathy:     Cervical: No cervical adenopathy.  Skin:    General: Skin is warm and dry.     Coloration: Skin is not pale.  Neurological:     General: No focal deficit present.     Mental Status: She is alert.  Psychiatric:        Mood and Affect: Mood normal.        Behavior: Behavior normal.     Lab Results  Component Value Date   WBC 8.0 07/10/2019   HGB 14.1 07/10/2019   HCT 41.2 07/10/2019   PLT 251.0 07/10/2019   GLUCOSE 89 04/29/2019   CHOL 180 04/29/2019   TRIG 112.0 04/29/2019   HDL 51.20 04/29/2019   LDLDIRECT 131.0 01/18/2018   LDLCALC 106 (H) 04/29/2019   ALT 11 07/10/2019   AST 14 07/10/2019   NA 139 04/29/2019   K 3.9 04/29/2019   CL 104 04/29/2019   CREATININE 0.88 04/29/2019   BUN 14 04/29/2019   CO2 29 04/29/2019   TSH 2.13 11/18/2019   INR 0.9 07/10/2019   HGBA1C 5.2 04/29/2019    No results found.  Assessment & Plan:   Lyliana was seen today for hypothyroidism.  Diagnoses and all orders for this visit:  Hypothyroidism due to acquired atrophy of thyroid- Her TSH is in the normal range.  She will stay on the current dose of levothyroxine. -     TSH; Future -     TSH  Morbid obesity with BMI of 40.0-44.9, adult (HCC)- Will restart the high-dose GLP-1 agonist to help her lose weight. -     Liraglutide -Weight Management (SAXENDA) 18 MG/3ML SOPN; INJECT 3 MG INTO THE SKIN DAILY AS DIRECTED -     Insulin Pen Needle 32G X 6 MM MISC; 1 Act by Does not apply route daily.  Migraine without  aura and without status migrainosus, not intractable -     SUMAtriptan (IMITREX) 50 MG tablet; Take 1 tablet (50 mg total) by mouth every 2 (two) hours as needed for migraine. May repeat in 2 hours if headache persists or recurs.  Need for hepatitis C screening test -     Hepatitis C antibody; Future -     Hepatitis C antibody  Encounter for screening mammogram for breast cancer -  MM DIGITAL SCREENING BILATERAL; Future  Other orders -     Flu Vaccine QUAD 6+ mos PF IM (Fluarix Quad PF)   I have discontinued Victorino Dike A. Caloca "Jenn"'s NovoFine, hydrOXYzine, and famotidine. I have also changed her SUMAtriptan and Saxenda. Additionally, I am having her start on Insulin Pen Needle. Lastly, I am having her maintain her B-2, Mirena (52 MG), fluticasone, azelastine, levocetirizine, levothyroxine, and DULoxetine.  Meds ordered this encounter  Medications  . SUMAtriptan (IMITREX) 50 MG tablet    Sig: Take 1 tablet (50 mg total) by mouth every 2 (two) hours as needed for migraine. May repeat in 2 hours if headache persists or recurs.    Dispense:  10 tablet    Refill:  3  . Liraglutide -Weight Management (SAXENDA) 18 MG/3ML SOPN    Sig: INJECT 3 MG INTO THE SKIN DAILY AS DIRECTED    Dispense:  15 mL    Refill:  1  . Insulin Pen Needle 32G X 6 MM MISC    Sig: 1 Act by Does not apply route daily.    Dispense:  100 each    Refill:  1     Follow-up: Return in about 6 months (around 05/18/2020).  Sanda Linger, MD

## 2019-11-19 LAB — HEPATITIS C ANTIBODY
Hepatitis C Ab: NONREACTIVE
SIGNAL TO CUT-OFF: 0 (ref <1.00)

## 2019-11-21 ENCOUNTER — Other Ambulatory Visit: Payer: BC Managed Care – PPO

## 2019-11-21 ENCOUNTER — Telehealth (INDEPENDENT_AMBULATORY_CARE_PROVIDER_SITE_OTHER): Payer: BC Managed Care – PPO | Admitting: Family Medicine

## 2019-11-21 DIAGNOSIS — R0981 Nasal congestion: Secondary | ICD-10-CM

## 2019-11-21 DIAGNOSIS — R059 Cough, unspecified: Secondary | ICD-10-CM | POA: Diagnosis not present

## 2019-11-21 DIAGNOSIS — Z20822 Contact with and (suspected) exposure to covid-19: Secondary | ICD-10-CM

## 2019-11-21 MED ORDER — BENZONATATE 100 MG PO CAPS: 100.0000 mg | ORAL_CAPSULE | Freq: Three times a day (TID) | ORAL | 0 refills | Status: DC | PRN

## 2019-11-21 NOTE — Addendum Note (Signed)
Addended by: Germain Osgood on: 11/21/2019 07:43 PM   Modules accepted: Orders

## 2019-11-21 NOTE — Patient Instructions (Addendum)
   ---------------------------------------------------------------------------------------------------------------------------      WORK/NCEES TESTING SICK EXCUSE SLIP:  Patient [name],  1965/11/18, was seen for a medical visit today, 11/21/19 . Please excuse from work according to the [facility] guidelines for a COVID like illness. We advise 10 days minimum from the onset of symptoms (11/21/2019) PLUS 1 day of no fever and improved symptoms. Will defer to employer for a sooner return to work if COVID19 testing is negative and the symptoms have resolved. Advise following CDC guidelines.   Sincerely: E-signature: Dr. Kriste Basque, DO [facility] Ph: [phone]   ------------------------------------------------------------------------------------------------------------------------------    -stay home while sick, and if you have COVID19 please stay home for a full 10 days since the onset of symptoms PLUS one day of no fever and feeling better  -[place] COVID19 testing information: [email or web address] OR [phone]  -I sent the medication(s) we discussed to your pharmacy: Meds ordered this encounter  Medications  . benzonatate (TESSALON PERLES) 100 MG capsule    Sig: Take 1 capsule (100 mg total) by mouth 3 (three) times daily as needed.    Dispense:  20 capsule    Refill:  0    -can use tylenol or aleve if needed for fevers, aches and pains per instructions  -can use nasal saline a few times per day if nasal congestion, sometimes a short course of Afrin nasal spray for 3 days can help as well  -stay hydrated, drink plenty of fluids and eat small healthy meals - avoid dairy  -can take 1000 IU Vit D3 and Vit C 500mg  daily per instructions  -follow up with your doctor in 2-3 days unless improving and feeling better  I hope you are feeling better soon! Seek in-person care or a follow up telemedicine visit  promptly if your symptoms worsen, new concerns arise or you are not improving as expected. Call 911 if severe symptoms.

## 2019-11-21 NOTE — Progress Notes (Signed)
Virtual Visit via Video Note  I connected with [name]  on 11/21/19 at 10:40 AM EDT by a video enabled telemedicine application and verified that I am speaking with the correct person using two identifiers.  Location patient: home,  Location provider:work or home office Persons participating in the virtual visit: patient, provider  I discussed the limitations of evaluation and management by telemedicine and the availability of in person appointments. The patient expressed understanding and agreed to proceed.   HPI:  Acute telemedicine visit for Nasal Congestion: -Onset: yesterday -Symptoms include: nasal congestion, laryngitis, cough, PND, tickle in the throat -recent possible COVID19 exposure with a friend 4-5 days ago -Denies: SOB, fevers, NVD, HA, loss of taste or smell, malaise, inability to eat/drink -Has tried: nothing -Pertinent past medical history: see below -Pertinent medication allergies: see allergies -COVID-19 vaccine status: pfizer, 2nd dose  ROS: See pertinent positives and negatives per HPI.  Past Medical History:  Diagnosis Date  . Complication of anesthesia    SLOW TO AWAKE  . Depression   . Eczema   . Frequency of urination   . History of kidney stones   . Hypothyroidism    followed by pcp  . Migraines   . Mild sleep apnea    per pt had study in epic 06-13-2015 no cpap recommendation  . Nocturia   . OA (osteoarthritis)    low back  . PONV (postoperative nausea and vomiting)   . Right ureteral calculus   . Seasonal and perennial allergic rhinitis     Past Surgical History:  Procedure Laterality Date  . APPENDECTOMY    . CYSTOSCOPY W/ URETERAL STENT PLACEMENT Right 12/24/2018   Procedure: CYSTOSCOPY WITH RETROGRADE PYELOGRAM/URETERAL STENT PLACEMENT;  Surgeon: Marcine Matar, MD;  Location: [facility];  Service: Urology;  Laterality: Right;  30 MINS  . CYSTOSCOPY WITH RETROGRADE PYELOGRAM, URETEROSCOPY AND STENT PLACEMENT Right 03/29/2019    Procedure: CYSTOSCOPY WITH RETROGRADE PYELOGRAM, URETEROSCOPY AND STENT PLACEMENT;  Surgeon: Marcine Matar, MD;  Location: [facility];  Service: Urology;  Laterality: Right;  1 HR  . EXTRACORPOREAL SHOCK WAVE LITHOTRIPSY Right 12/27/2018   Procedure: EXTRACORPOREAL SHOCK WAVE LITHOTRIPSY (ESWL);  Surgeon: Crist Fat, MD;  Location: [facility];  Service: Urology;  Laterality: Right;  . laporoscopy    . LITHOTRIPSY    . WISDOM TOOTH EXTRACTION    . WRIST SURGERY Right    CYST REMOVED     Current Outpatient Medications:  .  azelastine (OPTIVAR) 0.05 % ophthalmic solution, Place 1 drop into both eyes 2 (two) times daily as needed., Disp: , Rfl:  .  benzonatate (TESSALON PERLES) 100 MG capsule, Take 1 capsule (100 mg total) by mouth 3 (three) times daily as needed., Disp: 20 capsule, Rfl: 0 .  DULoxetine (CYMBALTA) 60 MG capsule, Take 1 capsule (60 mg total) by mouth daily., Disp: 90 capsule, Rfl: 1 .  fluticasone (FLONASE) 50 MCG/ACT nasal spray, Place 1 spray into both nostrils daily. (Patient not taking: Reported on 11/18/2019), Disp: , Rfl:  .  Insulin Pen Needle 32G X 6 MM MISC, 1 Act by Does not apply route daily., Disp: 100 each, Rfl: 1 .  levocetirizine (XYZAL) 5 MG tablet, Take 5 mg by mouth every evening., Disp: , Rfl:  .  levonorgestrel (MIRENA, 52 MG,) 20 MCG/24HR IUD, 1 each by Intrauterine route once., Disp: , Rfl:  .  levothyroxine (SYNTHROID) 75 MCG tablet, Take 1 tablet (75 mcg total) by mouth daily before breakfast., Disp: 90  tablet, Rfl: 1 .  Liraglutide -Weight Management (SAXENDA) 18 MG/3ML SOPN, INJECT 3 MG INTO THE SKIN DAILY AS DIRECTED, Disp: 15 mL, Rfl: 1 .  Riboflavin (B-2) 100 MG TABS, Take 400 mg by mouth QID., Disp: 120 tablet, Rfl: 5 .  SUMAtriptan (IMITREX) 50 MG tablet, Take 1 tablet (50 mg total) by mouth every 2 (two) hours as needed for migraine. May repeat in 2 hours if headache persists or recurs., Disp: 10 tablet, Rfl:  3  EXAM:  VITALS per patient if applicable:  GENERAL: alert, oriented, appears well and in no acute distress  HEENT: atraumatic, conjunttiva clear, no obvious abnormalities on inspection of external nose and ears  NECK: normal movements of the head and neck  LUNGS: on inspection no signs of respiratory distress, breathing rate appears normal, no obvious gross SOB, gasping or wheezing  CV: no obvious cyanosis  MS: moves all visible extremities without noticeable abnormality  PSYCH/NEURO: pleasant and cooperative, no obvious depression or anxiety, speech and thought processing grossly intact  ASSESSMENT AND PLAN:  Discussed the following assessment and plan:  Nasal congestion  Cough  -we discussed possible serious and likely etiologies, options for evaluation and workup, limitations of telemedicine visit vs in person visit, treatment, treatment risks and precautions. Pt prefers to treat via telemedicine empirically rather than in person at this moment. Query possible COVID19 breakthrough case given possible exposure, [facility] vs other. She opted for covid testing (options discussed), symptomatic home care in the interim, Tessalon for cough and staying home while sick and for a full 10 days if covid test Positive. Advised to contact PCP or do follow up visit if covid testing positive. Discussed potential complications and precautions. Work/School slipped offered: provided in patient instructions   Scheduled follow up with PCP offered: Declined    Advised to seek prompt follow up telemedicine visit or in person care if worsening, new symptoms arise, or if is not improving with treatment. Did let this patient know that I only do telemedicine on Tuesdays and Thursdays for [place]. Advised to schedule follow up visit with PCP or [facility] if any further questions or concerns to avoid delays in care.   I discussed the assessment and treatment plan with the patient. The patient was provided an  opportunity to ask questions and all were answered. The patient agreed with the plan and demonstrated an understanding of the instructions.     Terressa Koyanagi, DO

## 2019-11-22 LAB — SARS-COV-2, NAA 2 DAY TAT

## 2019-11-22 LAB — NOVEL CORONAVIRUS, NAA: SARS-CoV-2, NAA: NOT DETECTED

## 2019-12-02 ENCOUNTER — Telehealth: Payer: Self-pay | Admitting: Internal Medicine

## 2019-12-02 NOTE — Telephone Encounter (Signed)
[employer]eds has been informed that a PA request was not "denied" but we have not received any request for a PA. The rep that I spoke with stated that it was sent this morning and I informed her once if has been received that it would be completed.

## 2019-12-02 NOTE — Telephone Encounter (Signed)
Octavia from Cover[employer]Meds calling inquiring about why the patients prior authorization was denied for this medication  Reference# [id number] Octavia # [id number]

## 2019-12-03 NOTE — Telephone Encounter (Signed)
[facility] called about the PA. Requesting a call back from CMA about PA. [phone]

## 2019-12-03 NOTE — Telephone Encounter (Signed)
Forms were received, completed and faxed back. 

## 2019-12-03 NOTE — Telephone Encounter (Signed)
I have completed the additional information form that was sent over and returned it via fax.

## 2019-12-04 NOTE — Telephone Encounter (Signed)
Per CoverMyMeds/[employer] determination PA has been denied.   Pt does not meet the requirements.   Determination sent to scan.

## 2020-01-06 ENCOUNTER — Other Ambulatory Visit: Payer: Self-pay

## 2020-01-06 ENCOUNTER — Ambulatory Visit: Payer: Self-pay

## 2020-01-06 ENCOUNTER — Ambulatory Visit: Payer: BC Managed Care – PPO | Admitting: Family Medicine

## 2020-01-06 VITALS — BP 126/82 | HR 75 | Ht 64.0 in | Wt 223.3 lb

## 2020-01-06 DIAGNOSIS — G8929 Other chronic pain: Secondary | ICD-10-CM

## 2020-01-06 DIAGNOSIS — M25511 Pain in right shoulder: Secondary | ICD-10-CM

## 2020-01-06 NOTE — Patient Instructions (Signed)
Thank you for coming in today.  Please follow up as needed if not improving.  Call or go to the ER if you develop a large red swollen joint with extreme pain or oozing puss.   Do your Home exercises. View at [email or web address] using code: [id number]

## 2020-01-06 NOTE — Progress Notes (Signed)
I, [name], LAT, ATC, am serving as scribe for Dr. [name].  [name] is a 54 y.o. female who presents to [facility] today for f/u of R shoulder pain.  She was last seen by Dr. [name] on 08/26/19 for a re-injury to her R shoulder when her dog pulled her arm while walking the dog on the leash and pt was advised to cont HEP. Since then, pt reports shoulder pn worsened this past week w/ no particular mechanism. Pt locates pn lateral aspect, but pn is now going into the posterior aspect. Especially pn w/ IR. No UE numbness/tingling noted. Aggravates to sleep on R side.  Diagnostic testing: R shoulder XR- 04/29/19   Pertinent review of systems: No fevers or chills  Relevant historical information: History of depression, obesity, hypothyroidism.  No history of diabetes.   Exam:  BP 126/82 (BP Location: Left Arm, Patient Position: Sitting, Cuff Size: Normal)   Pulse 75   Ht 5\' 4"  (1.626 m)   Wt 223 lb 4.8 oz (101.3 kg)   SpO2 95%   BMI 38.33 kg/m  General: Well Developed, well nourished, and in no acute distress.   MSK: Right shoulder normal-appearing Nontender. Range of motion full external rotation.  Internal rotation to lumbar spine abduction 120 degrees with pain. Strength intact within limits of motion abduction external/internal rotation. Positive Hawkins and Neer's test positive empty can test.  Negative Yergason's and speeds test.    Lab and Radiology Results  Procedure: Real-time Ultrasound Guided Injection of right shoulder posterior aspect subacromial bursa Device: Philips Affiniti 50G Images permanently stored and available for review in PACS Ultrasound examination prior to injection reveals normal-appearing biceps tendon.  Intact rotator cuff tendons.  Bursitis present superficial to subscapularis and at posterior aspect of supraspinatus/superior aspect infraspinatus. Verbal informed consent obtained.  Discussed risks and  benefits of procedure. Warned about infection bleeding damage to structures skin hypopigmentation and fat atrophy among others. Patient expresses understanding and agreement Time-out conducted.   Noted no overlying erythema, induration, or other signs of local infection.   Skin prepped in a sterile fashion.   Local anesthesia: Topical Ethyl chloride.   With sterile technique and under real time ultrasound guidance:  40 mg of Kenalog and 2 mL of Marcaine injected into posterior aspect subacromial bursa. Fluid seen entering the bursa.   Completed without difficulty   Pain moderately resolved suggesting accurate placement of the medication.   Advised to call if fevers/chills, erythema, induration, drainage, or persistent bleeding.   Images permanently stored and available for review in the ultrasound unit.  Impression: Technically successful ultrasound guided injection.     Assessment and Plan: 54 y.o. female with right shoulder pain.  Patient has had intermittent chronic right shoulder pain treated since March of this year.  She was doing well until pain started again recently with no clear explanation.  She does have evidence of bursitis on ultrasound.  Discussed options with patient.  Plan for home exercise program teaching today in clinic with ATC.  Additionally we will proceed with injection.  If not improving consider formal physical therapy or even advanced imaging.    Orders Placed This Encounter  Procedures  . April LIMITED JOINT SPACE STRUCTURES UP RIGHT(NO LINKED CHARGES)    Standing Status:   Future    Number of Occurrences:   1    Standing Expiration Date:   07/05/2020    Order Specific Question:   Reason for  Exam (SYMPTOM  OR DIAGNOSIS REQUIRED)    Answer:   chronic right shoulder pain    Order Specific Question:   Preferred imaging location?    Answer:   [facility]   No orders of the defined types were placed in this encounter.    Discussed  warning signs or symptoms. Please see discharge instructions. Patient expresses understanding.   The above documentation has been reviewed and is accurate and complete [name], M.D.

## 2020-04-16 ENCOUNTER — Telehealth (INDEPENDENT_AMBULATORY_CARE_PROVIDER_SITE_OTHER): Payer: BC Managed Care – PPO | Admitting: Family Medicine

## 2020-04-16 ENCOUNTER — Other Ambulatory Visit: Payer: Self-pay

## 2020-04-16 DIAGNOSIS — R059 Cough, unspecified: Secondary | ICD-10-CM | POA: Diagnosis not present

## 2020-04-16 DIAGNOSIS — R0981 Nasal congestion: Secondary | ICD-10-CM

## 2020-04-16 MED ORDER — BENZONATATE 100 MG PO CAPS: 100.0000 mg | ORAL_CAPSULE | Freq: Three times a day (TID) | ORAL | 0 refills | Status: DC | PRN

## 2020-04-16 MED ORDER — DOXYCYCLINE HYCLATE 100 MG PO TABS
100.0000 mg | ORAL_TABLET | Freq: Two times a day (BID) | ORAL | 0 refills | Status: DC
Start: 2020-04-16 — End: 2020-04-24

## 2020-04-16 NOTE — Patient Instructions (Signed)
   ---------------------------------------------------------------------------------------------------------------------------      WORK SLIP:  Patient [name],  October 30, 1965, was seen for a medical visit today, 04/16/20 . Please excuse from work until symptoms have improved for at least 24 hours.   Sincerely: E-signature: Dr. Kriste Basque, DO [facility] Ph: [phone]   ------------------------------------------------------------------------------------------------------------------------------   HOME CARE TIPS:  Dolores Lory COVID19 testing information: [email or web address] [place] [phone] Most pharmacies also offer testing and home test kits. If you have a positive Covid test, please schedule prompt follow-up video visit with your primary care office or through [facility] health.  -I sent the medication(s) we discussed to your pharmacy: Meds ordered this encounter  Medications  . benzonatate (TESSALON PERLES) 100 MG capsule    Sig: Take 1 capsule (100 mg total) by mouth 3 (three) times daily as needed.    Dispense:  20 capsule    Refill:  0  . doxycycline (VIBRA-TABS) 100 MG tablet    Sig: Take 1 tablet (100 mg total) by mouth 2 (two) times daily.    Dispense:  20 tablet    Refill:  0     -can use nasal saline a few times per day if you have nasal congestion; sometimes  a short course of Afrin nasal spray for 3 days can help with symptoms as well  -stay hydrated, drink plenty of fluids and eat small healthy meals - avoid dairy  -can take 1000 IU ( ) Vit D3 and 100-500 mg of Vit C daily per instructions  -follow up with your doctor in 2-3 days unless improving and feeling better  -stay home while sick, except to seek medical care, and if you have COVID19 ideally it would be best to stay home for a full 10 days since the onset of symptoms PLUS one day of no fever and feeling better. Wear a good mask (such  as N95 or KN95) if around others to reduce the risk of transmission.  It was nice to meet you today, and I really hope you are feeling better soon. I help [place] out with telemedicine visits on Tuesdays and Thursdays and am available for visits on those days. If you have any concerns or questions following this visit please schedule a follow up visit with your Primary Care doctor or seek care at a local urgent care clinic to avoid delays in care.    Seek in person care or schedule a follow up video visit promptly if your symptoms worsen, new concerns arise or you are not improving with treatment. Call 911 and/or seek emergency care if your symptoms are severe or life threatening.

## 2020-04-16 NOTE — Progress Notes (Signed)
Virtual Visit via Video Note  I connected with [name]  on 04/16/20 at  1:00 PM EST by a video enabled telemedicine application and verified that I am speaking with the correct person using two identifiers.  Location patient: home, [place] Location provider:work or home office Persons participating in the virtual visit: patient, provider  I discussed the limitations of evaluation and management by telemedicine and the availability of in person appointments. The patient expressed understanding and agreed to proceed.   HPI:  Acute telemedicine visit for sinus congestion: -Onset: 2 weeks ago, but now worsening the last 2-3 days -Symptoms include: thick nasty sinus congestion, lots of pnd, cough, fever 100.4 last night, some diarrhea the last few days and some body aches -Denies: CP, SOB, NV -Has tried: nyquil -Pertinent past medical history: reviewed in epic, see below -Pertinent medication allergies: omnicef, codeine, sulfa, nickel -COVID-19 vaccine status: had 2 doses of covid vaccine, had flu shot  ROS: See pertinent positives and negatives per HPI.  Past Medical History:  Diagnosis Date  . Complication of anesthesia    SLOW TO AWAKE  . Depression   . Eczema   . Frequency of urination   . History of kidney stones   . Hypothyroidism    followed by pcp  . Migraines   . Mild sleep apnea    per pt had study in epic 06-13-2015 no cpap recommendation  . Nocturia   . OA (osteoarthritis)    low back  . PONV (postoperative nausea and vomiting)   . Right ureteral calculus   . Seasonal and perennial allergic rhinitis     Past Surgical History:  Procedure Laterality Date  . APPENDECTOMY    . CYSTOSCOPY W/ URETERAL STENT PLACEMENT Right 12/24/2018   Procedure: CYSTOSCOPY WITH RETROGRADE PYELOGRAM/URETERAL STENT PLACEMENT;  Surgeon: Marcine Matar, MD;  Location: [facility];  Service: Urology;  Laterality: Right;  30 MINS  . CYSTOSCOPY WITH RETROGRADE PYELOGRAM, URETEROSCOPY AND STENT  PLACEMENT Right 03/29/2019   Procedure: CYSTOSCOPY WITH RETROGRADE PYELOGRAM, URETEROSCOPY AND STENT PLACEMENT;  Surgeon: Marcine Matar, MD;  Location: [facility];  Service: Urology;  Laterality: Right;  1 HR  . EXTRACORPOREAL SHOCK WAVE LITHOTRIPSY Right 12/27/2018   Procedure: EXTRACORPOREAL SHOCK WAVE LITHOTRIPSY (ESWL);  Surgeon: Crist Fat, MD;  Location: [facility];  Service: Urology;  Laterality: Right;  . laporoscopy    . LITHOTRIPSY    . WISDOM TOOTH EXTRACTION    . WRIST SURGERY Right    CYST REMOVED     Current Outpatient Medications:  .  benzonatate (TESSALON PERLES) 100 MG capsule, Take 1 capsule (100 mg total) by mouth 3 (three) times daily as needed., Disp: 20 capsule, Rfl: 0 .  doxycycline (VIBRA-TABS) 100 MG tablet, Take 1 tablet (100 mg total) by mouth 2 (two) times daily., Disp: 20 tablet, Rfl: 0 .  azelastine (OPTIVAR) 0.05 % ophthalmic solution, Place 1 drop into both eyes 2 (two) times daily as needed., Disp: , Rfl:  .  DULoxetine (CYMBALTA) 60 MG capsule, Take 1 capsule (60 mg total) by mouth daily., Disp: 90 capsule, Rfl: 1 .  fluticasone (FLONASE) 50 MCG/ACT nasal spray, Place 1 spray into both nostrils daily. , Disp: , Rfl:  .  Insulin Pen Needle 32G X 6 MM MISC, 1 Act by Does not apply route daily., Disp: 100 each, Rfl: 1 .  levocetirizine (XYZAL) 5 MG tablet, Take 5 mg by mouth every evening., Disp: , Rfl:  .  levonorgestrel (MIRENA, 52 MG,)  20 MCG/24HR IUD, 1 each by Intrauterine route once., Disp: , Rfl:  .  levothyroxine (SYNTHROID) 75 MCG tablet, Take 1 tablet (75 mcg total) by mouth daily before breakfast., Disp: 90 tablet, Rfl: 1 .  Liraglutide -Weight Management (SAXENDA) 18 MG/3ML SOPN, INJECT 3 MG INTO THE SKIN DAILY AS DIRECTED, Disp: 15 mL, Rfl: 1 .  Riboflavin (B-2) 100 MG TABS, Take 400 mg by mouth QID., Disp: 120 tablet, Rfl: 5 .  SUMAtriptan (IMITREX) 50 MG tablet, Take 1 tablet (50 mg total) by mouth every 2 (two) hours as  needed for migraine. May repeat in 2 hours if headache persists or recurs., Disp: 10 tablet, Rfl: 3  EXAM:  VITALS per patient if applicable:  GENERAL: alert, oriented, appears well and in no acute distress  HEENT: atraumatic, conjunttiva clear, no obvious abnormalities on inspection of external nose and ears  NECK: normal movements of the head and neck  LUNGS: on inspection no signs of respiratory distress, breathing rate appears normal, no obvious gross SOB, gasping or wheezing  CV: no obvious cyanosis  MS: moves all visible extremities without noticeable abnormality  PSYCH/NEURO: pleasant and cooperative, no obvious depression or anxiety, speech and thought processing grossly intact  ASSESSMENT AND PLAN:  Discussed the following assessment and plan:  Sinus congestion  Cough  -we discussed possible serious and likely etiologies, options for evaluation and workup, limitations of telemedicine visit vs in person visit, treatment, treatment risks and precautions. Pt prefers to treat via telemedicine empirically rather than in person at this moment.  Possible sinusitis, versus underlying allergies with a new acute viral illness, influenza or Covid.  She did do a Covid test at home which was negative.  Discussed treatment for if influenza, and given out of ideal treatment window, she opted against this.  Discussed potential complications and precautions.  Opted to repeat a Covid test today or tomorrow.  Advised a positive schedule follow-up with her primary care office or [place] to discuss treatment.  She opted to start doxycycline in case this is a sinus infection, particularly if not improving with symptomatic care measures provided in patient instructions.  Tessalon for cough, nasal saline, short course nasal decongestant, fluids and other care measures per PI. Work/School slipped offered: provided in patient instructions   Scheduled follow up with PCP offered: Agrees to schedule  follow-up if needed through her primary care office Advised to seek prompt in person care if worsening, new symptoms arise, or if is not improving with treatment.  Did let this patient know that I only do telemedicine on Tuesdays and Thursdays for [place]. Advised to schedule follow up visit with PCP or [facility] if any further questions or concerns to avoid delays in care.   I discussed the assessment and treatment plan with the patient. The patient was provided an opportunity to ask questions and all were answered. The patient agreed with the plan and demonstrated an understanding of the instructions.     Terressa Koyanagi, DO

## 2020-04-24 ENCOUNTER — Encounter: Payer: Self-pay | Admitting: Internal Medicine

## 2020-04-24 ENCOUNTER — Other Ambulatory Visit: Payer: Self-pay

## 2020-04-24 ENCOUNTER — Ambulatory Visit: Payer: BC Managed Care – PPO | Admitting: Internal Medicine

## 2020-04-24 VITALS — BP 130/90 | HR 76 | Temp 98.0°F | Resp 18 | Ht 64.0 in | Wt 227.2 lb

## 2020-04-24 DIAGNOSIS — J0110 Acute frontal sinusitis, unspecified: Secondary | ICD-10-CM

## 2020-04-24 MED ORDER — AZITHROMYCIN 250 MG PO TABS: ORAL_TABLET | ORAL | 0 refills | Status: DC

## 2020-04-24 MED ORDER — METHYLPREDNISOLONE ACETATE 40 MG/ML IJ SUSP
40.0000 mg | Freq: Once | INTRAMUSCULAR | Status: AC
  Administered 2020-04-24: 40 mg via INTRAMUSCULAR

## 2020-04-24 MED ORDER — PROMETHAZINE-DM 6.25-15 MG/5ML PO SYRP: 5.0000 mL | ORAL_SOLUTION | Freq: Four times a day (QID) | ORAL | 0 refills | Status: DC | PRN

## 2020-04-24 NOTE — Assessment & Plan Note (Signed)
Given depo-medrol 40 mg IM at visit and rx 2 week azithromycin as she has failed 1 week doxycycline already.

## 2020-04-24 NOTE — Patient Instructions (Signed)
We have sent in azithromycin to take 2 pills on day 1, then 1 pill daily on days 2-14.   We have sent in promethazine/dm cough liquid to use up to 4 times a day for cough as needed.

## 2020-04-24 NOTE — Progress Notes (Signed)
   Subjective:   Patient ID: [name], female    DOB: [birth year], 55 y.o.   MRN: [id number]  HPI The patient is a 55 YO female coming in for sinus and cough. Started back in January and worsened in the past 2 weeks. She was seen e-visit and given rx tessalon perles and doxycycline. She took these and they did not help. She denies fevers maybe some chills. Some non-productive cough. Some decreased stamina and mild SOB. Overall not improving but not worsening.   Review of Systems  Constitutional: Positive for chills. Negative for activity change, appetite change, fatigue, fever and unexpected weight change.  HENT: Positive for congestion, postnasal drip, rhinorrhea and sinus pressure. Negative for ear discharge, ear pain, sinus pain, sneezing, sore throat, tinnitus, trouble swallowing and voice change.   Eyes: Negative.   Respiratory: Positive for cough. Negative for chest tightness, shortness of breath and wheezing.   Cardiovascular: Negative.   Gastrointestinal: Negative.   Musculoskeletal: Negative.   Neurological: Negative.     Objective:  Physical Exam Constitutional:      Appearance: She is well-developed.  HENT:     Head: Normocephalic and atraumatic.     Comments: Some frontal sinus tenderness    Nose: Rhinorrhea present.     Mouth/Throat:     Pharynx: No oropharyngeal exudate or posterior oropharyngeal erythema.  Cardiovascular:     Rate and Rhythm: Normal rate and regular rhythm.  Pulmonary:     Effort: Pulmonary effort is normal. No respiratory distress.     Breath sounds: Normal breath sounds. No wheezing or rales.  Abdominal:     General: Bowel sounds are normal. There is no distension.     Palpations: Abdomen is soft.     Tenderness: There is no abdominal tenderness. There is no rebound.  Musculoskeletal:     Cervical back: Normal range of motion.  Skin:    General: Skin is warm and dry.  Neurological:     Mental Status: She is alert and oriented to person,  place, and time.     Coordination: Coordination normal.     Vitals:   04/24/20 1553  BP: 130/90  Pulse: 76  Resp: 18  Temp: 98 F (36.7 C)  TempSrc: Oral  SpO2: 98%  Weight: 227 lb 3.2 oz (103.1 kg)  Height: 5\' 4"  (1.626 m)    This visit occurred during the SARS-CoV-2 public health emergency.  Safety protocols were in place, including screening questions prior to the visit, additional usage of staff PPE, and extensive cleaning of exam room while observing appropriate contact time as indicated for disinfecting solutions.   Assessment & Plan:  Depo-medrol 40 mg IM

## 2020-05-05 ENCOUNTER — Telehealth (INDEPENDENT_AMBULATORY_CARE_PROVIDER_SITE_OTHER): Payer: BC Managed Care – PPO | Admitting: Family Medicine

## 2020-05-05 DIAGNOSIS — R0982 Postnasal drip: Secondary | ICD-10-CM | POA: Diagnosis not present

## 2020-05-05 DIAGNOSIS — K219 Gastro-esophageal reflux disease without esophagitis: Secondary | ICD-10-CM

## 2020-05-05 DIAGNOSIS — R059 Cough, unspecified: Secondary | ICD-10-CM | POA: Diagnosis not present

## 2020-05-05 DIAGNOSIS — R062 Wheezing: Secondary | ICD-10-CM | POA: Diagnosis not present

## 2020-05-05 DIAGNOSIS — R0981 Nasal congestion: Secondary | ICD-10-CM

## 2020-05-05 MED ORDER — ALBUTEROL SULFATE HFA 108 (90 BASE) MCG/ACT IN AERS: 2.0000 | INHALATION_SPRAY | Freq: Four times a day (QID) | RESPIRATORY_TRACT | 0 refills | Status: DC | PRN

## 2020-05-05 NOTE — Patient Instructions (Addendum)
-call the Ear, Nose and Throat specialist today and schedule an evaluation  -start your allergy pill and take once daily  -start Nexium and take once daily for 2 weeks (also see diet for acid reflux below)  -I sent the medication(s) we discussed to your pharmacy: Meds ordered this encounter  Medications  . albuterol (PROAIR HFA) 108 (90 Base) MCG/ACT inhaler    Sig: Inhale 2 puffs into the lungs every 6 (six) hours as needed for wheezing or shortness of breath.    Dispense:  1 each    Refill:  0     I hope you are feeling better soon!  Seek in person care promptly if your symptoms worsen, new concerns arise or you are not improving with treatment.  It was nice to meet you today. I help [place] out with telemedicine visits on Tuesdays and Thursdays and am available for visits on those days. If you have any concerns or questions following this visit please schedule a follow up visit with your Primary Care doctor or seek care at a local urgent care clinic to avoid delays in care.       Food Choices for Gastroesophageal Reflux Disease, Adult When you have gastroesophageal reflux disease (GERD), the foods you eat and your eating habits are very important. Choosing the right foods can help ease your discomfort. Think about working with a food expert (dietitian) to help you make good choices. What are tips for following this plan? Reading food labels  Look for foods that are low in saturated fat. Foods that may help with your symptoms include: ? Foods that have less than 5% of daily value (DV) of fat. ? Foods that have 0 grams of trans fat. Cooking  Do not fry your food.  Cook your food by baking, steaming, grilling, or broiling. These are all methods that do not need a lot of fat for cooking.  To add flavor, try to use herbs that are low in spice and acidity. Meal planning  Choose healthy foods that are low in fat, such as: ? Fruits and vegetables. ? Whole grains. ? Low-fat  dairy products. ? Lean meats, fish, and poultry.  Eat small meals often instead of eating 3 large meals each day. Eat your meals slowly in a place where you are relaxed. Avoid bending over or lying down until 2-3 hours after eating.  Limit high-fat foods such as fatty meats or fried foods.  Limit your intake of fatty foods, such as oils, butter, and shortening.  Avoid the following as told by your doctor: ? Foods that cause symptoms. These may be different for different people. Keep a food diary to keep track of foods that cause symptoms. ? Alcohol. ? Drinking a lot of liquid with meals. ? Eating meals during the 2-3 hours before bed.   Lifestyle  Stay at a healthy weight. Ask your doctor what weight is healthy for you. If you need to lose weight, work with your doctor to do so safely.  Exercise for at least 30 minutes on 5 or more days each week, or as told by your doctor.  Wear loose-fitting clothes.  Do not smoke or use any products that contain nicotine or tobacco. If you need help quitting, ask your doctor.  Sleep with the head of your bed higher than your feet. Use a wedge under the mattress or blocks under the bed frame to raise the head of the bed.  Chew sugar-free gum after meals. What foods  should eat? Eat a healthy, well-balanced diet of fruits, vegetables, whole grains, low-fat dairy products, lean meats, fish, and poultry. Each person is different. Foods that may cause symptoms in one person may not cause any symptoms in another person. Work with your doctor to find foods that are safe for you. The items listed above may not be a complete list of what you can eat and drink. Contact a food expert for more options.   What foods should I avoid? Limiting some of these foods may help in managing the symptoms of GERD. Everyone is different. Talk with a food expert or your doctor to help you find the exact foods to avoid, if any. Fruits Any fruits prepared with added fat. Any  fruits that cause symptoms. For some people, this may include citrus fruits, such as oranges, grapefruit, pineapple, and lemons. Vegetables Deep-fried vegetables. [place] fries. Any vegetables prepared with added fat. Any vegetables that cause symptoms. For some people, this may include tomatoes and tomato products, chili peppers, onions and garlic, and horseradish. Grains Pastries or quick breads with added fat. Meats and other proteins High-fat meats, such as fatty beef or pork, hot dogs, ribs, ham, sausage, salami, and bacon. Fried meat or protein, including fried fish and fried chicken. Nuts and nut butters, in large amounts. Dairy Whole milk and chocolate milk. [place] cream. Cream. Ice cream. Cream cheese. Milkshakes. Fats and oils Butter. Margarine. Shortening. Ghee. Beverages Coffee and tea, with or without caffeine. Carbonated beverages. Sodas. Energy drinks. Fruit juice made with acidic fruits, such as orange or grapefruit. Tomato juice. Alcoholic drinks. Sweets and desserts Chocolate and cocoa. Donuts. Seasonings and condiments Pepper. Peppermint and spearmint. Added salt. Any condiments, herbs, or seasonings that cause symptoms. For some people, this may include curry, hot sauce, or vinegar-based salad dressings. The items listed above may not be a complete list of what you should not eat and drink. Contact a food expert for more options. Questions to ask your doctor Diet and lifestyle changes are often the first steps that are taken to manage symptoms of GERD. If diet and lifestyle changes do not help, talk with your doctor about taking medicines. Where to find more information  International Foundation for Gastrointestinal Disorders: aboutgerd.org Summary  When you have GERD, food and lifestyle choices are very important in easing your symptoms.  Eat small meals often instead of 3 large meals a day. Eat your meals slowly and in a place where you are relaxed.  Avoid bending over  or lying down until 2-3 hours after eating.  Limit high-fat foods such as fatty meats or fried foods. This information is not intended to replace advice given to you by your health care provider. Make sure you discuss any questions you have with your health care provider. Document Revised: 08/05/2019 Document Reviewed: 08/05/2019 Elsevier Patient Education  2021 [employer].

## 2020-05-05 NOTE — Progress Notes (Signed)
Virtual Visit via Video Note  I connected with [name]  on 05/05/20 at 10:40 AM EDT by a video enabled telemedicine application and verified that I am speaking with the correct person using two identifiers.  Location patient: home, [place] Location provider:work or home office Persons participating in the virtual visit: patient, provider  I discussed the limitations of evaluation and management by telemedicine and the availability of in person appointments. The patient expressed understanding and agreed to proceed.   HPI:  Acute telemedicine visit for cough: -Onset: over one month ago -has had several doctor visits already and has tried doxycyline, azithromycin, depo medrol and a cough medication, feels a little wheezy at times -the steroid made her feel better temporarily but caused a migraine, now better -she overall feels well except for the nasal congestion, PND and cough -Symptoms include: nasal congestion, PND and persistent cough -Denies:CP, fever, inability to eat/drink/get out of bed -Pertinent past medical history: father and son have asthma but she never has; she has a history of GERD and this has been worse lately, she also has a history of allergies -Pertinent medication allergies: not taking the flonase - burns her nose, not taking xyzal either -COVID-19 vaccine status: has had 2 doses of covid vaccine -tested negative for covid several times  ROS: See pertinent positives and negatives per HPI.  Past Medical History:  Diagnosis Date  . Complication of anesthesia    SLOW TO AWAKE  . Depression   . Eczema   . Frequency of urination   . History of kidney stones   . Hypothyroidism    followed by pcp  . Migraines   . Mild sleep apnea    per pt had study in epic 06-13-2015 no cpap recommendation  . Nocturia   . OA (osteoarthritis)    low back  . PONV (postoperative nausea and vomiting)   . Right ureteral calculus   . Seasonal and perennial allergic rhinitis     Past  Surgical History:  Procedure Laterality Date  . APPENDECTOMY    . CYSTOSCOPY W/ URETERAL STENT PLACEMENT Right 12/24/2018   Procedure: CYSTOSCOPY WITH RETROGRADE PYELOGRAM/URETERAL STENT PLACEMENT;  Surgeon: Marcine Matar, MD;  Location: [facility];  Service: Urology;  Laterality: Right;  30 MINS  . CYSTOSCOPY WITH RETROGRADE PYELOGRAM, URETEROSCOPY AND STENT PLACEMENT Right 03/29/2019   Procedure: CYSTOSCOPY WITH RETROGRADE PYELOGRAM, URETEROSCOPY AND STENT PLACEMENT;  Surgeon: Marcine Matar, MD;  Location: [facility];  Service: Urology;  Laterality: Right;  1 HR  . EXTRACORPOREAL SHOCK WAVE LITHOTRIPSY Right 12/27/2018   Procedure: EXTRACORPOREAL SHOCK WAVE LITHOTRIPSY (ESWL);  Surgeon: Crist Fat, MD;  Location: [facility];  Service: Urology;  Laterality: Right;  . laporoscopy    . LITHOTRIPSY    . WISDOM TOOTH EXTRACTION    . WRIST SURGERY Right    CYST REMOVED     Current Outpatient Medications:  .  albuterol (PROAIR HFA) 108 (90 Base) MCG/ACT inhaler, Inhale 2 puffs into the lungs every 6 (six) hours as needed for wheezing or shortness of breath., Disp: 1 each, Rfl: 0 .  azelastine (OPTIVAR) 0.05 % ophthalmic solution, Place 1 drop into both eyes 2 (two) times daily as needed., Disp: , Rfl:  .  azithromycin (ZITHROMAX) 250 MG tablet, Day 1 take 2 pills, days 2-14 take 1 pill daily., Disp: 15 tablet, Rfl: 0 .  DULoxetine (CYMBALTA) 60 MG capsule, Take 1 capsule (60 mg total) by mouth daily., Disp: 90 capsule, Rfl: 1 .  fluticasone (FLONASE) 50 MCG/ACT nasal spray, Place 1 spray into both nostrils daily. , Disp: , Rfl:  .  Insulin Pen Needle 32G X 6 MM MISC, 1 Act by Does not apply route daily., Disp: 100 each, Rfl: 1 .  levocetirizine (XYZAL) 5 MG tablet, Take 5 mg by mouth every evening. (Patient not taking: Reported on 04/24/2020), Disp: , Rfl:  .  levonorgestrel (MIRENA, 52 MG,) 20 MCG/24HR IUD, 1 each by Intrauterine route once., Disp: , Rfl:  .   levothyroxine (SYNTHROID) 75 MCG tablet, Take 1 tablet (75 mcg total) by mouth daily before breakfast., Disp: 90 tablet, Rfl: 1 .  Liraglutide -Weight Management (SAXENDA) 18 MG/3ML SOPN, INJECT 3 MG INTO THE SKIN DAILY AS DIRECTED (Patient not taking: Reported on 04/24/2020), Disp: 15 mL, Rfl: 1 .  promethazine-dextromethorphan (PROMETHAZINE-DM) 6.25-15 MG/5ML syrup, Take 5 mLs by mouth 4 (four) times daily as needed for cough., Disp: 118 mL, Rfl: 0 .  Riboflavin (B-2) 100 MG TABS, Take 400 mg by mouth QID., Disp: 120 tablet, Rfl: 5 .  SUMAtriptan (IMITREX) 50 MG tablet, Take 1 tablet (50 mg total) by mouth every 2 (two) hours as needed for migraine. May repeat in 2 hours if headache persists or recurs., Disp: 10 tablet, Rfl: 3  EXAM:  VITALS per patient if applicable:  GENERAL: alert, oriented, appears well and in no acute distress  HEENT: atraumatic, conjunttiva clear, no obvious abnormalities on inspection of external nose and ears  NECK: normal movements of the head and neck  LUNGS: on inspection no signs of respiratory distress, breathing rate appears normal, no obvious gross SOB, gasping or wheezing  CV: no obvious cyanosis  MS: moves all visible extremities without noticeable abnormality  PSYCH/NEURO: pleasant and cooperative, no obvious depression or anxiety, speech and thought processing grossly intact  ASSESSMENT AND PLAN:  Discussed the following assessment and plan:  Cough  Nasal congestion  Wheeze  PND (post-nasal drip)  Gastroesophageal reflux disease, unspecified whether esophagitis present  -we discussed possible serious and likely etiologies, options for evaluation and workup, limitations of telemedicine visit vs in person visit, treatment, treatment risks and precautions. Pt prefers to treat via telemedicine empirically rather than in person at this moment.  She reported a number of factors that could be contributing to the development of a chronic postnasal  drip, sinus ingestion and cough.  Opted to treat the GERD with a daily and acid for 2 weeks and dietary recommendations (provided in patient instructions.)  Advised to get started on a daily allergy pill -she plans to pick up the Xyzal from the store.  Also sent in albuterol inhaler to use if feels we see, for excessive cough or shortness of breath.  Also advised needs an in person evaluation given duration of symptoms not responding to the antibiotics and she plans to schedule an appointment with an ear nose and throat specialist.  Advised if any worsening in the interim or not improving with above measures, any further wheezing/shortness of breath that she seek prompt in person care at the urgent care or with her primary care office. Scheduled follow up with PCP offered: She agrees to schedule follow-up if needed. Advised to seek prompt in person care if worsening, new symptoms arise, or if is not improving with treatment. Discussed options for inperson care if PCP office not available. Did let this patient know that I only do telemedicine on Tuesdays and Thursdays for [place]. Advised to schedule follow up visit with PCP or [facility]  if any further questions or concerns to avoid delays in care.   I discussed the assessment and treatment plan with the patient. The patient was provided an opportunity to ask questions and all were answered. The patient agreed with the plan and demonstrated an understanding of the instructions.     Lucretia Kern, DO

## 2020-08-27 ENCOUNTER — Other Ambulatory Visit: Payer: Self-pay | Admitting: Internal Medicine

## 2020-08-27 ENCOUNTER — Telehealth: Payer: Self-pay | Admitting: Internal Medicine

## 2020-08-27 DIAGNOSIS — J208 Acute bronchitis due to other specified organisms: Secondary | ICD-10-CM

## 2020-08-27 MED ORDER — PAXLOVID 20 X 150 MG & 10 X 100MG PO TBPK: 3.0000 | ORAL_TABLET | Freq: Two times a day (BID) | ORAL | 0 refills | Status: AC

## 2020-08-27 MED ORDER — PROMETHAZINE-DM 6.25-15 MG/5ML PO SYRP
5.0000 mL | ORAL_SOLUTION | Freq: Four times a day (QID) | ORAL | 0 refills | Status: DC | PRN
Start: 2020-08-27 — End: 2020-11-03

## 2020-08-27 MED ORDER — PROMETHAZINE-DM 6.25-15 MG/5ML PO SYRP: 5.0000 mL | ORAL_SOLUTION | Freq: Four times a day (QID) | ORAL | 0 refills | Status: DC | PRN

## 2020-08-27 MED ORDER — PAXLOVID 20 X 150 MG & 10 X 100MG PO TBPK: 3.0000 | ORAL_TABLET | Freq: Two times a day (BID) | ORAL | 0 refills | Status: DC

## 2020-08-27 NOTE — Telephone Encounter (Signed)
Tested positive, covid home test Symptoms started yesterday Daughter tested positive for covid Monday, exposed on Friday week prior  Symptoms: fever (100.7 highest), teeth throbbing, ear throbbing, no taste, smell some, cough almost feels like a really bad sinus infection Throat bothered her yesterday, but not now  [id number] [place], [place] - [address] AT [facility] OF [facility] & [address] Phone:  [phone]  Fax:  [phone]     Please call the patient with next steps

## 2020-11-03 NOTE — Progress Notes (Signed)
Subjective:    Patient ID: [name], female    DOB: 1965/06/08, 55 y.o.   MRN: [id number]  This visit occurred during the SARS-CoV-2 public health emergency.  Safety protocols were in place, including screening questions prior to the visit, additional usage of staff PPE, and extensive cleaning of exam room while observing appropriate contact time as indicated for disinfecting solutions.     HPI The patient is here for follow up of their chronic medical problems, including hypothhyroidism, migraines, depression  She has been out of her Cymbalta for months and wants to restart it.  She has had more bad days than good.  She does still functional, but it is hard.  She has some anxiety.  She feels the Cymbalta worked well for her.  She denies any suicidal ideation.  She is no longer taking her thyroid medication.  She has been going to [facility] weight loss clinic and they have been monitoring it and it is currently in the normal range without medication.  Medications and allergies reviewed with patient and updated if appropriate.  Patient Active Problem List   Diagnosis Date Noted   Irritable bowel syndrome 04/29/2019   Endometriosis of pelvis 04/29/2019   Eczema 04/29/2019   Chronic interstitial cystitis 04/29/2019   Chronic depression 04/29/2019   Pure hypertriglyceridemia 08/29/2018   Hypothyroidism due to acquired atrophy of thyroid 05/29/2018   Morbid obesity with BMI of 40.0-44.9, adult (HCC) 01/18/2018   Obstructive sleep apnea 04/03/2015   Acute sinusitis 06/24/2014   Routine adult health maintenance 11/13/2013   Encounter for screening mammogram for breast cancer 11/13/2013   IUD (intrauterine device) in place 12/05/2012   Migraine headache 05/14/2012   Severe major depression (HCC) 09/05/2006   Seasonal and perennial allergic rhinitis 09/05/2006    Current Outpatient Medications on File Prior to Visit  Medication Sig Dispense Refill   albuterol (PROAIR HFA)  108 (90 Base) MCG/ACT inhaler Inhale 2 puffs into the lungs every 6 (six) hours as needed for wheezing or shortness of breath. 1 each 0   azelastine (OPTIVAR) 0.05 % ophthalmic solution Place 1 drop into both eyes 2 (two) times daily as needed.     DULoxetine (CYMBALTA) 60 MG capsule Take 1 capsule (60 mg total) by mouth daily. 90 capsule 1   fluticasone (FLONASE) 50 MCG/ACT nasal spray Place 1 spray into both nostrils daily.      Insulin Pen Needle 32G X 6 MM MISC 1 Act by Does not apply route daily. 100 each 1   levonorgestrel (MIRENA, 52 MG,) 20 MCG/24HR IUD 1 each by Intrauterine route once.     Riboflavin (B-2) 100 MG TABS Take 400 mg by mouth QID. 120 tablet 5   SAXENDA 18 MG/3ML SOPN Inject 3 mg into the skin daily.     SUMAtriptan (IMITREX) 50 MG tablet Take 1 tablet (50 mg total) by mouth every 2 (two) hours as needed for migraine. May repeat in 2 hours if headache persists or recurs. 10 tablet 3   No current facility-administered medications on file prior to visit.    Past Medical History:  Diagnosis Date   Complication of anesthesia    SLOW TO AWAKE   Depression    Eczema    Frequency of urination    History of kidney stones    Hypothyroidism    followed by pcp   Migraines    Mild sleep apnea    per pt had study in epic  06-13-2015 no cpap recommendation   Nocturia    OA (osteoarthritis)    low back   PONV (postoperative nausea and vomiting)    Right ureteral calculus    Seasonal and perennial allergic rhinitis     Past Surgical History:  Procedure Laterality Date   APPENDECTOMY     CYSTOSCOPY W/ URETERAL STENT PLACEMENT Right 12/24/2018   Procedure: CYSTOSCOPY WITH RETROGRADE PYELOGRAM/URETERAL STENT PLACEMENT;  Surgeon: Marcine Matar, MD;  Location: [facility];  Service: Urology;  Laterality: Right;  30 MINS   CYSTOSCOPY WITH RETROGRADE PYELOGRAM, URETEROSCOPY AND STENT PLACEMENT Right 03/29/2019   Procedure: CYSTOSCOPY WITH RETROGRADE PYELOGRAM, URETEROSCOPY AND  STENT PLACEMENT;  Surgeon: Marcine Matar, MD;  Location: [facility];  Service: Urology;  Laterality: Right;  1 HR   EXTRACORPOREAL SHOCK WAVE LITHOTRIPSY Right 12/27/2018   Procedure: EXTRACORPOREAL SHOCK WAVE LITHOTRIPSY (ESWL);  Surgeon: Crist Fat, MD;  Location: [facility];  Service: Urology;  Laterality: Right;   laporoscopy     LITHOTRIPSY     WISDOM TOOTH EXTRACTION     WRIST SURGERY Right    CYST REMOVED    Social History   Socioeconomic History   Marital status: Divorced    Spouse name: Not on file   Number of children: Not on file   Years of education: Not on file   Highest education level: Not on file  Occupational History   Not on file  Tobacco Use   Smoking status: Never   Smokeless tobacco: Never  Vaping Use   Vaping Use: Never used  Substance and Sexual Activity   Alcohol use: Not Currently   Drug use: Never   Sexual activity: Not Currently    Birth control/protection: I.U.D.  Other Topics Concern   Not on file  Social History Narrative   Not on file   Social Determinants of Health   Financial Resource Strain: Not on file  Food Insecurity: Not on file  Transportation Needs: Not on file  Physical Activity: Not on file  Stress: Not on file  Social Connections: Not on file    Family History  Problem Relation Age of Onset   Diabetes Mother    Alcohol abuse Mother    Diabetes Father    Heart disease Father    Alcohol abuse Father    Cancer Neg Hx    Stroke Neg Hx    Hyperlipidemia Neg Hx    Hypertension Neg Hx    Kidney disease Neg Hx    Arthritis Neg Hx     Review of Systems  Constitutional:  Positive for appetite change (increased - emotional eating).  Respiratory:  Positive for cough (allergies). Negative for shortness of breath and wheezing.   Cardiovascular:  Negative for chest pain and palpitations.  Neurological:  Positive for headaches. Negative for light-headedness.  Psychiatric/Behavioral:  Positive for  dysphoric mood and sleep disturbance (diff falling asleep - getting 5-6 hrs). Negative for suicidal ideas. The patient is nervous/anxious.       Objective:   Vitals:   11/04/20 0907  BP: 128/74  Pulse: 80  Temp: 98.4 F (36.9 C)  SpO2: 97%   BP Readings from Last 3 Encounters:  11/04/20 128/74  04/24/20 130/90  01/06/20 126/82   Wt Readings from Last 3 Encounters:  11/04/20 224 lb (101.6 kg)  04/24/20 227 lb 3.2 oz (103.1 kg)  01/06/20 223 lb 4.8 oz (101.3 kg)   Body mass index is 38.45 kg/m.   Physical Exam  Constitutional: Appears well-developed and well-nourished. No distress.  HENT:  Head: Normocephalic and atraumatic.  Neck: Neck supple. No tracheal deviation present. No thyromegaly present.  No cervical lymphadenopathy Cardiovascular: Normal rate, regular rhythm and normal heart sounds.   No murmur heard. No carotid bruit .  No edema Pulmonary/Chest: Effort normal and breath sounds normal. No respiratory distress. No has no wheezes. No rales.  Skin: Skin is warm and dry. Not diaphoretic.  Psychiatric: Normal mood and affect. Behavior is normal.      Assessment & Plan:    Migraine headaches: Chronic Triggers are weather changes  Continue imitrex 50 mg daily prn  Chronic depression: Chronic She ran out of her Cymbalta several months ago and is been going without it-has had good days and bad days, but more bad days than good.  She denies any suicidal ideation.  She would like to restart it because it really did not help Restart Cymbalta 60 mg daily  Hypothyroidism: Currently not taking any medication Went to [facility] weight loss clinic and they regulated her hormones and has not needed thyroid medication Advised that she continue to have this monitored by them or by [place]

## 2020-11-03 NOTE — Patient Instructions (Addendum)
    Flu immunization administered today.     Medications changes include :   restart cymbalta 60 mg daily  Your prescription(s) have been submitted to your pharmacy. Please take as directed and contact our office if you believe you are having problem(s) with the medication(s).

## 2020-11-04 ENCOUNTER — Encounter: Payer: Self-pay | Admitting: Internal Medicine

## 2020-11-04 ENCOUNTER — Ambulatory Visit: Payer: BC Managed Care – PPO | Admitting: Internal Medicine

## 2020-11-04 ENCOUNTER — Other Ambulatory Visit: Payer: Self-pay

## 2020-11-04 VITALS — BP 128/74 | HR 80 | Temp 98.4°F | Ht 64.0 in | Wt 224.0 lb

## 2020-11-04 DIAGNOSIS — F32A Depression, unspecified: Secondary | ICD-10-CM | POA: Diagnosis not present

## 2020-11-04 DIAGNOSIS — E034 Atrophy of thyroid (acquired): Secondary | ICD-10-CM

## 2020-11-04 DIAGNOSIS — G43009 Migraine without aura, not intractable, without status migrainosus: Secondary | ICD-10-CM

## 2020-11-04 DIAGNOSIS — F322 Major depressive disorder, single episode, severe without psychotic features: Secondary | ICD-10-CM | POA: Diagnosis not present

## 2020-11-04 DIAGNOSIS — Z23 Encounter for immunization: Secondary | ICD-10-CM | POA: Diagnosis not present

## 2020-11-04 MED ORDER — SUMATRIPTAN SUCCINATE 50 MG PO TABS: 50.0000 mg | ORAL_TABLET | ORAL | 3 refills | Status: AC | PRN

## 2020-11-04 MED ORDER — DULOXETINE HCL 60 MG PO CPEP: 60.0000 mg | ORAL_CAPSULE | Freq: Every day | ORAL | 1 refills | Status: DC

## 2020-11-04 NOTE — Assessment & Plan Note (Deleted)
Chronic Triggers are weather changes  Continue imitrex 50 mg daily prn

## 2020-11-04 NOTE — Addendum Note (Signed)
Addended by: Karma Ganja on: 11/04/2020 04:50 PM   Modules accepted: Orders

## 2020-11-11 IMAGING — CT CT RENAL STONE PROTOCOL
2 of 4 series · 16 of 46 positions shown, 18 images · non-contrast
Comparison: None

CLINICAL DATA: RIGHT flank pain since 4344 hours this morning,
history of kidney stones suspected recurrent disease

EXAM:
CT ABDOMEN AND PELVIS WITHOUT CONTRAST
TECHNIQUE: Multidetector CT imaging of the abdomen and pelvis was performed
following the standard protocol without IV contrast. Sagittal and
coronal MPR images reconstructed from axial data set. Oral contrast
was not administered

[Series 3: ap without · axial · non-contrast · 0.84mm/px · z∈[+713,+1143]mm · 13 of 98 slices shown, 15 images]
[im 6/98  soft-tissue]
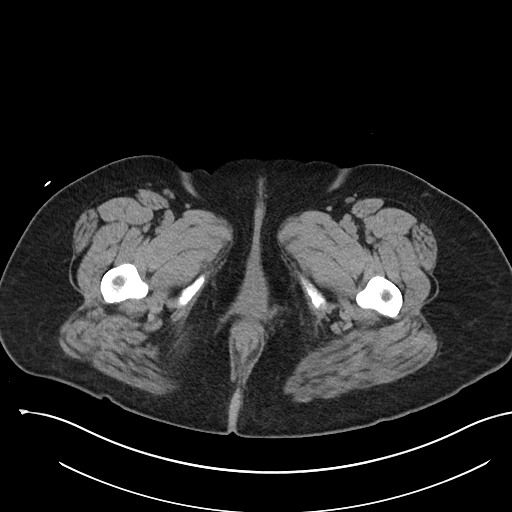
[im 6/98  bone]
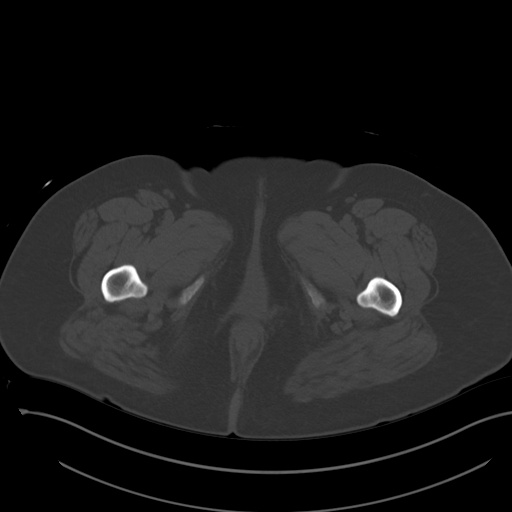
[im 16/98  soft-tissue]
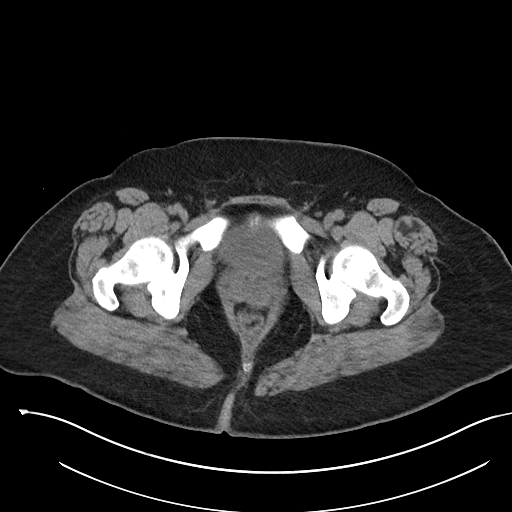
[im 21/98  soft-tissue]
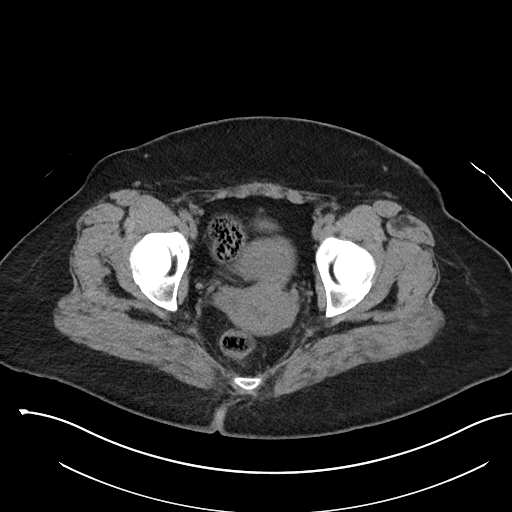
[im 26/98  soft-tissue]
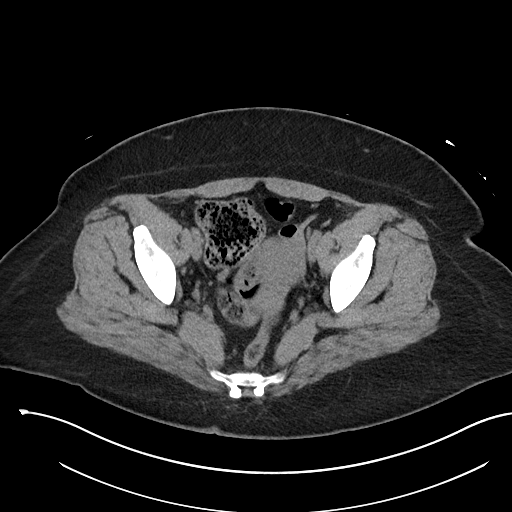
[im 36/98  soft-tissue]
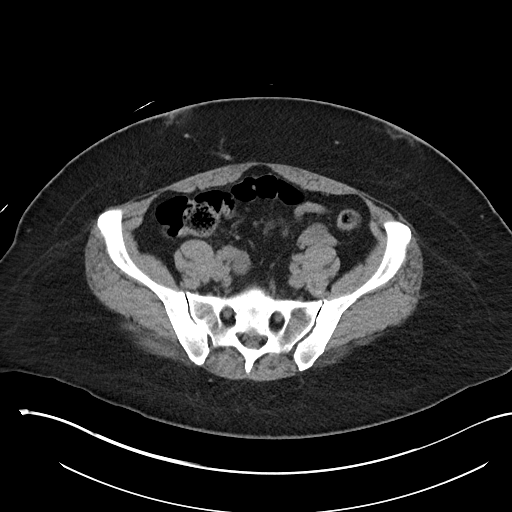
[im 41/98  soft-tissue]
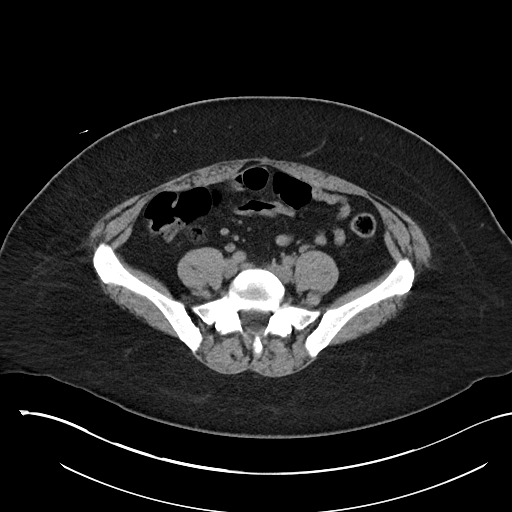
[im 52/98  soft-tissue]
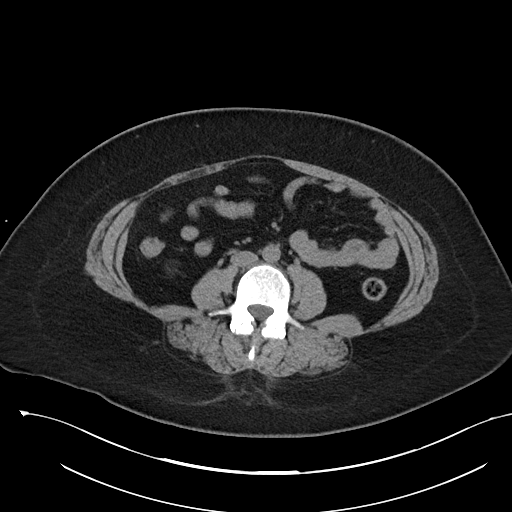
[im 57/98  soft-tissue]
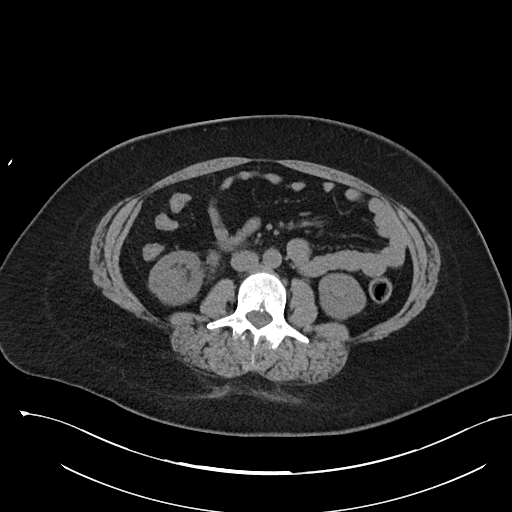
[im 62/98  soft-tissue]
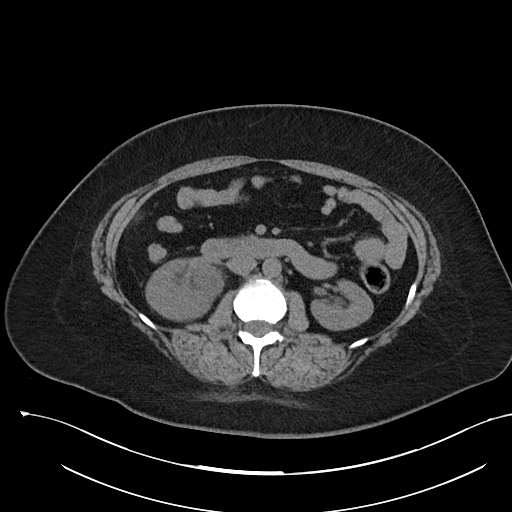
[im 62/98  bone]
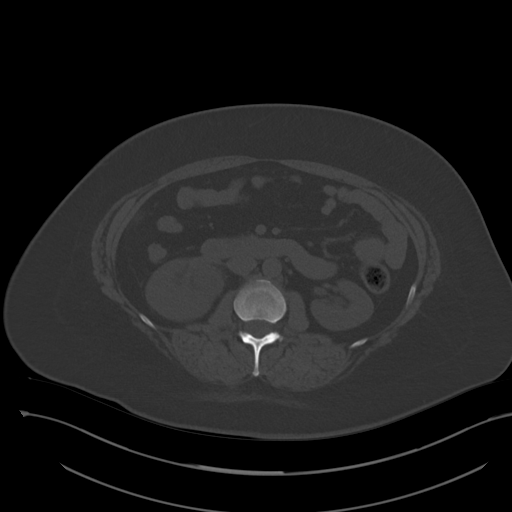
[im 72/98  soft-tissue]
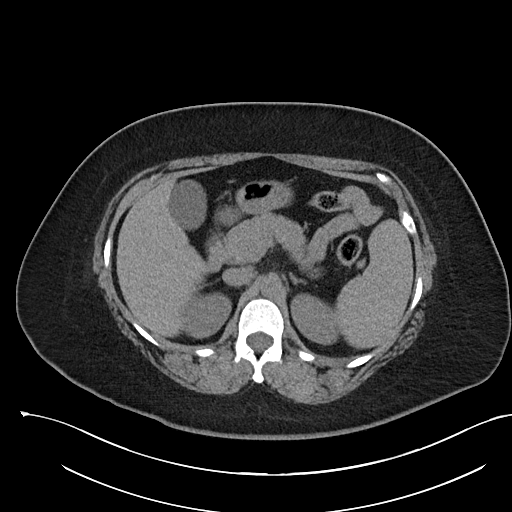
[im 77/98  soft-tissue]
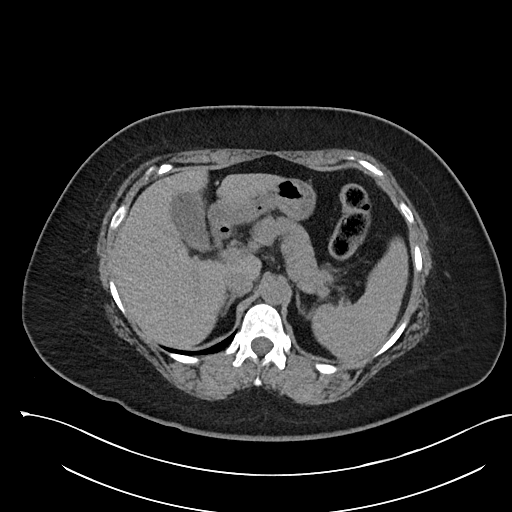
[im 82/98  soft-tissue]
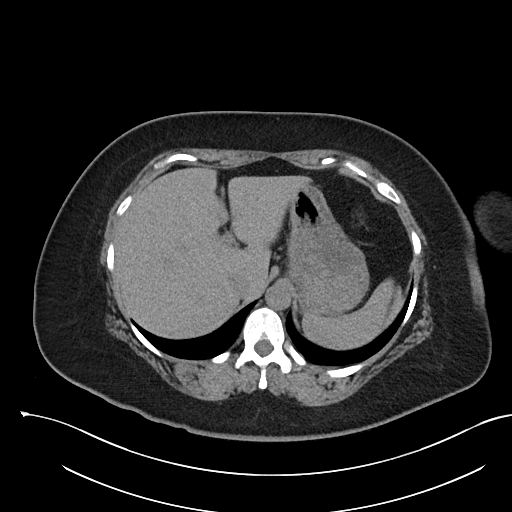
[im 92/98  soft-tissue]
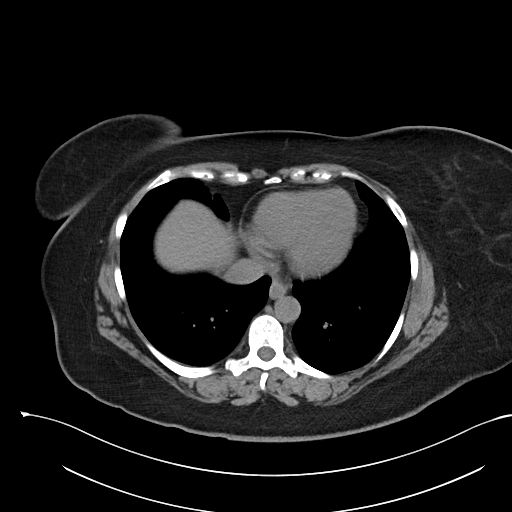

[Series 6: cor · coronal · 0.73mm/px · 3 of 98 slices shown]
[im 33/98  soft-tissue]
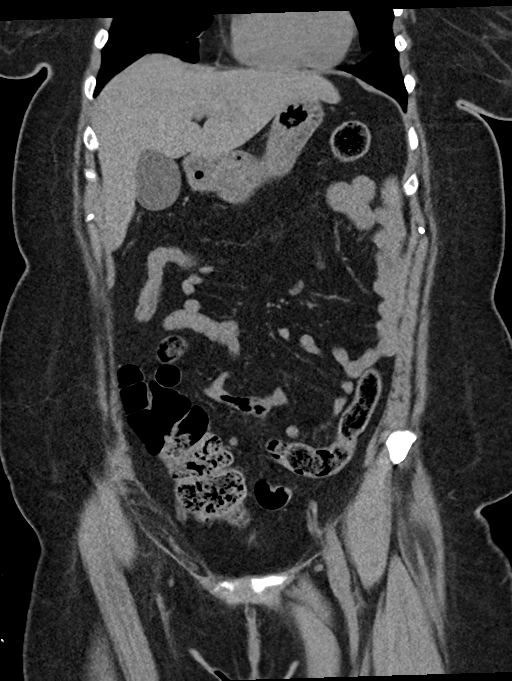
[im 44/98  soft-tissue]
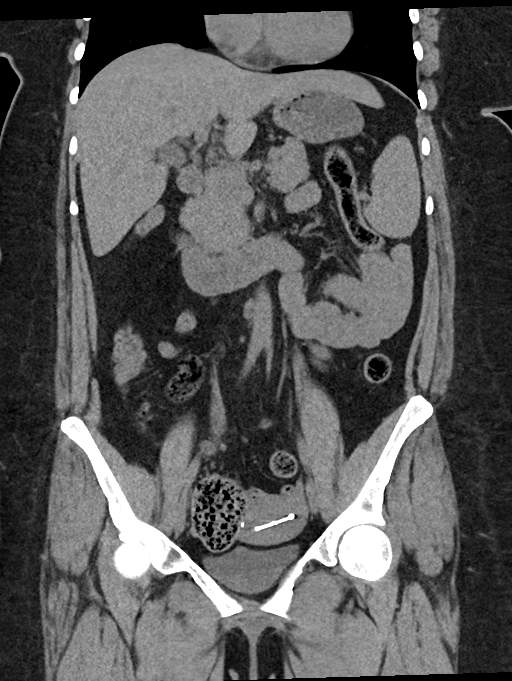
[im 54/98  soft-tissue]
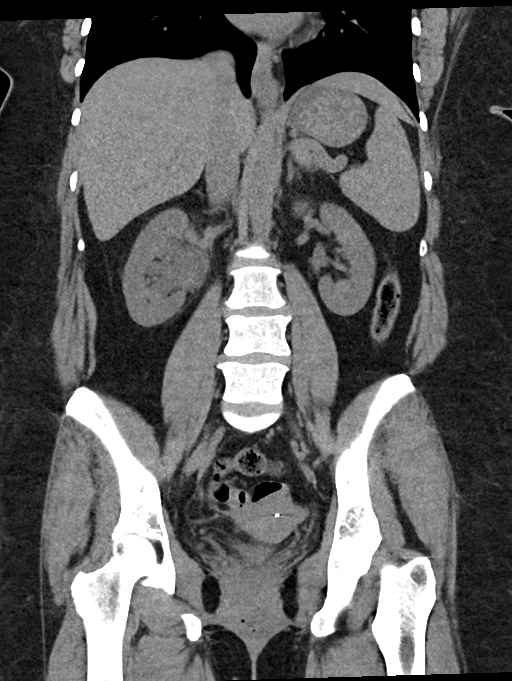

[16 of 46 positions shown; findings below may reference images not displayed]

FINDINGS: Lower chest: Lung bases clear

Hepatobiliary: Gallbladder and liver normal appearance

Pancreas: Normal appearance

Spleen: Normal appearance

Adrenals/Urinary Tract: Adrenal glands and LEFT kidney normal
appearance. RIGHT hydronephrosis secondary to a 10 x 9 x 11 mm RIGHT
UPJ calculus. Ureters and bladder normal appearance

Stomach/Bowel: Prior appendectomy. Low lying cecum in pelvis.
Stomach and bowel loops unremarkable.

Vascular/Lymphatic: Few pelvic phleboliths. Aorta normal caliber. No
adenopathy.

Reproductive: IUD within uterus. Uterus and adnexa otherwise normal
appearance

Other: Small umbilical hernia containing fat. No free air or free
fluid.

Musculoskeletal: Probable bone islands posterior column RIGHT
acetabulum and LEFT femoral head. No acute osseous findings.
IMPRESSION: RIGHT hydronephrosis secondary to a 10 x 9 x 11 mm RIGHT UPJ
calculus.

Small umbilical hernia containing fat.

IUD within uterus.

## 2020-11-16 IMAGING — CR DG ABDOMEN 1V
2 series · 2 of 2 positions shown · non-contrast
Comparison: CT abdomen and pelvis December 20, 2018

CLINICAL DATA: Abdominal pain with double-J stent placed on right

EXAM:
ABDOMEN - 1 VIEW

[t abdomen supine (1 of 2)]
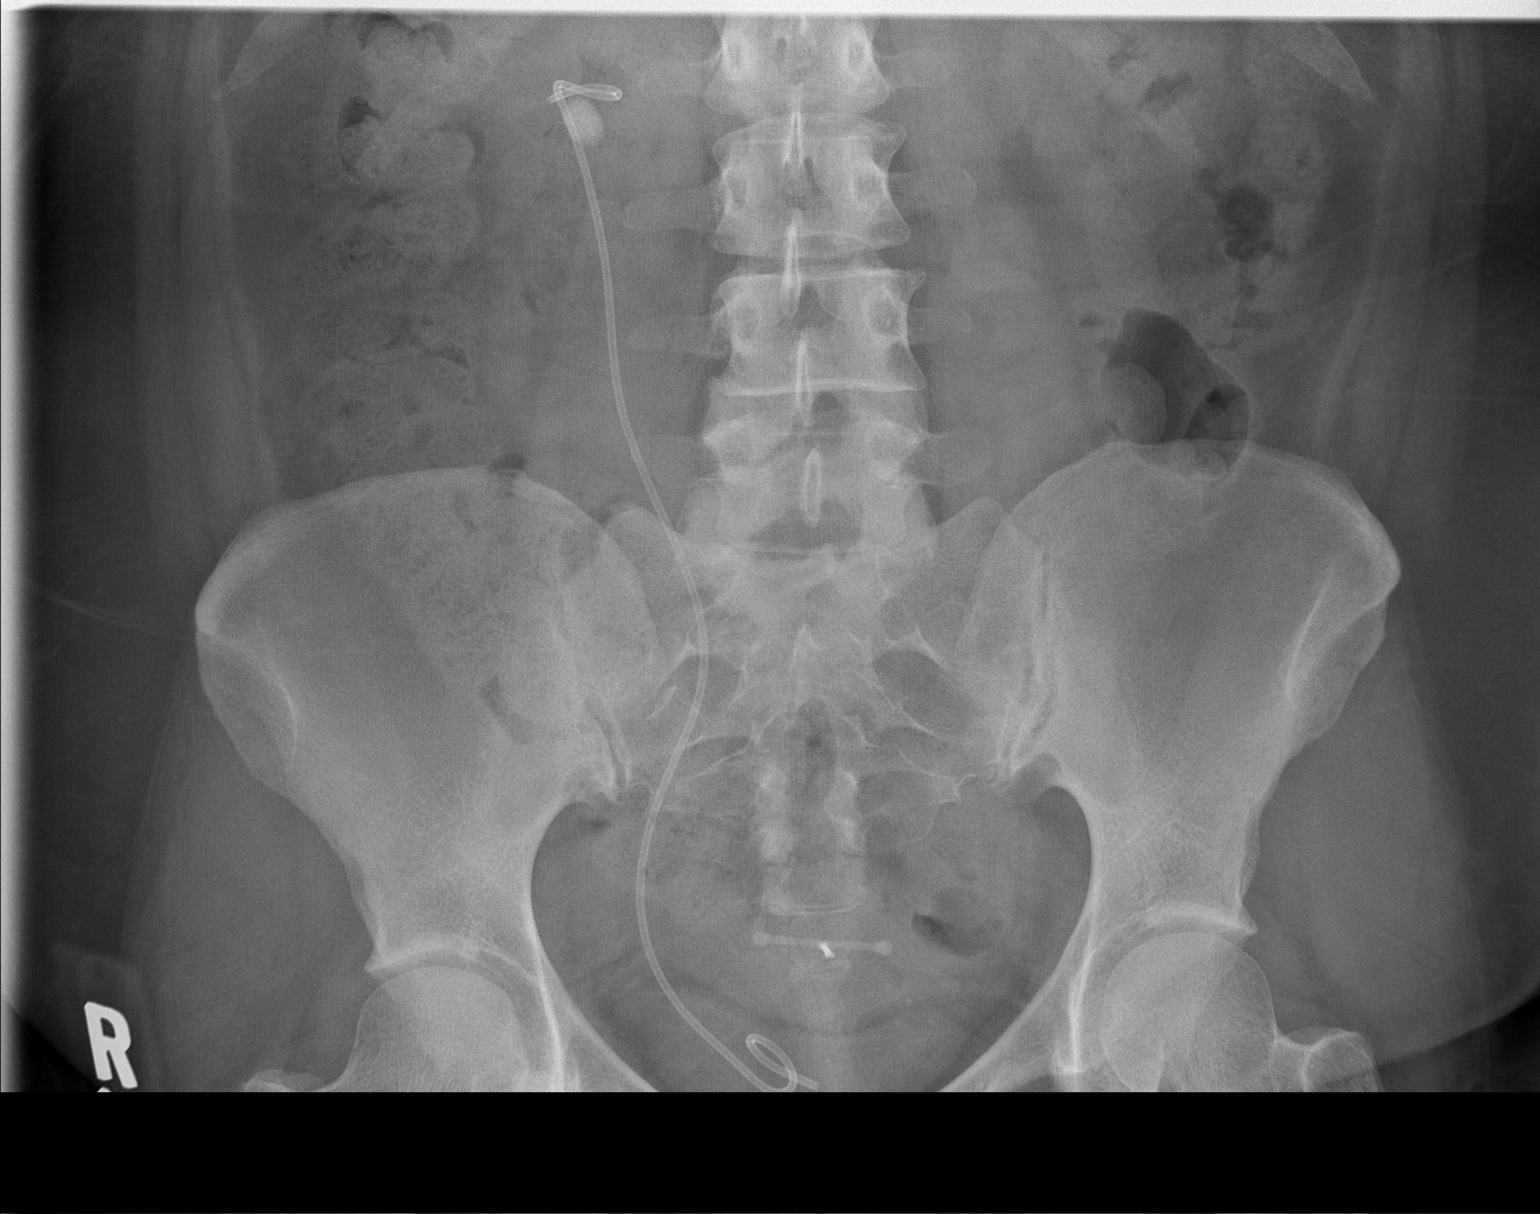

[t abdomen supine (2 of 2)]
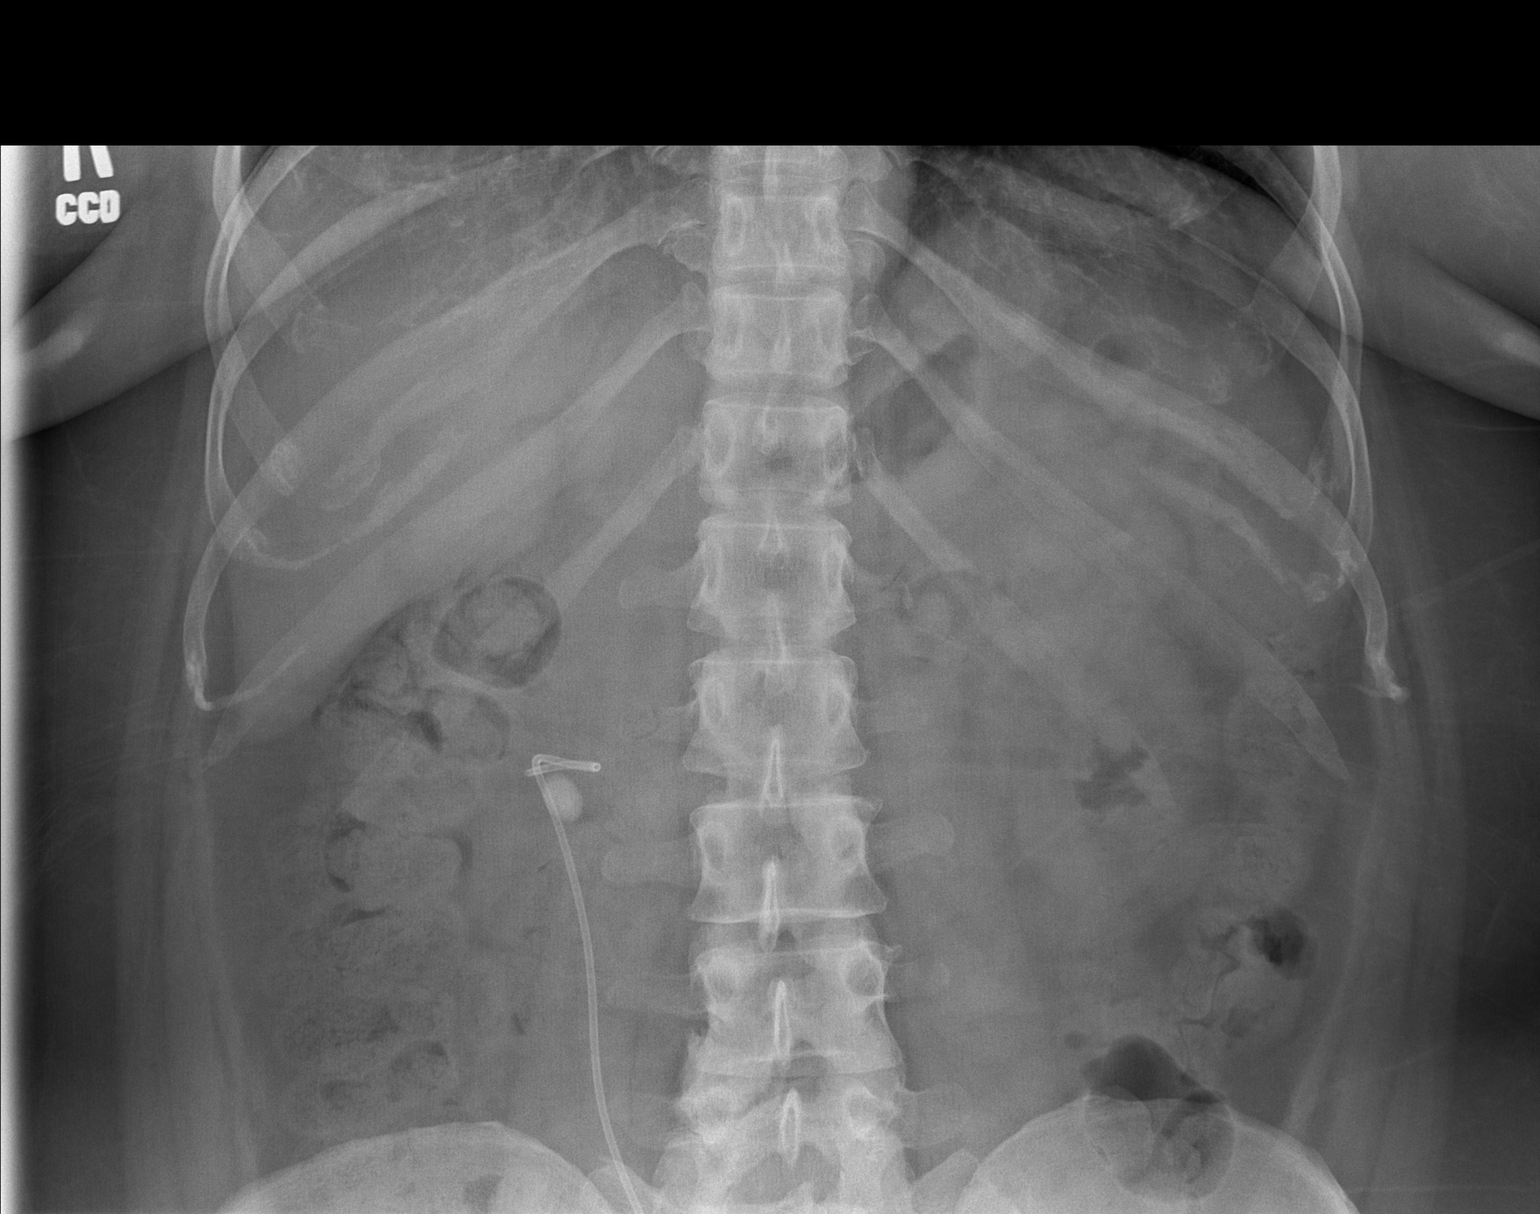

[2 of 2 positions shown; findings below may reference images not displayed]

FINDINGS: Double-J stent extends from the level of L2 on the right to the
bladder. There is a calculus at the L2-3 level on the right
measuring 1.5 x 1.1 cm. No other abnormal calcifications are
evident.

There is an intrauterine device in the mid-pelvis. There is moderate
stool throughout the colon. There is no bowel dilatation or
air-fluid level to suggest bowel obstruction. No free air. Lung
bases clear.
IMPRESSION: Double-J stent on the right with the proximal aspect of the stent
slightly superior to a 1.5 x 1.1 cm calculus which on recent CT was
shown at the right ureteropelvic junction. No new calcifications
evident.

No bowel obstruction or free air.  Lung bases clear.

Intrauterine device in mid pelvis.

## 2020-11-18 IMAGING — DX DG ABDOMEN 1V
1 series · 1 of 1 positions shown · non-contrast
Comparison: 12/25/2018

CLINICAL DATA: Renal stone

EXAM:
ABDOMEN - 1 VIEW

[abdomen kub]
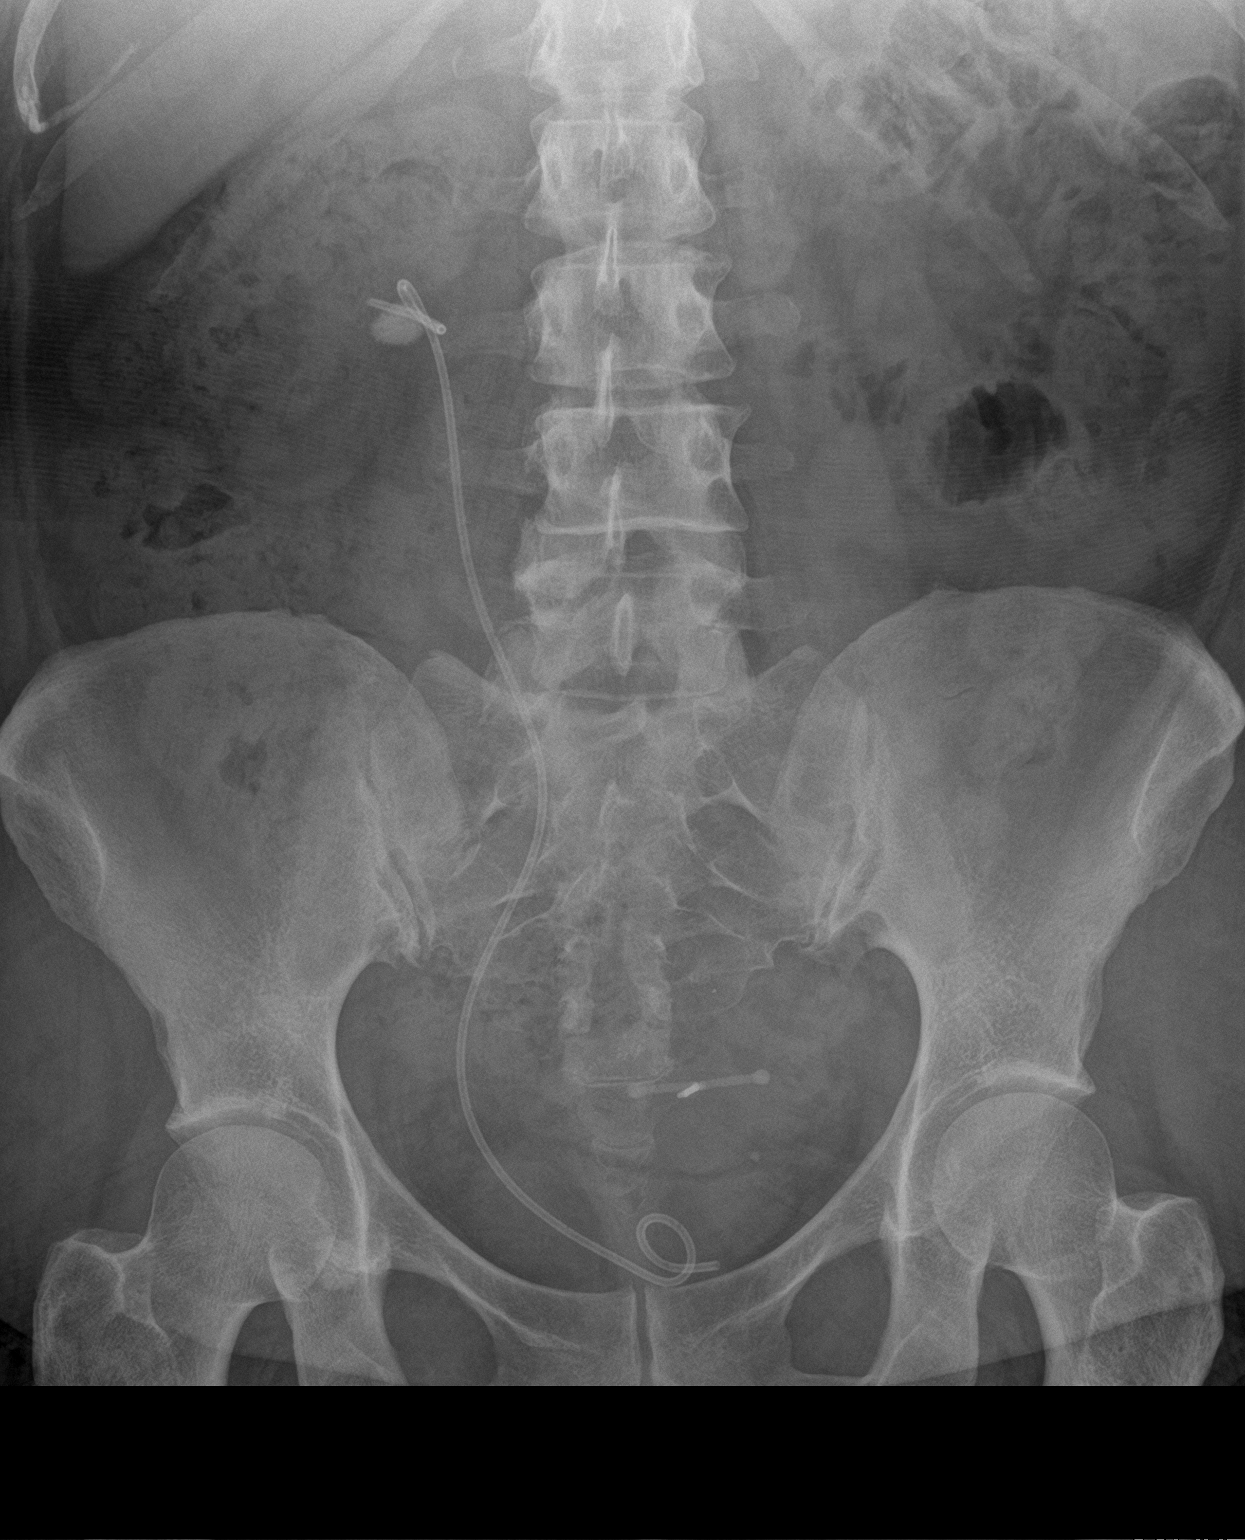

[1 of 1 positions shown; findings below may reference images not displayed]

FINDINGS: Pigtail RIGHT ureteral stent unchanged.

Again identified large calculus projecting over RIGHT renal pelvis
adjacent to stent, 15 x 11 mm.

No additional urinary tract calcifications.

IUD and small phleboliths project over pelvis.

Bowel gas pattern normal.

Osseous structures unremarkable.
IMPRESSION: Stable RIGHT ureteral stent and 15 x 11 mm RIGHT renal pelvic
calculus.

## 2021-02-07 DIAGNOSIS — M719 Bursopathy, unspecified: Secondary | ICD-10-CM

## 2021-02-07 HISTORY — DX: Bursopathy, unspecified: M71.9

## 2022-01-06 NOTE — H&P (Signed)
[name] is a 56 y.o. female, P: 3-0-2-2 presents for hysterectomy with anterior-posterior colporrhaphy because of symptomatic pelvic prolapse.  For years the patient has had increasing problems with lower pelvic pressure (as if her bottom was going to fall out) accompanied by bladder and bowel difficulties.  In the past year however, the patient's urinary frequency, urgency, incomplete bladder emptying and problems with post defecation cleansing and staining. The patient denies any pelvic pain or vaginal bleeding but she admits to some vaginal dryness,  history of renal stones,  loose stools, anal tenderness and occasional itching.  On exam the patient was found to have significant uterine descensus with a large cystocele and rectocele.  She was given a review of both medical and surgical management options, to include observation however, she has decided on definitive therapy in the form of hysterectomy with anterior-posterior colporrhaphy.   Past Medical History  OB History: G: 5;  P: 3-0-2-3;  SVB: 2001, 2003 and 2007 with largest infant > 8 lbs.  GYN History: menarche: 33 YO;  Menopausal: [place] =65.5; Contraception;  Denies history of abnormal PAP smear.   Last PAP smear: 2020 with negative HPV  Medical History: Renal Stones, Interstitial Cystitis, Endometriosis, Migraine, Thyroid Disease, Eczema, Depression and Right Sciatica  Surgical History: 1992: Right Ganglion Cyst Removal; 1997: Laparoscopy (Endometriosis); 2001: Laparoscopy (Endometriosis); 2007: Removal of Bladder Stone; 2021: Lithotripsy; 2023 and  Appendectomy Denies history of blood transfusions but has difficulty awakening from anesthesia.  Family History: Diabetes Mellitus, Thyroid Disease, Asthma, Heart Disease, Lung Fibrosis, Hypertension, Colon Polyps (precancerous), Depression and Polycystic Kidney Disease.  Social History:  Divorced and works with Electrical engineer;  Denies tobacco use and rare consumes  alcohol.  Denies sensitivity to peanuts, shellfish, soy, latex or adhesives (though tape left on skin too long causes a rash).    Medications: Duloxetine 30 mg daily Sumatriptan 50 mg po stat prn (may repeat in 2 hours) Triamcinolone Cream 0.05% apply to affected areas bid as directed  Allergies  Allergen Reactions   Hydrocodone Bit-Homatrop Mbr Itching and Rash   Omnicef [Cefdinir] Other (See Comments)    Gi upset   Sulfa Antibiotics Nausea And Vomiting   Nickel Rash    ROS: Admits to glasses, wears an oral night guard, urinary frequency, urgency, loose stools, incomplete bladder emptying,, pelvic pressure, and headaches with migraine flare,  but denies vision changes, nasal congestion, dysphagia, tinnitus, dizziness, hoarseness, cough,  chest pain, shortness of breath, nausea, vomiting, diarrhea,constipation,  dysuria, hematuria, vaginitis symptoms, pelvic pain, swelling of joints,easy bruising,  myalgias, arthralgias, skin rashes, unexplained weight loss and except as is mentioned in the history of present illness, patient's review of systems is otherwise negative.    Physical Exam  Bp: 120/76;  Weight: 187.6 lbs.  Height: 5\' 3" '  BMI: 33.2  Neck: supple without masses or thyromegaly Lungs: clear to auscultation Heart: regular rate and rhythm Abdomen: soft, non-tender and no organomegaly Pelvic:EGBUS- wnl; vagina-4/4 cystocele and 3/4 rectocele with mild atrophy; uterus-descensus 4/4, normal size, cervix without lesions or motion tenderness; adnexae-no tenderness or masses Extremities:  no clubbing, cyanosis or edema   Assesment: Large Cystocele/Rectocele                      Pelvic Prolapse                      History of Endometriosis   Disposition:  A discussion was held with patient regarding the indication  for her procedure(s) along with the risks, which include but are not limited to: reaction to anesthesia, damage to adjacent organs, infection, excessive bleeding,  possible need for open abdominal incision, and urinary retention.  The patient was given a Miralax Bowel Prep to be completed the day before her surgery.  She has verbalized understanding of these risks and preoperative instructions and has consented to proceed with a Diagnostic Laparoscopy, Total Vaginal Hysterectomy, Bilateral Salpingectomy, Anterior-Posterior Colporrhaphy and Possible Robot Assisted Laparoscopic Hysterectomy at [facility] on January 20, 2022.  CSN# [id number]   Chanon Loney J. Lowell Guitar, PA-C  for Dr. Crist Fat. Rivard

## 2022-01-11 ENCOUNTER — Encounter (HOSPITAL_BASED_OUTPATIENT_CLINIC_OR_DEPARTMENT_OTHER): Payer: Self-pay | Admitting: Obstetrics and Gynecology

## 2022-01-12 ENCOUNTER — Encounter (HOSPITAL_BASED_OUTPATIENT_CLINIC_OR_DEPARTMENT_OTHER): Payer: Self-pay | Admitting: Obstetrics and Gynecology

## 2022-01-12 ENCOUNTER — Other Ambulatory Visit: Payer: Self-pay

## 2022-01-12 DIAGNOSIS — N816 Rectocele: Secondary | ICD-10-CM | POA: Diagnosis not present

## 2022-01-12 DIAGNOSIS — N8110 Cystocele, unspecified: Secondary | ICD-10-CM | POA: Diagnosis not present

## 2022-01-12 DIAGNOSIS — Z01812 Encounter for preprocedural laboratory examination: Secondary | ICD-10-CM | POA: Diagnosis not present

## 2022-01-12 NOTE — Progress Notes (Signed)
Your procedure is scheduled on Thursday, 01/20/22.  Report to [facility] AT  10:00 A. M.   Call this number if you have problems the morning of surgery  :[phone].   OUR ADDRESS IS [address].  [facility] LOCATED IN [facility].  PLEASE BRING YOUR INSURANCE CARD AND PHOTO ID DAY OF SURGERY.  ONLY 2 PEOPLE ARE ALLOWED IN  WAITING  ROOM.                                      REMEMBER:  DO NOT EAT FOOD, CANDY GUM OR MINTS  AFTER MIDNIGHT THE NIGHT BEFORE YOUR SURGERY . YOU MAY HAVE CLEAR LIQUIDS FROM MIDNIGHT THE NIGHT BEFORE YOUR SURGERY UNTIL  9:00 AM. NO CLEAR LIQUIDS AFTER   9:00 AM DAY OF SURGERY.  YOU MAY  BRUSH YOUR TEETH MORNING OF SURGERY AND RINSE YOUR MOUTH OUT, NO CHEWING GUM CANDY OR MINTS.     CLEAR LIQUID DIET   Foods Allowed                                                                     Foods Excluded  Coffee and tea, regular and decaf                             liquids that you cannot  Plain Jell-O                                                                   see through such as: Fruit ices (not with fruit pulp)                                     milk, soups, orange juice  Plain  Popsicles                                    All solid food Carbonated beverages, regular and diet                                    Cranberry, grape and apple juices Sports drinks like Gatorade _____________________________________________________________________     TAKE ONLY THESE MEDICATIONS MORNING OF SURGERY: Imitrex if needed    UP TO 4 VISITORS  MAY VISIT IN THE EXTENDED RECOVERY ROOM UNTIL 800 PM ONLY.  ONE  VISITOR AGE 62 AND OVER MAY SPEND THE NIGHT AND MUST BE IN EXTENDED RECOVERY ROOM NO LATER THAN 800 PM . YOUR DISCHARGE TIME AFTER YOU SPEND THE NIGHT IS 900 AM THE MORNING AFTER YOUR SURGERY.  YOU MAY PACK A SMALL OVERNIGHT BAG WITH TOILETRIES FOR YOUR  OVERNIGHT STAY IF YOU WISH.  YOUR PRESCRIPTION MEDICATIONS  WILL BE PROVIDED DURING [facility].                                      DO NOT WEAR JEWERLY, MAKE UP. DO NOT WEAR LOTIONS, POWDERS, PERFUMES OR NAIL POLISH ON YOUR FINGERNAILS. TOENAIL POLISH IS OK TO WEAR. DO NOT SHAVE FOR 48 HOURS PRIOR TO DAY OF SURGERY. MEN MAY SHAVE FACE AND NECK. CONTACTS, GLASSES, OR DENTURES MAY NOT BE WORN TO SURGERY.  REMEMBER: NO SMOKING, DRUGS OR ALCOHOL FOR 24 HOURS BEFORE YOUR SURGERY.                                    [facility] IS NOT RESPONSIBLE  FOR ANY BELONGINGS.                                                                    [name]           [facility] - Preparing for Surgery Before surgery, you can play an important role.  Because skin is not sterile, your skin needs to be as free of germs as possible.  You can reduce the number of germs on your skin by washing with CHG (chlorahexidine gluconate) soap before surgery.  CHG is an antiseptic cleaner which kills germs and bonds with the skin to continue killing germs even after washing. Please DO NOT use if you have an allergy to CHG or antibacterial soaps.  If your skin becomes reddened/irritated stop using the CHG and inform your nurse when you arrive at Short Stay. Do not shave (including legs and underarms) for at least 48 hours prior to the first CHG shower.  You may shave your face/neck. Please follow these instructions carefully:  1.  Shower with CHG Soap the night before surgery and the  morning of Surgery.  2.  If you choose to wash your hair, wash your hair first as usual with your  normal  shampoo.  3.  After you shampoo, rinse your hair and body thoroughly to remove the  shampoo.                                        4.  Use CHG as you would any other liquid soap.  You can apply chg directly  to the skin and wash , chg soap provided, night before and morning of your surgery.  5.  Apply the CHG Soap to your body ONLY FROM THE NECK DOWN.   Do not use on face/ open                            Wound or open sores. Avoid contact with eyes, ears mouth and genitals (private parts).                       Wash face,  Genitals (private parts) with your normal soap.  6.  Wash thoroughly, paying special attention to the area where your surgery  will be performed.  7.  Thoroughly rinse your body with warm water from the neck down.  8.  DO NOT shower/wash with your normal soap after using and rinsing off  the CHG Soap.             9.  Pat yourself dry with a clean towel.            10.  Wear clean pajamas.            11.  Place clean sheets on your bed the night of your first shower and do not  sleep with pets. Day of Surgery : Do not apply any lotions/deodorants the morning of surgery.  Please wear clean clothes to the hospital/surgery center.  IF YOU HAVE ANY SKIN IRRITATION OR PROBLEMS WITH THE SURGICAL SOAP, PLEASE GET A BAR OF GOLD DIAL SOAP AND SHOWER THE NIGHT BEFORE YOUR SURGERY AND THE MORNING OF YOUR SURGERY. PLEASE LET THE NURSE KNOW MORNING OF YOUR SURGERY IF YOU HAD ANY PROBLEMS WITH THE SURGICAL SOAP.   ________________________________________________________________________                                                        QUESTIONS [facility] PRE OP NURSE PHONE [phone].

## 2022-01-12 NOTE — Progress Notes (Signed)
Spoke w/ via phone for pre-op interview---[facility] Lab needs dos----none               Lab results------01/17/22 lab appt for cbc, type & screen COVID test -----Patient states that she has a runny nose from allergies. Patient instructed to take a covid test and let her surgeon know about her runny nose and any other symptoms that may develop. Arrive at -------1000 on Thursday, 01/20/22 NPO after MN NO Solid Food.  Clear liquids from MN until---0900 Med rec completed Medications to take morning of surgery -----Imitrex prn Diabetic medication -----n/a Patient instructed no nail polish to be worn day of surgery Patient instructed to bring photo id and insurance card day of surgery Patient aware to have Driver (ride ) / caregiver    for 24 hours after surgery - daughter, Kara Mead or son, Apolinar Junes Patient Special Instructions -----Extended / overnight stay instructions given. Ensure drinks given with instructions. Pre-Op special Istructions -----none Patient verbalized understanding of instructions that were given at this phone interview. Patient denies shortness of breath, chest pain, fever, cough at this phone interview.

## 2022-01-17 ENCOUNTER — Encounter (HOSPITAL_COMMUNITY)
Admission: RE | Admit: 2022-01-17 | Discharge: 2022-01-17 | Disposition: A | Discharge status: Home / Self Care | Payer: BC Managed Care – PPO | Source: Ambulatory Visit | Attending: Obstetrics and Gynecology | Admitting: Obstetrics and Gynecology

## 2022-01-17 DIAGNOSIS — N816 Rectocele: Secondary | ICD-10-CM | POA: Insufficient documentation

## 2022-01-17 DIAGNOSIS — Z01812 Encounter for preprocedural laboratory examination: Secondary | ICD-10-CM | POA: Insufficient documentation

## 2022-01-17 DIAGNOSIS — N8110 Cystocele, unspecified: Secondary | ICD-10-CM | POA: Insufficient documentation

## 2022-01-17 DIAGNOSIS — N814 Uterovaginal prolapse, unspecified: Secondary | ICD-10-CM

## 2022-01-17 LAB — CBC
HCT: 43.2 % (ref 36.0–46.0)
Hemoglobin: 14.3 g/dL (ref 12.0–15.0)
MCH: 29.7 pg (ref 26.0–34.0)
MCHC: 33.1 g/dL (ref 30.0–36.0)
MCV: 89.8 fL (ref 80.0–100.0)
Platelets: 238 K/uL (ref 150–400)
RBC: 4.81 MIL/uL (ref 3.87–5.11)
RDW: 12.3 % (ref 11.5–15.5)
WBC: 8.4 K/uL (ref 4.0–10.5)
nRBC: 0 % (ref 0.0–0.2)

## 2022-01-20 ENCOUNTER — Encounter (HOSPITAL_BASED_OUTPATIENT_CLINIC_OR_DEPARTMENT_OTHER): Payer: Self-pay | Admitting: Obstetrics and Gynecology

## 2022-01-20 ENCOUNTER — Ambulatory Visit (HOSPITAL_BASED_OUTPATIENT_CLINIC_OR_DEPARTMENT_OTHER): Payer: BC Managed Care – PPO | Admitting: Certified Registered"

## 2022-01-20 ENCOUNTER — Ambulatory Visit (HOSPITAL_BASED_OUTPATIENT_CLINIC_OR_DEPARTMENT_OTHER)
Admission: RE | Admit: 2022-01-20 | Discharge: 2022-01-21 | Disposition: A | Discharge status: Home / Self Care | Payer: BC Managed Care – PPO | Attending: Obstetrics and Gynecology | Admitting: Obstetrics and Gynecology

## 2022-01-20 ENCOUNTER — Encounter (HOSPITAL_BASED_OUTPATIENT_CLINIC_OR_DEPARTMENT_OTHER)
Admission: RE | Disposition: A | Discharge status: Home / Self Care | Payer: Self-pay | Source: Home / Self Care | Attending: Obstetrics and Gynecology

## 2022-01-20 ENCOUNTER — Other Ambulatory Visit: Payer: Self-pay

## 2022-01-20 DIAGNOSIS — Z6834 Body mass index (BMI) 34.0-34.9, adult: Secondary | ICD-10-CM | POA: Diagnosis not present

## 2022-01-20 DIAGNOSIS — N814 Uterovaginal prolapse, unspecified: Secondary | ICD-10-CM | POA: Insufficient documentation

## 2022-01-20 DIAGNOSIS — E669 Obesity, unspecified: Secondary | ICD-10-CM | POA: Diagnosis not present

## 2022-01-20 DIAGNOSIS — Z01818 Encounter for other preprocedural examination: Secondary | ICD-10-CM

## 2022-01-20 DIAGNOSIS — G4730 Sleep apnea, unspecified: Secondary | ICD-10-CM | POA: Diagnosis not present

## 2022-01-20 DIAGNOSIS — N8110 Cystocele, unspecified: Secondary | ICD-10-CM

## 2022-01-20 DIAGNOSIS — N8111 Cystocele, midline: Secondary | ICD-10-CM

## 2022-01-20 DIAGNOSIS — N816 Rectocele: Secondary | ICD-10-CM

## 2022-01-20 HISTORY — PX: VAGINAL HYSTERECTOMY: SHX2639

## 2022-01-20 HISTORY — DX: Presence of spectacles and contact lenses: Z97.3

## 2022-01-20 HISTORY — DX: Lesion of sciatic nerve, right lower limb: G57.01

## 2022-01-20 HISTORY — DX: Fibromyalgia: M79.7

## 2022-01-20 HISTORY — DX: Gastro-esophageal reflux disease without esophagitis: K21.9

## 2022-01-20 HISTORY — DX: Interstitial cystitis (chronic) without hematuria: N30.10

## 2022-01-20 HISTORY — PX: LAPAROSCOPY: SHX197

## 2022-01-20 HISTORY — PX: ANTERIOR AND POSTERIOR REPAIR: SHX5121

## 2022-01-20 HISTORY — PX: PERINEOPLASTY: SHX2218

## 2022-01-20 LAB — TYPE AND SCREEN
ABO/RH(D): B POS
Antibody Screen: NEGATIVE

## 2022-01-20 LAB — ABO/RH: ABO/RH(D): B POS

## 2022-01-20 LAB — CBC
HCT: 40.5 % (ref 36.0–46.0)
Hemoglobin: 13.4 g/dL (ref 12.0–15.0)
MCH: 30.1 pg (ref 26.0–34.0)
MCHC: 33.1 g/dL (ref 30.0–36.0)
MCV: 91 fL (ref 80.0–100.0)
Platelets: 190 10*3/uL (ref 150–400)
RBC: 4.45 MIL/uL (ref 3.87–5.11)
RDW: 11.9 % (ref 11.5–15.5)
WBC: 12.8 10*3/uL — ABNORMAL HIGH (ref 4.0–10.5)
nRBC: 0 % (ref 0.0–0.2)

## 2022-01-20 SURGERY — LAPAROSCOPY, DIAGNOSTIC
Anesthesia: General | Site: Vagina

## 2022-01-20 MED ORDER — KETOROLAC TROMETHAMINE 30 MG/ML IJ SOLN
INTRAMUSCULAR | Status: DC | PRN
  Administered 2022-01-20: 30 mg via INTRAVENOUS

## 2022-01-20 MED ORDER — MIDAZOLAM HCL 2 MG/2ML IJ SOLN
INTRAMUSCULAR | Status: DC | PRN
  Administered 2022-01-20: 2 mg via INTRAVENOUS

## 2022-01-20 MED ORDER — MIDAZOLAM HCL 2 MG/2ML IJ SOLN
INTRAMUSCULAR | Status: AC
  Filled 2022-01-20: qty 2

## 2022-01-20 MED ORDER — SODIUM CHLORIDE 0.9% FLUSH: 9.0000 mL | INTRAVENOUS | Status: DC | PRN

## 2022-01-20 MED ORDER — DEXAMETHASONE SODIUM PHOSPHATE 10 MG/ML IJ SOLN
INTRAMUSCULAR | Status: DC | PRN
  Administered 2022-01-20: 5 mg via INTRAVENOUS

## 2022-01-20 MED ORDER — FENTANYL 50 MCG/ML IV PCA SOLN: INTRAVENOUS | Status: DC

## 2022-01-20 MED ORDER — ACETAMINOPHEN 500 MG PO TABS
ORAL_TABLET | ORAL | Status: AC
  Filled 2022-01-20: qty 2

## 2022-01-20 MED ORDER — SODIUM CHLORIDE 0.9 % IV SOLN
2.0000 g | INTRAVENOUS | Status: AC
  Administered 2022-01-20: 2 g via INTRAVENOUS

## 2022-01-20 MED ORDER — POVIDONE-IODINE 10 % EX SWAB: 2.0000 | Freq: Once | CUTANEOUS | Status: DC

## 2022-01-20 MED ORDER — ENSURE PRE-SURGERY PO LIQD: 296.0000 mL | Freq: Once | ORAL | Status: DC

## 2022-01-20 MED ORDER — ACETAMINOPHEN 500 MG PO TABS: ORAL_TABLET | ORAL | 1 refills | Status: DC

## 2022-01-20 MED ORDER — OXYCODONE HCL 5 MG/5ML PO SOLN: 5.0000 mg | Freq: Once | ORAL | Status: DC | PRN

## 2022-01-20 MED ORDER — ENSURE PRE-SURGERY PO LIQD: 592.0000 mL | Freq: Once | ORAL | Status: DC

## 2022-01-20 MED ORDER — FENTANYL CITRATE (PF) 100 MCG/2ML IJ SOLN
INTRAMUSCULAR | Status: AC
  Filled 2022-01-20: qty 2

## 2022-01-20 MED ORDER — DEXAMETHASONE SODIUM PHOSPHATE 10 MG/ML IJ SOLN: 4.0000 mg | INTRAMUSCULAR | Status: DC

## 2022-01-20 MED ORDER — GABAPENTIN 300 MG PO CAPS
ORAL_CAPSULE | ORAL | Status: AC
  Filled 2022-01-20: qty 1

## 2022-01-20 MED ORDER — FENTANYL CITRATE (PF) 100 MCG/2ML IJ SOLN
25.0000 ug | INTRAMUSCULAR | Status: DC | PRN
  Administered 2022-01-20 (×4): 25 ug via INTRAVENOUS

## 2022-01-20 MED ORDER — TRAMADOL HCL 50 MG PO TABS
ORAL_TABLET | ORAL | Status: AC
  Filled 2022-01-20: qty 1

## 2022-01-20 MED ORDER — LACTATED RINGERS IV SOLN: INTRAVENOUS | Status: DC

## 2022-01-20 MED ORDER — DEXMEDETOMIDINE HCL IN NACL 80 MCG/20ML IV SOLN
INTRAVENOUS | Status: AC
  Filled 2022-01-20: qty 20

## 2022-01-20 MED ORDER — ACETAMINOPHEN 500 MG PO TABS
1000.0000 mg | ORAL_TABLET | Freq: Four times a day (QID) | ORAL | Status: DC
  Administered 2022-01-20 – 2022-01-21 (×3): 1000 mg via ORAL

## 2022-01-20 MED ORDER — SUGAMMADEX SODIUM 200 MG/2ML IV SOLN
INTRAVENOUS | Status: DC | PRN
  Administered 2022-01-20: 200 mg via INTRAVENOUS

## 2022-01-20 MED ORDER — 0.9 % SODIUM CHLORIDE (POUR BTL) OPTIME
TOPICAL | Status: DC | PRN
  Administered 2022-01-20: 500 mL

## 2022-01-20 MED ORDER — DOCUSATE SODIUM 100 MG PO CAPS
ORAL_CAPSULE | ORAL | Status: AC
  Filled 2022-01-20: qty 1

## 2022-01-20 MED ORDER — DOCUSATE SODIUM 100 MG PO CAPS
100.0000 mg | ORAL_CAPSULE | Freq: Two times a day (BID) | ORAL | Status: DC
  Administered 2022-01-20: 100 mg via ORAL

## 2022-01-20 MED ORDER — HYDROMORPHONE HCL 2 MG PO TABS: ORAL_TABLET | ORAL | 0 refills | Status: DC

## 2022-01-20 MED ORDER — PHENYLEPHRINE HCL-NACL 20-0.9 MG/250ML-% IV SOLN
INTRAVENOUS | Status: DC | PRN
  Administered 2022-01-20: 40 ug/min via INTRAVENOUS

## 2022-01-20 MED ORDER — BUPIVACAINE HCL (PF) 0.25 % IJ SOLN
INTRAMUSCULAR | Status: DC | PRN
  Administered 2022-01-20: 10 mL

## 2022-01-20 MED ORDER — PHENYLEPHRINE HCL (PRESSORS) 10 MG/ML IV SOLN
INTRAVENOUS | Status: AC
  Filled 2022-01-20: qty 1

## 2022-01-20 MED ORDER — PROPOFOL 10 MG/ML IV BOLUS
INTRAVENOUS | Status: AC
  Filled 2022-01-20: qty 20

## 2022-01-20 MED ORDER — PHENYLEPHRINE 80 MCG/ML (10ML) SYRINGE FOR IV PUSH (FOR BLOOD PRESSURE SUPPORT)
PREFILLED_SYRINGE | INTRAVENOUS | Status: DC | PRN
  Administered 2022-01-20 (×3): 160 ug via INTRAVENOUS

## 2022-01-20 MED ORDER — HYDROMORPHONE HCL 2 MG PO TABS
2.0000 mg | ORAL_TABLET | ORAL | Status: DC | PRN
  Administered 2022-01-20: 2 mg via ORAL

## 2022-01-20 MED ORDER — OXYCODONE HCL 5 MG PO TABS: 5.0000 mg | ORAL_TABLET | Freq: Once | ORAL | Status: DC | PRN

## 2022-01-20 MED ORDER — ONDANSETRON HCL 4 MG/2ML IJ SOLN: 4.0000 mg | Freq: Four times a day (QID) | INTRAMUSCULAR | Status: DC | PRN

## 2022-01-20 MED ORDER — SCOPOLAMINE 1 MG/3DAYS TD PT72
1.0000 | MEDICATED_PATCH | TRANSDERMAL | Status: DC
  Administered 2022-01-20: 1.5 mg via TRANSDERMAL

## 2022-01-20 MED ORDER — DIPHENHYDRAMINE HCL 12.5 MG/5ML PO ELIX: 12.5000 mg | ORAL_SOLUTION | Freq: Four times a day (QID) | ORAL | Status: DC | PRN

## 2022-01-20 MED ORDER — ONDANSETRON HCL 4 MG/2ML IJ SOLN
INTRAMUSCULAR | Status: DC | PRN
  Administered 2022-01-20: 4 mg via INTRAVENOUS

## 2022-01-20 MED ORDER — LIDOCAINE HCL (PF) 2 % IJ SOLN
INTRAMUSCULAR | Status: AC
  Filled 2022-01-20: qty 5

## 2022-01-20 MED ORDER — PROPOFOL 10 MG/ML IV BOLUS
INTRAVENOUS | Status: DC | PRN
  Administered 2022-01-20: 180 mg via INTRAVENOUS

## 2022-01-20 MED ORDER — KETOROLAC TROMETHAMINE 30 MG/ML IJ SOLN
30.0000 mg | Freq: Four times a day (QID) | INTRAMUSCULAR | Status: DC
  Administered 2022-01-20 – 2022-01-21 (×2): 30 mg via INTRAVENOUS

## 2022-01-20 MED ORDER — CYCLOBENZAPRINE HCL 5 MG PO TABS
5.0000 mg | ORAL_TABLET | Freq: Three times a day (TID) | ORAL | Status: DC
  Administered 2022-01-20: 5 mg via ORAL
  Filled 2022-01-20: qty 2

## 2022-01-20 MED ORDER — SUCCINYLCHOLINE CHLORIDE 200 MG/10ML IV SOSY
PREFILLED_SYRINGE | INTRAVENOUS | Status: DC | PRN
  Administered 2022-01-20: 140 mg via INTRAVENOUS

## 2022-01-20 MED ORDER — AMISULPRIDE (ANTIEMETIC) 5 MG/2ML IV SOLN: 10.0000 mg | Freq: Once | INTRAVENOUS | Status: DC | PRN

## 2022-01-20 MED ORDER — LIDOCAINE-EPINEPHRINE (PF) 1 %-1:200000 IJ SOLN
INTRAMUSCULAR | Status: DC | PRN
  Administered 2022-01-20: 20 mL

## 2022-01-20 MED ORDER — SCOPOLAMINE 1 MG/3DAYS TD PT72
MEDICATED_PATCH | TRANSDERMAL | Status: AC
  Filled 2022-01-20: qty 1

## 2022-01-20 MED ORDER — KETOROLAC TROMETHAMINE 30 MG/ML IJ SOLN
INTRAMUSCULAR | Status: AC
  Filled 2022-01-20: qty 1

## 2022-01-20 MED ORDER — IBUPROFEN 200 MG PO TABS: 600.0000 mg | ORAL_TABLET | Freq: Four times a day (QID) | ORAL | Status: DC

## 2022-01-20 MED ORDER — ESTRADIOL 0.1 MG/GM VA CREA
TOPICAL_CREAM | VAGINAL | Status: DC | PRN
  Administered 2022-01-20: 1 via VAGINAL

## 2022-01-20 MED ORDER — ACETAMINOPHEN 500 MG PO TABS
1000.0000 mg | ORAL_TABLET | ORAL | Status: AC
  Administered 2022-01-20: 1000 mg via ORAL

## 2022-01-20 MED ORDER — MENTHOL 3 MG MT LOZG: 1.0000 | LOZENGE | OROMUCOSAL | Status: DC | PRN

## 2022-01-20 MED ORDER — FENTANYL CITRATE (PF) 100 MCG/2ML IJ SOLN
50.0000 ug | Freq: Once | INTRAMUSCULAR | Status: AC
  Administered 2022-01-20: 50 ug via INTRAVENOUS

## 2022-01-20 MED ORDER — FENTANYL CITRATE (PF) 100 MCG/2ML IJ SOLN
INTRAMUSCULAR | Status: DC | PRN
  Administered 2022-01-20 (×2): 50 ug via INTRAVENOUS

## 2022-01-20 MED ORDER — SIMETHICONE 80 MG PO CHEW: 80.0000 mg | CHEWABLE_TABLET | Freq: Four times a day (QID) | ORAL | Status: DC | PRN

## 2022-01-20 MED ORDER — LIDOCAINE HCL (PF) 2 % IJ SOLN
INTRAMUSCULAR | Status: AC
  Filled 2022-01-20: qty 10

## 2022-01-20 MED ORDER — ONDANSETRON HCL 4 MG/2ML IJ SOLN: 4.0000 mg | Freq: Once | INTRAMUSCULAR | Status: DC | PRN

## 2022-01-20 MED ORDER — IBUPROFEN 600 MG PO TABS: ORAL_TABLET | ORAL | 1 refills | Status: AC

## 2022-01-20 MED ORDER — SODIUM CHLORIDE 0.9 % IV SOLN
INTRAVENOUS | Status: AC
  Filled 2022-01-20: qty 2

## 2022-01-20 MED ORDER — FENTANYL 50 MCG/ML IV PCA SOLN
INTRAVENOUS | Status: DC
  Administered 2022-01-21: 70 ug via INTRAVENOUS
  Administered 2022-01-21: 150 ug via INTRAVENOUS
  Filled 2022-01-20: qty 25

## 2022-01-20 MED ORDER — GABAPENTIN 300 MG PO CAPS
300.0000 mg | ORAL_CAPSULE | ORAL | Status: AC
  Administered 2022-01-20: 300 mg via ORAL

## 2022-01-20 MED ORDER — HYDROMORPHONE HCL 2 MG PO TABS
ORAL_TABLET | ORAL | Status: AC
  Filled 2022-01-20: qty 1

## 2022-01-20 MED ORDER — DIPHENHYDRAMINE HCL 50 MG/ML IJ SOLN: 12.5000 mg | Freq: Four times a day (QID) | INTRAMUSCULAR | Status: DC | PRN

## 2022-01-20 MED ORDER — MORPHINE SULFATE (PF) 4 MG/ML IV SOLN: 1.0000 mg | INTRAVENOUS | Status: DC | PRN

## 2022-01-20 MED ORDER — LIDOCAINE 2% (20 MG/ML) 5 ML SYRINGE
INTRAMUSCULAR | Status: DC | PRN
  Administered 2022-01-20: 60 mg via INTRAVENOUS
  Administered 2022-01-20: 1.5 mg/kg/h via INTRAVENOUS

## 2022-01-20 MED ORDER — ROCURONIUM BROMIDE 10 MG/ML (PF) SYRINGE
PREFILLED_SYRINGE | INTRAVENOUS | Status: DC | PRN
  Administered 2022-01-20: 10 mg via INTRAVENOUS
  Administered 2022-01-20: 40 mg via INTRAVENOUS
  Administered 2022-01-20 (×2): 10 mg via INTRAVENOUS

## 2022-01-20 MED ORDER — ONDANSETRON HCL 4 MG PO TABS: 4.0000 mg | ORAL_TABLET | Freq: Four times a day (QID) | ORAL | Status: DC | PRN

## 2022-01-20 MED ORDER — ROCURONIUM BROMIDE 10 MG/ML (PF) SYRINGE
PREFILLED_SYRINGE | INTRAVENOUS | Status: AC
  Filled 2022-01-20: qty 10

## 2022-01-20 MED ORDER — LACTATED RINGERS IV BOLUS
500.0000 mL | Freq: Once | INTRAVENOUS | Status: AC
  Administered 2022-01-20: 500 mL via INTRAVENOUS

## 2022-01-20 MED ORDER — KETOROLAC TROMETHAMINE 15 MG/ML IJ SOLN: 15.0000 mg | INTRAMUSCULAR | Status: DC

## 2022-01-20 MED ORDER — TRAMADOL HCL 50 MG PO TABS
50.0000 mg | ORAL_TABLET | Freq: Four times a day (QID) | ORAL | Status: DC | PRN
  Administered 2022-01-20 – 2022-01-21 (×2): 50 mg via ORAL

## 2022-01-20 MED ORDER — SUCCINYLCHOLINE CHLORIDE 200 MG/10ML IV SOSY
PREFILLED_SYRINGE | INTRAVENOUS | Status: AC
  Filled 2022-01-20: qty 10

## 2022-01-20 MED ORDER — NALOXONE HCL 0.4 MG/ML IJ SOLN: 0.4000 mg | INTRAMUSCULAR | Status: DC | PRN

## 2022-01-20 SURGICAL SUPPLY — 58 items
ADH SKN CLS APL DERMABOND .7 (GAUZE/BANDAGES/DRESSINGS) ×4
APL SWBSTK 6 STRL LF DISP (MISCELLANEOUS) ×4
APPLICATOR COTTON TIP 6 STRL (MISCELLANEOUS) ×4 IMPLANT
APPLICATOR COTTON TIP 6IN STRL (MISCELLANEOUS) ×4
CNTNR URN SCR LID CUP LEK RST (MISCELLANEOUS) ×1 IMPLANT
CONT SPEC 4OZ STRL OR WHT (MISCELLANEOUS) ×4
COVER BACK TABLE 60X90IN (DRAPES) ×4 IMPLANT
COVER MAYO STAND STRL (DRAPES) ×4 IMPLANT
DERMABOND ADVANCED .7 DNX12 (GAUZE/BANDAGES/DRESSINGS) ×4 IMPLANT
DRAPE UTILITY XL STRL (DRAPES) ×4 IMPLANT
DURAPREP 26ML APPLICATOR (WOUND CARE) ×4 IMPLANT
ELECT REM PT RETURN 9FT ADLT (ELECTROSURGICAL) ×4
ELECTRODE REM PT RTRN 9FT ADLT (ELECTROSURGICAL) ×4 IMPLANT
GAUZE 4X4 16PLY ~~LOC~~+RFID DBL (SPONGE) ×9 IMPLANT
GAUZE PACKING 1/2X5YD (GAUZE/BANDAGES/DRESSINGS) ×1 IMPLANT
GLOVE BIO SURGEON STRL SZ7 (GLOVE) ×4 IMPLANT
GLOVE BIOGEL PI IND STRL 6.5 (GLOVE) ×2 IMPLANT
GLOVE BIOGEL PI IND STRL 7.0 (GLOVE) ×25 IMPLANT
GLOVE ECLIPSE 6.5 STRL STRAW (GLOVE) ×13 IMPLANT
GLOVE SURG SS PI 7.5 STRL IVOR (GLOVE) ×2 IMPLANT
GOWN STRL REUS W/TWL LRG LVL3 (GOWN DISPOSABLE) ×18 IMPLANT
HIBICLENS CHG 4% 4OZ BTL (MISCELLANEOUS) ×5 IMPLANT
HOLDER FOLEY CATH W/STRAP (MISCELLANEOUS) ×1 IMPLANT
KIT TURNOVER CYSTO (KITS) ×4 IMPLANT
LEGGING LITHOTOMY PAIR STRL (DRAPES) ×4 IMPLANT
MANIFOLD NEPTUNE II (INSTRUMENTS) ×1 IMPLANT
NEEDLE HYPO 22GX1.5 SAFETY (NEEDLE) ×4 IMPLANT
NS IRRIG 500ML POUR BTL (IV SOLUTION) ×1 IMPLANT
PACK LAVH (CUSTOM PROCEDURE TRAY) ×1 IMPLANT
PACK TRENDGUARD 450 HYBRID PRO (MISCELLANEOUS) ×4 IMPLANT
PAD OB MATERNITY 4.3X12.25 (PERSONAL CARE ITEMS) ×4 IMPLANT
PAD PREP 24X48 CUFFED NSTRL (MISCELLANEOUS) ×4 IMPLANT
POUCH LAPAROSCOPIC INSTRUMENT (MISCELLANEOUS) ×4 IMPLANT
PROTECTOR NERVE ULNAR (MISCELLANEOUS) ×11 IMPLANT
SET IRRIG Y TYPE TUR BLADDER L (SET/KITS/TRAYS/PACK) IMPLANT
SET TRI-LUMEN FLTR TB AIRSEAL (TUBING) ×4 IMPLANT
SLEEVE Z-THREAD 5X100MM (TROCAR) ×3 IMPLANT
SPIKE FLUID TRANSFER (MISCELLANEOUS) ×8 IMPLANT
SUT MNCRL AB 3-0 PS2 27 (SUTURE) ×7 IMPLANT
SUT VIC AB 0 CT1 18XCR BRD8 (SUTURE) ×3 IMPLANT
SUT VIC AB 0 CT1 27 (SUTURE) ×16
SUT VIC AB 0 CT1 27XBRD ANBCTR (SUTURE) ×10 IMPLANT
SUT VIC AB 0 CT1 8-18 (SUTURE) ×12
SUT VIC AB 2-0 CT2 27 (SUTURE) ×14 IMPLANT
SUT VIC AB 2-0 SH 27 (SUTURE) ×8
SUT VIC AB 2-0 SH 27XBRD (SUTURE) ×14 IMPLANT
SUT VIC AB 2-0 UR6 27 (SUTURE) ×6 IMPLANT
SUT VIC AB 3-0 CT1 36 (SUTURE) ×2 IMPLANT
SUT VIC AB 3-0 SH 27 (SUTURE) ×12
SUT VIC AB 3-0 SH 27X BRD (SUTURE) ×12 IMPLANT
SUT VICRYL 0 TIES 12 18 (SUTURE) ×1 IMPLANT
SUT VICRYL 0 UR6 27IN ABS (SUTURE) ×8 IMPLANT
TOWEL OR 17X26 10 PK STRL BLUE (TOWEL DISPOSABLE) ×7 IMPLANT
TRAY FOLEY W/BAG SLVR 14FR LF (SET/KITS/TRAYS/PACK) ×4 IMPLANT
TRENDGUARD 450 HYBRID PRO PACK (MISCELLANEOUS) ×4
TROCAR BALLN 12MMX100 BLUNT (TROCAR) ×4 IMPLANT
TROCAR Z-THREAD FIOS 5X100MM (TROCAR) ×3 IMPLANT
WARMER LAPAROSCOPE (MISCELLANEOUS) ×4 IMPLANT

## 2022-01-20 NOTE — Interval H&P Note (Signed)
History and Physical Interval Note:  01/20/2022 11:58 AM  [name]  has presented today for surgery, with the diagnosis of PELVIC PROLAPSE, HISTORY OF STAGE FOUR ENDOMETRIOSIS.  The various methods of treatment have been discussed with the patient and family. After consideration of risks, benefits and other options for treatment, the patient has consented to  Procedure(s) with comments: LAPAROSCOPY DIAGNOSTIC (N/A) ANTERIOR (CYSTOCELE) AND POSTERIOR REPAIR (RECTOCELE) (N/A) possible, XI ROBOTIC ASSISTED TOTAL HYSTERECTOMY (N/A) - PER [name] UNSURE ABOUT TOTAL HYSTERECTOMY BEING ROBOTIC ASSISTED as a surgical intervention.  The patient's history has been reviewed, patient examined, no change in status, stable for surgery.  I have reviewed the patient's chart and labs.  Questions were answered to the patient's satisfaction.     [name]

## 2022-01-20 NOTE — Discharge Instructions (Signed)
Call [facility] at [phone] if:   You have a temperature greater than or equal to 100.4 degrees Farenheit orally You have pain that is not made better by the pain medication given and taken as directed You have excessive bleeding or problems urinating  Take Colace (Docusate Sodium/Stool Softener) 100 mg 2-3 times daily while taking narcotic pain medicine to avoid constipation or until bowel movements are regular.  Take, with food, Ibuprofen 600 mg and  Acetaminophen 500 mg  (#2 tablets) every 6 hours for 5 days then as needed for pain  You may drive after 2 weeks You may walk up steps  You may shower tomorrow You may resume a regular diet  Keep incisions clean and dry Do not lift over 15 pounds for 6 weeks Avoid anything in vagina for 6 weeks

## 2022-01-20 NOTE — Anesthesia Preprocedure Evaluation (Addendum)
Anesthesia Evaluation  Patient identified by MRN, date of birth, ID band Patient awake    Reviewed: Allergy & Precautions, NPO status , Patient's Chart, lab work & pertinent test results, reviewed documented beta blocker date and time   History of Anesthesia Complications (+) PONV and history of anesthetic complications  Airway Mallampati: III  TM Distance: >3 FB Neck ROM: Full    Dental no notable dental hx. (+) Teeth Intact, Dental Advisory Given   Pulmonary sleep apnea    Pulmonary exam normal breath sounds clear to auscultation       Cardiovascular negative cardio ROS Normal cardiovascular exam Rhythm:Regular Rate:Normal     Neuro/Psych  Headaches PSYCHIATRIC DISORDERS  Depression     Neuromuscular disease    GI/Hepatic Neg liver ROS,GERD  Medicated,,  Endo/Other  Hypothyroidism  Obesity GLP-1 agonist therapy  Renal/GU Renal diseaseHx/o renal calculi   Interstitial cystitis    Musculoskeletal  (+) Arthritis , Osteoarthritis,  Fibromyalgia -Sciatica   Abdominal  (+) + obese  Peds  Hematology negative hematology ROS (+)   Anesthesia Other Findings   Reproductive/Obstetrics Pelvic prolapse Endometriosis                              Anesthesia Physical Anesthesia Plan  ASA: 2  Anesthesia Plan: General   Post-op Pain Management: Precedex, Tylenol PO (pre-op)* and Dilaudid IV   Induction: Intravenous, Cricoid pressure planned and Rapid sequence  PONV Risk Score and Plan: 4 or greater and Midazolam, Treatment may vary due to age or medical condition, Dexamethasone and Ondansetron  Airway Management Planned: Oral ETT  Additional Equipment: None  Intra-op Plan:   Post-operative Plan: Extubation in OR  Informed Consent: I have reviewed the patients History and Physical, chart, labs and discussed the procedure including the risks, benefits and alternatives for the proposed  anesthesia with the patient or authorized representative who has indicated his/her understanding and acceptance.     Dental advisory given  Plan Discussed with: Anesthesiologist and CRNA  Anesthesia Plan Comments:         Anesthesia Quick Evaluation

## 2022-01-20 NOTE — Transfer of Care (Signed)
Immediate Anesthesia Transfer of Care Note  Patient: [name]  Procedure(s) Performed: Procedure(s) (LRB): LAPAROSCOPY DIAGNOSTIC (N/A) ANTERIOR (CYSTOCELE) AND POSTERIOR REPAIR (RECTOCELE) (N/A) TOTAL VAGINAL HYSTERECTOMY, BILATERAL SALPINGECTOMY PERINEOPLASTY  Patient Location: PACU  Anesthesia Type: General  Level of Consciousness: awake, oriented, sedated and patient cooperative  Airway & Oxygen Therapy: Patient Spontanous Breathing and Patient connected to face mask oxygen  Post-op Assessment: Report given to PACU RN and Post -op Vital signs reviewed and stable  Post vital signs: Reviewed and stable  Complications: No apparent anesthesia complications Last Vitals:  Vitals Value Taken Time  BP 130/83 01/20/22 1548  Temp 36.4 C 01/20/22 1548  Pulse 84 01/20/22 1556  Resp 20 01/20/22 1556  SpO2 97 % 01/20/22 1556  Vitals shown include unvalidated device data.  Last Pain:  Vitals:   01/20/22 1029  TempSrc: Oral      Patients Stated Pain Goal: 3 (01/20/22 1554)  Complications: No notable events documented.

## 2022-01-20 NOTE — Anesthesia Postprocedure Evaluation (Signed)
Anesthesia Post Note  Patient: [name]  Procedure(s) Performed: LAPAROSCOPY DIAGNOSTIC (Abdomen) ANTERIOR (CYSTOCELE) AND POSTERIOR REPAIR (RECTOCELE) (Vagina ) TOTAL VAGINAL HYSTERECTOMY, BILATERAL SALPINGECTOMY (Vagina ) PERINEOPLASTY (Vagina )     Patient location during evaluation: PACU Anesthesia Type: General Level of consciousness: awake and alert Pain management: pain level controlled Vital Signs Assessment: post-procedure vital signs reviewed and stable Respiratory status: spontaneous breathing, nonlabored ventilation and respiratory function stable Cardiovascular status: blood pressure returned to baseline and stable Postop Assessment: no apparent nausea or vomiting Anesthetic complications: no   No notable events documented.  Last Vitals:  Vitals:   01/20/22 1715 01/20/22 1730  BP: 113/75 116/70  Pulse: 67   Resp: 14 15  Temp: 36.6 C   SpO2: 98% 95%    Last Pain:  Vitals:   01/20/22 1730  TempSrc:   PainSc: 6                  Lucretia Kern

## 2022-01-20 NOTE — Op Note (Signed)
Preoperative diagnosis:pelvic prolapse with history of severe endometriosis  Postoperative diagnosis: Same   Anesthesia: General  Anesthesiologist: Dr Malen Gauze  Procedure: Diagnostic laparoscopy,Total vaginal hysterectomy with anterior and posterior repair and perineoplasty   Surgeon: Dr. Dois Davenport Michele Judy   Assistant: Henreitta Leber P.A.-C.   Estimated blood loss: 200 cc   Procedure:  After being informed of the planned procedure with possible complications including bleeding, infection, injury to other organs,informed consent is obtained and the patient is taken to OR # 5. She is placed in lithotomy position, prepped and draped in a sterile fashion, and her bladder is emptied with a Foley catheter.  Pelvic exam: Normal external genitalia, uterine prolapse 4/4 with normal size uterus, cystocele 2/4, rectocele 2/4, normal adnexa  A speculum is inserted in the vagina and the anterior lip of the cervix was grasped with tenaculum forcep. A Hulka intrauterine manipulator is easily positioned and the speculum is removed.  We infiltrate the umbilical area using 10 cc of Marcaine 0.25 and perform a semi-elliptical incision which is brought down bluntly to the fascia. The fascia is identified and grasped with Coker forceps. The fascia is opened with Mayo scissors. Peritoneum is entered bluntly. A pursestring suture of 0 Vicryl is placed on the fascia. A 10 mm Hassan trocar is easily inserted in the abdominal cavity and is held in place both by the intrauterine balloon and the previously placed pursestring suture. This allows for easy insufflation of the pneumoperitoneum using warm CO2 at a maximum pressure of 15 mmHg.   Observation: Anterior cul-de-sac is normal, posterior cul-de-sac is normal, uterus is normal. Both tubes and ovaries are normal. Liver is visualized and normal. There is no evidence of endometriosis. There are no adhesions  All instruments are then removed after evacuating the  pneumoperitoneum. The fascia of the umbilical incision is closed with the purse string suture of 0 Vicryl. The skin is closed with a subcuticular suture of 3-0 Monocryl and Dermabond.  We proceed with the total vaginal hysterectomy:   A weighted speculum is inserted in the vagina and the uterus was grasped with 1 Christella Hartigan forcep. We infiltrate the mucosa around the cervix using Lidocaine 1 % with epinephrine 1:200,000. We perform a circular incision around the cervix and then proceed with blunt and sharp dissection of the anterior and posterior vaginal mucosa. This identifies the posterior cul-de-sac which was sharply opened. The bladder was retracted upward . Both uterosacral ligaments are isolated on a Rogers forcep, sectioned, sutured with a transfixed suture of 0 Vicryl maintained for future suspension. The anterior cul-de-sac is then dissected up and the peritoneum is opened sharply. This allows for easy isolation of the vascular pedicle on each side using a Rogers forcep. The vascular pedicle is then sectioned and sutured with a transfixed suture of 0 Vicryl. We can then isolate part of the broad ligament on each side using a Rogers forceps. These pedicles are sectioned and sutured with a transfixed suture of 0 Vicryl. We now have easy access to the final pedicle incorporating the round ligament, the utero-ovarian ligament and tube. This last pedicle is then clamped with Rogers forcep, sectioned and doubly sutured with a transfixed suture of 0 Vicryl and a free tie of 0 Vicryl. The uterus is removed.   Both ovaries are visualized and normal. We have access to both tubes so we proceed with bilateral salpingectomy by isolating the mesosalpinx on a Rogers forceps bilaterally. Each tube is then sharply removed and the pedicle is sutured with a  transfix suture of 0 Vicryl and secured with a free tie of 0 Vicryl.   We then systematically verified hemostasis on all pedicles. Hemostasis is completed with  figure-of-eight sutures of 0 Vicryl. Hemostasis is then confirmed adequate. We proceed with closure of the posterior cul-de-sac by placing a suture reuniting the 2 uterosacral ligaments as well as the posterior cul-de-sac with 0 Vicryl. We then placed 2 vaginal suspension sutures using 0 Vicryl which will you reunite the posterior vaginal mucosa, the posterior peritoneum, the corresponding uterosacral ligaments and the anterior peritoneum. The sutures are then tied which closes the peritoneum at the same time.   Proceeding with anterior repair: We isolate the anterior vaginal mucosa using Allis forceps and infiltrated using  Lidocaine 1 % with epinephrine 1:200,000 The vaginal mucosa is then undermined medially and sharply dissected all the way to 1 cm below the urethrovesical junction. We proceed with sharp and blunt dissection of the prevesical fascia until the cystocele was completely freed. We then correct the cystocele with multilayered U- stitches of 2-0 Vicryl until complete resolution. Excess of vaginal mucosa is excised. The anterior vaginal mucosa is then closed using figure-of-eight stitches of 3-0 Vicryl.   The vaginal cuff is then closed transversely using figure-of-eight sutures of 3-0 Vicryl.   Proceeding with posterior repair: We grasped the posterior fourchette on a distance of 2.5 cm infiltrate the posterior vaginal mucosa using Lidocaine 1 % with epinephrine 1:200,000. We sharply dissect the posterior vaginal mucosa midline until 2 cm from the vaginal cuff. We then sharply and bluntly dissect this mucosa away from the perirectal fascia until the rectocele was completely mobilized. We then correct the rectocele will multilayered U-stitches of 2-0 Vicryl until complete resolution. The excess vaginal mucosa is excised and the posterior vaginal mucosa is closed with a running lock suture of 3-0 Vicryl. The perineal muscles are then reapproximated with simple sutures of 3-0 Vicryl. The perineal  skin is then closed with subcuticular suture of 3-0 Vicryl.   The vagina is packed with a 1.2 inch pack saturated with estradiol cream.  Instruments and sponge count is complete x2.   The procedure is well tolerated by the patient who is taken to recovery room in a well and stable condition. Dr Estanislado Pandy was present and scrubbed at all times. Surgical assistance was required due to the complexity of the anatomy and the vaginal approach of the procedure.    Estimated blood loss is 200 cc   Specimen: Uterus with 2 tubes sent to pathology

## 2022-01-20 NOTE — Progress Notes (Signed)
Day of Surgery Procedure(s) (LRB): LAPAROSCOPY DIAGNOSTIC (N/A) ANTERIOR (CYSTOCELE) AND POSTERIOR REPAIR (RECTOCELE) (N/A) TOTAL VAGINAL HYSTERECTOMY, BILATERAL SALPINGECTOMY PERINEOPLASTY  Subjective: Patient reports new onset of low pelvic pain "like contractions"  L>R intensity 5/10 Feels very thirsty Some low back pain Reports vaginal fullness Tolerating normal diet without difficulty. No nausea / vomiting.   Objective: BP (!) 113/58 (BP Location: Left Arm)   Pulse 89   Temp 97.8 F (36.6 C)   Resp 16   Ht 5\' 3"  (1.6 m)   Wt 87.1 kg   LMP 01/14/2018   SpO2 97%   BMI 34.03 kg/m  Lungs: clear Heart: normal rate and rhythm Abdomen:soft no rebound mild guarding LLQ No CVAT Extremities: Homans sign is negative, no sign of DVT  CBC: Hgb 13.4 WBC 12.8 Urine output 50 cc in 1.5 hours, urine is clear Pad: 1/4 saturated no active bleeding noted vaginal packing appears saturated   Assessment: Post-op pain not well managed No evidence of active bleeding Patient was repositioned to left side with pillow under left knee: improved. Suspect MSK component with surgical hyperflexion of hips Findings reviewed with patient. Questions answered  Plan: Optimize pain management with fentanyl PCA Delay packing removal to morning Optimize hydration Add muscle relaxant  LOS: 0 days    14/09/2017 Leelynn Whetsel 01/20/2022, 9:37 PM

## 2022-01-20 NOTE — Progress Notes (Signed)
1715- pt arrived from PACU, pallor present, upon assessment of peripad, moderate amount of sanguinous drainage noted to peripad, drainage noted at vaginal area, medicated per order for abdominal pain, VSS  1800- peripad C/D/I 1900- pt c/o persistent abdominal pain despite analgesics, VSS, peripad with moderate sanguinous drainage noted, MD notified, orders obtained for analgesic, discussed case status  2000- Upon reassessment, pt still has persistent 8/10 sharp, "like contraction" abdominal pain to lower left abdominal area. Abdomen soft, extremely tender and painful to light palpation to lower abdomen and lower left abdomen. Pt VSS, pale, bolus infusing per MD order. Vaginal packing able to be seen upon visual assessment, moderate amount active sanguinous drainage noted from vaginal area at foley site. CBC in process, MD notified, orders obtained for IV pain medication. Orders enacted.

## 2022-01-20 NOTE — Anesthesia Procedure Notes (Signed)
Procedure Name: Intubation Date/Time: 01/20/2022 12:21 PM  Performed by: Suan Halter, CRNAPre-anesthesia Checklist: Patient identified, Emergency Drugs available, Suction available and Patient being monitored Patient Re-evaluated:Patient Re-evaluated prior to induction Oxygen Delivery Method: Circle system utilized Preoxygenation: Pre-oxygenation with 100% oxygen Induction Type: IV induction and Rapid sequence Laryngoscope Size: Mac and 3 Grade View: Grade I Tube type: Oral Tube size: 7.0 mm Number of attempts: 1 Airway Equipment and Method: Stylet and Oral airway Placement Confirmation: ETT inserted through vocal cords under direct vision, positive ETCO2 and breath sounds checked- equal and bilateral Secured at: 22 cm Tube secured with: Tape Dental Injury: Teeth and Oropharynx as per pre-operative assessment

## 2022-01-21 DIAGNOSIS — N814 Uterovaginal prolapse, unspecified: Secondary | ICD-10-CM | POA: Diagnosis not present

## 2022-01-21 MED ORDER — KETOROLAC TROMETHAMINE 30 MG/ML IJ SOLN
INTRAMUSCULAR | Status: AC
  Filled 2022-01-21: qty 1

## 2022-01-21 MED ORDER — CYCLOBENZAPRINE HCL 5 MG PO TABS: ORAL_TABLET | ORAL | 0 refills | Status: AC

## 2022-01-21 MED ORDER — ACETAMINOPHEN 500 MG PO TABS
ORAL_TABLET | ORAL | Status: AC
  Filled 2022-01-21: qty 2

## 2022-01-21 MED ORDER — TRAMADOL HCL 50 MG PO TABS
ORAL_TABLET | ORAL | Status: AC
  Filled 2022-01-21: qty 1

## 2022-01-21 NOTE — Progress Notes (Signed)
This RN wasted 16ml of PCA Fentanyl with Dennard Schaumann, RN. Wasted in Immunologist

## 2022-01-21 NOTE — Final Progress Note (Cosign Needed)
[name] is a39 y.o.  [id number]  Post Op Date # 1: Laparoscopy/TVH/BS/Anterior-Posterior Colporrhaphy  Subjective: Patient is Doing well postoperatively. Patient has Pain is controlled with current analgesics. Medications being used: acetaminophen, prescription NSAID's including ketorolac (Toradol), and narcotic analgesics including Fentanyl PCA., Patient has been ambulating in the halls without dizziness, chest pain or shortness of breath, is tolerating a regular diet,  and has voided twice with  no complaints.   Objective: Vital signs in last 24 hours: Temp:  [97.5 F (36.4 C)-98.4 F (36.9 C)] 97.7 F (36.5 C) (12/15 0815) Pulse Rate:  [58-89] 59 (12/15 0815) Resp:  [12-23] 20 (12/15 0815) BP: (89-130)/(46-83) 93/63 (12/15 0430) SpO2:  [90 %-98 %] 96 % (12/15 0815) Weight:  [87.1 kg] 87.1 kg (12/14 1029)  Intake/Output from previous day: 12/14 0701 - 12/15 0700 In: 3380 [P.O.:1380; I.V.:1900] Out: 2145 [Urine:1945] Intake/Output this shift: No intake/output data recorded. Recent Labs  Lab 01/17/22 1335 01/20/22 2023  WBC 8.4 12.8*  HGB 14.3 13.4  HCT 43.2 40.5  PLT 238 190    No results for input(s): "NA", "K", "CL", "CO2", "BUN", "CREATININE", "CALCIUM", "PROT", "BILITOT", "ALKPHOS", "ALT", "AST", "GLUCOSE" in the last 168 hours.  Invalid input(s): "LABALBU"  EXAM: General: alert, cooperative, and no distress Resp: clear to auscultation bilaterally Cardio: regular rate and rhythm, S1, S2 normal, no murmur, click, rub or gallop GI: bowel sounds present in all 4 quadrants, soft and no guarding Extremities: Homans sign is negative, no sign of DVT and no calf tenderness Vaginal Bleeding: none currently on pad   Assessment: s/p Procedure(s): LAPAROSCOPY DIAGNOSTIC ANTERIOR (CYSTOCELE) AND POSTERIOR REPAIR (RECTOCELE) TOTAL VAGINAL HYSTERECTOMY, BILATERAL SALPINGECTOMY PERINEOPLASTY: stable, progressing well, and tolerating diet  Plan: Discharge home   LOS: 0 days    Henreitta Leber, PA-C 01/21/2022 8:26 AM

## 2022-01-24 ENCOUNTER — Encounter (HOSPITAL_BASED_OUTPATIENT_CLINIC_OR_DEPARTMENT_OTHER): Payer: Self-pay | Admitting: Obstetrics and Gynecology

## 2022-01-24 LAB — SURGICAL PATHOLOGY

## 2022-01-26 NOTE — Discharge Summary (Signed)
Physician Discharge Summary  Patient ID: [name] MRN: [id number] DOB/AGE: May 01, 1965 56 y.o.  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 Post operative course was marked by musculoskeletal discomfort in left hip area that was relieved with a muscle relaxer. The patient resumed bowel and bladder function on the evening of her surgery and was discharged home on the following morning.  Discharge hemoglobin was  13.4.  Disposition: Discharge disposition: 01-Home or Self Care       Discharge Medications:  Allergies as of 01/21/2022       Reactions   Hydrocodone Bit-homatrop Mbr Itching, Rash   Omnicef [cefdinir] Other (See Comments)   Gi upset   Sulfa Antibiotics Nausea And Vomiting   Nickel Rash        Medication List     TAKE these medications    acetaminophen 500 MG tablet Commonly known as: TYLENOL take 2 tablets po every 6 hours for 5 days then prn for post operative pain Notes to patient: Can take next dose after 12pm   albuterol 108 (90 Base) MCG/ACT inhaler Commonly known as: ProAir HFA Inhale 2 puffs into the lungs every 6 (six) hours as needed for wheezing or shortness of breath.   calcium carbonate 500 MG chewable tablet Commonly known as: TUMS - dosed in mg elemental calcium Chew 1 tablet by mouth as needed for indigestion or heartburn.   cyclobenzaprine 5 MG tablet Commonly known as: FLEXERIL take 1 tablet po twice a day as needed for pain Notes to patient: Can take a dose around 10am this morning   DULoxetine 60 MG capsule Commonly known as:  Cymbalta Take 1 capsule (60 mg total) by mouth daily.   HYDROmorphone 2 MG tablet Commonly known as: Dilaudid take 1 tablet po every 6 hours as needed for post operative breakthrough pain Notes to patient: Can take when you get home this morning   ibuprofen 600 MG tablet Commonly known as: ADVIL take 1 tablet po pc every 6 hours for 5 days then prn-post operative pain What changed:  medication strength how much to take how to take this when to take this reasons to take this additional instructions Notes to patient: Can take next dose after 12pm   Insulin Pen Needle 32G X 6 MM Misc 1 Act by Does not apply route daily.   Saxenda 18 MG/3ML Sopn Generic drug: Liraglutide -Weight Management Inject 3 mg into the skin daily.   SUMAtriptan 50 MG tablet Commonly known as: IMITREX Take 1 tablet (50 mg total) by mouth every 2 (two) hours as needed for migraine. May repeat in 2 hours if headache persists or recurs.          Follow-up: Dr. Estanislado Pandy on February 03, 2022 @ 9 a.m. and March 02, 2022 @ 9:30 a.m.     SignedPatrick Jupiter 01/26/2022, 5:19 PM

## 2023-04-11 ENCOUNTER — Ambulatory Visit: Admitting: Internal Medicine

## 2023-04-11 ENCOUNTER — Encounter: Payer: Self-pay | Admitting: Internal Medicine

## 2023-04-11 ENCOUNTER — Ambulatory Visit (INDEPENDENT_AMBULATORY_CARE_PROVIDER_SITE_OTHER)

## 2023-04-11 VITALS — BP 114/76 | HR 70 | Temp 97.6°F | Ht 63.0 in | Wt 242.0 lb

## 2023-04-11 DIAGNOSIS — J069 Acute upper respiratory infection, unspecified: Secondary | ICD-10-CM

## 2023-04-11 MED ORDER — PREDNISONE 10 MG PO TABS: ORAL_TABLET | ORAL | 0 refills | Status: DC

## 2023-04-11 MED ORDER — AZITHROMYCIN 250 MG PO TABS: ORAL_TABLET | ORAL | 0 refills | Status: DC

## 2023-04-11 NOTE — Progress Notes (Signed)
 Subjective:    Patient ID: [name], female    DOB: 04/27/1965, 58 y.o.   MRN: [id number]      HPI [name] is here for  Chief Complaint  Patient presents with   Cough    Going on since the 6th, voice is getting worse    Nasal Congestion    Her cold symptoms started 1 month ago.  She has had a cough and congestion since the very beginning.  The past week or so she has become more hoarse.  Her symptoms are not improving which is why she is here today.  She states fatigue, nasal congestion, cough is primarily dry, shortness of breath, headaches and lightheadedness.  The more she talks the more she coughs.  She has been hoarse.  She has had some diarrhea and a little nausea this morning.   Has tried - dayquil, nyquil, cough drops, advil  Medications and allergies reviewed with patient and updated if appropriate.  Current Outpatient Medications on File Prior to Visit  Medication Sig Dispense Refill   acetaminophen (TYLENOL) 500 MG tablet take 2 tablets po every 6 hours for 5 days then prn for post operative pain 30 tablet 1   albuterol (PROAIR HFA) 108 (90 Base) MCG/ACT inhaler Inhale 2 puffs into the lungs every 6 (six) hours as needed for wheezing or shortness of breath. 1 each 0   calcium carbonate (TUMS - DOSED IN MG ELEMENTAL CALCIUM) 500 MG chewable tablet Chew 1 tablet by mouth as needed for indigestion or heartburn.     cyclobenzaprine (FLEXERIL) 5 MG tablet take 1 tablet po twice a day as needed for pain 20 tablet 0   ibuprofen (ADVIL) 600 MG tablet take 1 tablet po pc every 6 hours for 5 days then prn-post operative pain 30 tablet 1   Insulin Pen Needle 32G X 6 MM MISC 1 Act by Does not apply route daily. 100 each 1   HYDROmorphone (DILAUDID) 2 MG tablet take 1 tablet po every 6 hours as needed for post operative breakthrough pain (Patient not taking: Reported on 04/11/2023) 16 tablet 0   SUMAtriptan (IMITREX) 50 MG tablet Take 1 tablet (50 mg total) by mouth every 2  (two) hours as needed for migraine. May repeat in 2 hours if headache persists or recurs. (Patient not taking: Reported on 04/11/2023) 10 tablet 3   No current facility-administered medications on file prior to visit.    Review of Systems  Constitutional:  Positive for fatigue. Negative for chills and fever.  HENT:  Positive for congestion and voice change. Negative for ear pain, sinus pressure, sinus pain and sore throat.   Respiratory:  Positive for cough (dry) and shortness of breath. Negative for chest tightness and wheezing (? not sure).   Gastrointestinal:  Positive for diarrhea and nausea (a little this morning).  Musculoskeletal:  Negative for myalgias.  Neurological:  Positive for light-headedness and headaches (occ).       Objective:   Vitals:   04/11/23 1452  BP: 114/76  Pulse: 70  Temp: 97.6 F (36.4 C)  SpO2: 98%   BP Readings from Last 3 Encounters:  04/11/23 114/76  01/21/22 110/64  01/17/22 123/68   Wt Readings from Last 3 Encounters:  04/11/23 242 lb (109.8 kg)  01/20/22 192 lb 1.6 oz (87.1 kg)  01/17/22 194 lb (88 kg)   Body mass index is 42.87 kg/m.    Physical Exam Constitutional:      General: She  is not in acute distress.    Appearance: Normal appearance. She is not ill-appearing.  HENT:     Head: Normocephalic and atraumatic.     Right Ear: Tympanic membrane, ear canal and external ear normal.     Left Ear: Tympanic membrane, ear canal and external ear normal.     Mouth/Throat:     Mouth: Mucous membranes are moist.     Pharynx: No oropharyngeal exudate or posterior oropharyngeal erythema.     Comments: Hoarseness Eyes:     Conjunctiva/sclera: Conjunctivae normal.  Cardiovascular:     Rate and Rhythm: Normal rate and regular rhythm.  Pulmonary:     Effort: Pulmonary effort is normal. No respiratory distress.     Breath sounds: Normal breath sounds. No wheezing or rales.     Comments: Frequent dry cough throughout visit Musculoskeletal:      Cervical back: Neck supple. No tenderness.  Lymphadenopathy:     Cervical: No cervical adenopathy.  Skin:    General: Skin is warm and dry.  Neurological:     Mental Status: She is alert.            Assessment & Plan:    URI: Symptoms sound viral in nature, but the duration is concerning Chest x-ray today to rule out pneumonia Given duration will start a Z-Pak Her cough may be an element of reactive airway disease and we will go ahead and start prednisone taper Continue cough drops, over-the-counter cough syrup and over-the-counter cold medications for symptom relief Rest, fluids

## 2023-04-11 NOTE — Patient Instructions (Addendum)
     Have a chest xray downstairs.      Medications changes include :   zpak, prednisone taper     Return if symptoms worsen or fail to improve.

## 2023-05-15 ENCOUNTER — Other Ambulatory Visit: Payer: Self-pay | Admitting: Obstetrics and Gynecology

## 2023-05-15 DIAGNOSIS — Z1231 Encounter for screening mammogram for malignant neoplasm of breast: Secondary | ICD-10-CM

## 2023-05-19 ENCOUNTER — Ambulatory Visit: Payer: BC Managed Care – PPO | Admitting: Internal Medicine

## 2023-05-19 ENCOUNTER — Encounter: Payer: Self-pay | Admitting: Internal Medicine

## 2023-05-19 VITALS — BP 122/84 | HR 75 | Temp 97.9°F | Ht 63.0 in | Wt 242.6 lb

## 2023-05-19 DIAGNOSIS — E034 Atrophy of thyroid (acquired): Secondary | ICD-10-CM | POA: Diagnosis not present

## 2023-05-19 DIAGNOSIS — K589 Irritable bowel syndrome without diarrhea: Secondary | ICD-10-CM

## 2023-05-19 DIAGNOSIS — E781 Pure hyperglyceridemia: Secondary | ICD-10-CM

## 2023-05-19 DIAGNOSIS — M5441 Lumbago with sciatica, right side: Secondary | ICD-10-CM | POA: Diagnosis not present

## 2023-05-19 DIAGNOSIS — F32A Depression, unspecified: Secondary | ICD-10-CM

## 2023-05-19 DIAGNOSIS — M5442 Lumbago with sciatica, left side: Secondary | ICD-10-CM

## 2023-05-19 DIAGNOSIS — G8929 Other chronic pain: Secondary | ICD-10-CM

## 2023-05-19 DIAGNOSIS — Z6841 Body Mass Index (BMI) 40.0 and over, adult: Secondary | ICD-10-CM | POA: Diagnosis not present

## 2023-05-19 LAB — COMPREHENSIVE METABOLIC PANEL WITH GFR
ALT: 17 U/L (ref 0–35)
AST: 17 U/L (ref 0–37)
Albumin: 4.3 g/dL (ref 3.5–5.2)
Alkaline Phosphatase: 77 U/L (ref 39–117)
BUN: 19 mg/dL (ref 6–23)
CO2: 28 meq/L (ref 19–32)
Calcium: 8.9 mg/dL (ref 8.4–10.5)
Chloride: 108 meq/L (ref 96–112)
Creatinine, Ser: 0.81 mg/dL (ref 0.40–1.20)
GFR: 81.52 mL/min (ref >60.00)
Glucose, Bld: 104 mg/dL — ABNORMAL HIGH (ref 70–99)
Potassium: 4.3 meq/L (ref 3.5–5.1)
Sodium: 143 meq/L (ref 135–145)
Total Bilirubin: 0.6 mg/dL (ref 0.2–1.2)
Total Protein: 6.5 g/dL (ref 6.0–8.3)

## 2023-05-19 LAB — CBC
HCT: 41.7 % (ref 36.0–46.0)
Hemoglobin: 14 g/dL (ref 12.0–15.0)
MCHC: 33.7 g/dL (ref 30.0–36.0)
MCV: 86.5 fl (ref 78.0–100.0)
Platelets: 258 10*3/uL (ref 150.0–400.0)
RBC: 4.81 Mil/uL (ref 3.87–5.11)
RDW: 14.6 % (ref 11.5–15.5)
WBC: 7.3 10*3/uL (ref 4.0–10.5)

## 2023-05-19 LAB — LIPID PANEL
Cholesterol: 161 mg/dL (ref 0–200)
HDL: 52 mg/dL (ref >39.00)
LDL Cholesterol: 92 mg/dL (ref 0–99)
NonHDL: 108.55
Total CHOL/HDL Ratio: 3
Triglycerides: 82 mg/dL (ref 0.0–149.0)
VLDL: 16.4 mg/dL (ref 0.0–40.0)

## 2023-05-19 LAB — TSH: TSH: 3.88 u[IU]/mL (ref 0.35–5.50)

## 2023-05-19 LAB — HEMOGLOBIN A1C: Hgb A1c MFr Bld: 5.6 % (ref 4.6–6.5)

## 2023-05-19 NOTE — Assessment & Plan Note (Signed)
 Would not like to pursue imaging or PT today. She has had worsening for >1 month at this time. She will let us know if this persists or changes. Using iburpofen otc for pain. No red flag symptoms.

## 2023-05-19 NOTE — Assessment & Plan Note (Signed)
 Checking lipid panel and adjust as needed.

## 2023-05-19 NOTE — Progress Notes (Signed)
   Subjective:   Patient ID: [name], female    DOB: November 13, 1965, 58 y.o.   MRN: [id number]  HPI The patient is a 58 YO female coming in for establish care previously established with another provider at our office and seen within 3 years. Having a lot of sciatic issues.  PMH, [facility], social history reviewed and updated  Review of Systems  Constitutional:  Positive for activity change.  HENT: Negative.    Eyes: Negative.   Respiratory:  Positive for shortness of breath. Negative for cough and chest tightness.   Cardiovascular:  Negative for chest pain, palpitations and leg swelling.  Gastrointestinal:  Negative for abdominal distention, abdominal pain, constipation, diarrhea, nausea and vomiting.  Musculoskeletal:  Positive for back pain.  Skin: Negative.   Neurological: Negative.   Psychiatric/Behavioral: Negative.      Objective:  Physical Exam Constitutional:      Appearance: She is well-developed. She is obese.  HENT:     Head: Normocephalic and atraumatic.  Cardiovascular:     Rate and Rhythm: Normal rate and regular rhythm.  Pulmonary:     Effort: Pulmonary effort is normal. No respiratory distress.     Breath sounds: Normal breath sounds. No wheezing or rales.  Abdominal:     General: Bowel sounds are normal. There is no distension.     Palpations: Abdomen is soft.     Tenderness: There is no abdominal tenderness. There is no rebound.  Musculoskeletal:        General: Tenderness present.     Cervical back: Normal range of motion.  Skin:    General: Skin is warm and dry.  Neurological:     Mental Status: She is alert and oriented to person, place, and time.     Coordination: Coordination normal.     Vitals:   05/19/23 0813  BP: (!) 120/90  Pulse: 75  Temp: 97.9 F (36.6 C)  SpO2: 96%  Weight: 242 lb 9.6 oz (110 kg)  Height: 5\' 3"  (1.6 m)    Assessment & Plan:  Visit time 25 minutes in face to face communication with patient and coordination of care,  additional 15 minutes spent in record review, coordination or care, ordering tests, communicating/referring to other healthcare professionals, documenting in medical records all on the same day of the visit for total time 40 minutes spent on the visit.

## 2023-05-19 NOTE — Assessment & Plan Note (Signed)
Checking TSH and not on meds currently.

## 2023-05-19 NOTE — Patient Instructions (Signed)
 We will check the labs today.

## 2023-05-19 NOTE — Assessment & Plan Note (Signed)
 Checking HGA1c and lipid panel. She was successful with losing weight but did regain.

## 2023-05-19 NOTE — Assessment & Plan Note (Signed)
 Under control with counseling and stress management currently will monitor.

## 2023-05-19 NOTE — Assessment & Plan Note (Signed)
 Overall active some due to stress.

## 2023-05-22 ENCOUNTER — Encounter: Payer: Self-pay | Admitting: Internal Medicine

## 2023-11-29 LAB — HM MAMMOGRAPHY: HM Mammogram: NORMAL

## 2023-12-01 LAB — HM COLONOSCOPY

## 2024-02-06 ENCOUNTER — Ambulatory Visit: Payer: Self-pay

## 2024-02-06 NOTE — Telephone Encounter (Signed)
 FYI Only or Action Required?: FYI only for provider: Urgent Care or ED advised. For cough, wheezing.  Please also be aware patient having L) buttock pain down to hip area that is new since Christmas.  Patient was last seen in primary care on 05/19/2023 by Rollene Almarie LABOR, MD.  Called Nurse Triage reporting Cough.  Symptoms began several days ago.  Interventions attempted: OTC medications: [facility] Cold/Flu.  Symptoms are: gradually worsening.  Triage Disposition: See HCP Within 4 Hours (Or PCP Triage)  Patient/caregiver understands and will follow disposition?: Yes     Copied from CRM #[id number]. Topic: Clinical - Red Word Triage >> Feb 06, 2024  3:09 PM Pinkey ORN wrote: Red Word that prompted transfer to Nurse Triage: Fatigue  >> Feb 06, 2024  3:11 PM Pinkey ORN wrote: Patient states she tested negative for the flu, but she feels awful. Patient states she's experiencing fatigue, loss of appetite, body aches, headaches as well as a cough.     Reason for Disposition  Wheezing is present  Additional Information  [1] Difficulty breathing AND [2] only present when coughing    Denies breathing difficulty.  Negative: Patient sounds very sick or weak to the triager    Harsh/severe cough.  Advised may go to Urgent Care or ED.  Answer Assessment - Initial Assessment Questions Denies breathing difficulty.  See alternative assessment.  Answer Assessment - Initial Assessment Questions 1. ONSET: When did the cough begin?      Sunday; Flu/Covid home test negative today.   2. SEVERITY: How bad is the cough today?      Harsh, severe, audible cough frequently during call; makes feel like have to gag.   Denies nausea/ vomiting.   3. SPUTUM: Describe the color of your sputum (e.g., none, dry cough; clear, white, yellow, green)     Clear, foamy; sometimes yellow; thick; denies blood.   4. HEMOPTYSIS: Are you coughing up any blood? If Yes, ask: How much? (e.g.,  flecks, streaks, tablespoons, etc.)     Denies  5. DIFFICULTY BREATHING: Are you having difficulty breathing? If Yes, ask: How bad is it? (e.g., mild, moderate, severe)      Denies, just having some moderate wheezing when coughing and trying to talk.   6. FEVER: Do you have a fever? If Yes, ask: What is your temperature, how was it measured, and when did it start?     99 .1 F this AM - taking [facility] Cold/Flu medicine which has Acetaminophen , being cautious taking dose, spreading out.   7. CARDIAC HISTORY: Do you have any history of heart disease? (e.g., heart attack, congestive heart failure)      Denies  8. LUNG HISTORY: Do you have any history of lung disease?  (e.g., pulmonary embolus, asthma, emphysema)     Denies; only has seasonal allergies  9. PE RISK FACTORS: Do you have a history of blood clots? (or: recent major surgery, recent prolonged travel, bedridden)     Denies history of blood clot; also having new pain L) buttock travels down to hip area- started before Sunday, going on since Christmas  10. OTHER SYMPTOMS: Do you have any other symptoms? (e.g., runny nose, wheezing, chest pain)       Poor appetite, fatigue, generalized weakness.  Denies feeling dizzy or light-headed.   12. TRAVEL: Have you traveled out of the country in the last month? (e.g., travel history, exposures)       Denies but has been around several friends with different strains  of influenza recently.    Severe headache, squeezing feeling. 7/10; reports history of migraines.  Doesn't feel like a regular migraine. No vision changes or flashes of light.  Feeling weak in general, no weakness/ numbness/tingling in one area.  Fatigued, just wants to sit down.  Protocols used: Breathing Difficulty-A-AH, Cough - Acute Productive-A-AH

## 2024-03-14 ENCOUNTER — Ambulatory Visit: Admitting: Internal Medicine

## 2024-03-14 ENCOUNTER — Encounter: Payer: Self-pay | Admitting: Internal Medicine

## 2024-03-14 VITALS — BP 162/92 | HR 76 | Temp 97.9°F | Resp 16 | Ht 63.0 in | Wt 245.0 lb

## 2024-03-14 DIAGNOSIS — E034 Atrophy of thyroid (acquired): Secondary | ICD-10-CM

## 2024-03-14 DIAGNOSIS — E559 Vitamin D deficiency, unspecified: Secondary | ICD-10-CM

## 2024-03-14 DIAGNOSIS — J0110 Acute frontal sinusitis, unspecified: Secondary | ICD-10-CM | POA: Insufficient documentation

## 2024-03-14 DIAGNOSIS — H6991 Unspecified Eustachian tube disorder, right ear: Secondary | ICD-10-CM | POA: Insufficient documentation

## 2024-03-14 DIAGNOSIS — G4733 Obstructive sleep apnea (adult) (pediatric): Secondary | ICD-10-CM

## 2024-03-14 DIAGNOSIS — E781 Pure hyperglyceridemia: Secondary | ICD-10-CM

## 2024-03-14 DIAGNOSIS — I10 Essential (primary) hypertension: Secondary | ICD-10-CM

## 2024-03-14 DIAGNOSIS — Z0001 Encounter for general adult medical examination with abnormal findings: Secondary | ICD-10-CM | POA: Insufficient documentation

## 2024-03-14 LAB — CBC WITH DIFFERENTIAL/PLATELET
Basophils Absolute: 0 10*3/uL (ref 0.0–0.1)
Basophils Relative: 0.2 % (ref 0.0–3.0)
Eosinophils Absolute: 0.1 10*3/uL (ref 0.0–0.7)
Eosinophils Relative: 1.3 % (ref 0.0–5.0)
HCT: 42.1 % (ref 36.0–46.0)
Hemoglobin: 14.1 g/dL (ref 12.0–15.0)
Lymphocytes Relative: 21.7 % (ref 12.0–46.0)
Lymphs Abs: 1.9 10*3/uL (ref 0.7–4.0)
MCHC: 33.6 g/dL (ref 30.0–36.0)
MCV: 85.2 fl (ref 78.0–100.0)
Monocytes Absolute: 0.6 10*3/uL (ref 0.1–1.0)
Monocytes Relative: 7.1 % (ref 3.0–12.0)
Neutro Abs: 5.9 10*3/uL (ref 1.4–7.7)
Neutrophils Relative %: 69.7 % (ref 43.0–77.0)
Platelets: 293 10*3/uL (ref 150.0–400.0)
RBC: 4.94 Mil/uL (ref 3.87–5.11)
RDW: 14.6 % (ref 11.5–15.5)
WBC: 8.5 10*3/uL (ref 4.0–10.5)

## 2024-03-14 LAB — LIPID PANEL
Cholesterol: 187 mg/dL (ref 28–200)
HDL: 55 mg/dL
LDL Cholesterol: 95 mg/dL (ref 10–99)
NonHDL: 132.05
Total CHOL/HDL Ratio: 3
Triglycerides: 185 mg/dL — ABNORMAL HIGH (ref 10.0–149.0)
VLDL: 37 mg/dL (ref 0.0–40.0)

## 2024-03-14 LAB — BASIC METABOLIC PANEL WITH GFR
BUN: 17 mg/dL (ref 6–23)
CO2: 30 meq/L (ref 19–32)
Calcium: 9.3 mg/dL (ref 8.4–10.5)
Chloride: 107 meq/L (ref 96–112)
Creatinine, Ser: 0.79 mg/dL (ref 0.40–1.20)
GFR: 83.51 mL/min
Glucose, Bld: 90 mg/dL (ref 70–99)
Potassium: 3.9 meq/L (ref 3.5–5.1)
Sodium: 142 meq/L (ref 135–145)

## 2024-03-14 LAB — URINALYSIS, ROUTINE W REFLEX MICROSCOPIC
Bilirubin Urine: NEGATIVE
Hgb urine dipstick: NEGATIVE
Ketones, ur: NEGATIVE
Leukocytes,Ua: NEGATIVE
Nitrite: NEGATIVE
RBC / HPF: NONE SEEN
Specific Gravity, Urine: 1.02 (ref 1.000–1.030)
Total Protein, Urine: NEGATIVE
Urine Glucose: NEGATIVE
Urobilinogen, UA: 0.2 (ref 0.0–1.0)
WBC, UA: NONE SEEN
pH: 6.5 (ref 5.0–8.0)

## 2024-03-14 LAB — HEPATIC FUNCTION PANEL
ALT: 19 U/L (ref 3–35)
AST: 19 U/L (ref 5–37)
Albumin: 4.4 g/dL (ref 3.5–5.2)
Alkaline Phosphatase: 101 U/L (ref 39–117)
Bilirubin, Direct: 0.1 mg/dL (ref 0.1–0.3)
Total Bilirubin: 0.5 mg/dL (ref 0.2–1.2)
Total Protein: 7 g/dL (ref 6.0–8.3)

## 2024-03-14 LAB — TSH: TSH: 3.82 u[IU]/mL (ref 0.35–5.50)

## 2024-03-14 LAB — HEMOGLOBIN A1C: Hgb A1c MFr Bld: 5.6 % (ref 4.6–6.5)

## 2024-03-14 MED ORDER — CEFPODOXIME PROXETIL 200 MG PO TABS: 200.0000 mg | ORAL_TABLET | Freq: Two times a day (BID) | ORAL | 0 refills | Status: AC

## 2024-03-14 MED ORDER — METHYLPREDNISOLONE 4 MG PO TBPK: ORAL_TABLET | ORAL | 0 refills | Status: AC

## 2024-03-14 NOTE — Progress Notes (Unsigned)
 "  Subjective:  Patient ID: [name], female    DOB: 05/14/1965  Age: 59 y.o. MRN: [id number]  CC: Sinusitis, Annual Exam, Hyperlipidemia, Hypothyroidism, and Hypertension   HPI [name] presents for a CPX and to re-establish.  Discussed the use of AI scribe software for clinical note transcription with the patient, who gave verbal consent to proceed.  History of Present Illness [name] is a 59 year old female who presents with sinus symptoms and ear discomfort.  She has been experiencing sinus symptoms for the past four to five days, characterized by head pain when blowing her nose and ear discomfort that began last night. The nasal discharge is yellowish with some blood, but one side is clear. No loss of hearing on the right side. No coughing, chest pain, fever, or chills.  She is not currently taking any medications except for ibuprofen  to manage her headache. She has not taken any decongestants. She reports that she does not currently have high blood pressure, but her blood pressure was elevated in the past when she was overweight. She has gained weight recently and finds it difficult to lose weight on her own.  She denies any trouble sleeping, snoring, or sleep apnea, although she mentions that she has always snored. She experiences gas easily but denies any abdominal pain, nausea, or vomiting. Her legs sometimes swell by the end of the day due to prolonged sitting, although she tries to get up frequently.  She has not received a flu shot or COVID vaccine recently, citing her son's adverse reaction to the COVID vaccine as a reason for her decision. Her last mammogram was in the fall of 2025, and she had a colonoscopy in October. She does not require Pap smears due to a partial hysterectomy.  She is allergic to sulfa or sulfate antibiotics.     Outpatient Medications Prior to Visit  Medication Sig Dispense Refill   calcium carbonate (TUMS - DOSED IN MG  ELEMENTAL CALCIUM) 500 MG chewable tablet Chew 1 tablet by mouth as needed for indigestion or heartburn. (Patient not taking: Reported on 05/19/2023)     cyclobenzaprine  (FLEXERIL ) 5 MG tablet take 1 tablet po twice a day as needed for pain (Patient not taking: Reported on 05/19/2023) 20 tablet 0   ibuprofen  (ADVIL ) 600 MG tablet take 1 tablet po pc every 6 hours for 5 days then prn-post operative pain 30 tablet 1   SUMAtriptan  (IMITREX ) 50 MG tablet Take 1 tablet (50 mg total) by mouth every 2 (two) hours as needed for migraine. May repeat in 2 hours if headache persists or recurs. 10 tablet 3   No facility-administered medications prior to visit.    ROS Review of Systems  Constitutional:  Positive for fatigue and unexpected weight change (wt gain). Negative for appetite change, chills, diaphoresis and fever.  HENT:  Positive for congestion, ear pain, nosebleeds, postnasal drip, rhinorrhea, sinus pressure and sinus pain. Negative for ear discharge, facial swelling, sneezing, sore throat, tinnitus, trouble swallowing and voice change.   Eyes: Negative.   Respiratory:  Positive for apnea. Negative for cough, shortness of breath and wheezing.   Cardiovascular:  Negative for chest pain, palpitations and leg swelling.  Gastrointestinal: Negative.  Negative for abdominal pain, constipation, diarrhea, nausea and rectal pain.  Endocrine: Negative.   Genitourinary: Negative.  Negative for difficulty urinating and dysuria.  Musculoskeletal: Negative.  Negative for arthralgias.  Skin: Negative.   Neurological: Negative.  Negative for dizziness, weakness,  light-headedness and headaches.  Hematological:  Negative for adenopathy. Does not bruise/bleed easily.  Psychiatric/Behavioral: Negative.      Objective:  BP (!) 162/92 (BP Location: Left Arm, Patient Position: Sitting, Cuff Size: Large) Comment: BP (R) 156/86  Pulse 76   Temp 97.9 F (36.6 C) (Oral)   Resp 16   Ht 5' 3 (1.6 m)   Wt 245 lb  (111.1 kg)   LMP 01/14/2018   SpO2 97%   BMI 43.40 kg/m   BP Readings from Last 3 Encounters:  03/14/24 (!) 162/92  05/19/23 122/84  04/11/23 114/76    Wt Readings from Last 3 Encounters:  03/14/24 245 lb (111.1 kg)  05/19/23 242 lb 9.6 oz (110 kg)  04/11/23 242 lb (109.8 kg)    Physical Exam Vitals reviewed.  Constitutional:      General: She is not in acute distress.    Appearance: She is obese. She is not toxic-appearing or diaphoretic.  HENT:     Right Ear: Hearing, tympanic membrane, ear canal and external ear normal. No middle ear effusion. Tympanic membrane is not injected.     Left Ear: Hearing, tympanic membrane, ear canal and external ear normal.  No middle ear effusion. Tympanic membrane is not injected.     Nose: Nose normal. No nasal tenderness, mucosal edema, congestion or rhinorrhea.     Right Nostril: No epistaxis.     Left Nostril: No epistaxis.     Right Sinus: No maxillary sinus tenderness or frontal sinus tenderness.     Left Sinus: No maxillary sinus tenderness or frontal sinus tenderness.     Mouth/Throat:     Mouth: Mucous membranes are moist.  Eyes:     General: No scleral icterus.    Conjunctiva/sclera: Conjunctivae normal.  Cardiovascular:     Rate and Rhythm: Normal rate and regular rhythm.     Heart sounds: Normal heart sounds, S1 normal and S2 normal.     No gallop.     Comments: EKG--- NSR, 74 bpm No LVH, Q waves, or ST/T wave changes  Pulmonary:     Effort: Pulmonary effort is normal.     Breath sounds: No stridor. No wheezing, rhonchi or rales.  Abdominal:     General: Abdomen is protuberant. Bowel sounds are normal. There is no distension.     Palpations: Abdomen is soft. There is no hepatomegaly, splenomegaly or mass.     Tenderness: There is no abdominal tenderness.     Hernia: No hernia is present.  Musculoskeletal:        General: Normal range of motion.     Cervical back: Neck supple.     Right lower leg: No edema.     Left  lower leg: No edema.  Lymphadenopathy:     Cervical: No cervical adenopathy.  Skin:    General: Skin is warm and dry.     Findings: No rash.  Neurological:     General: No focal deficit present.     Mental Status: She is alert.  Psychiatric:        Mood and Affect: Mood normal.        Behavior: Behavior normal.     Lab Results  Component Value Date   WBC 7.3 05/19/2023   HGB 14.0 05/19/2023   HCT 41.7 05/19/2023   PLT 258.0 05/19/2023   GLUCOSE 104 (H) 05/19/2023   CHOL 161 05/19/2023   TRIG 82.0 05/19/2023   HDL 52.00 05/19/2023   LDLDIRECT 131.0 01/18/2018  LDLCALC 92 05/19/2023   ALT 17 05/19/2023   AST 17 05/19/2023   NA 143 05/19/2023   K 4.3 05/19/2023   CL 108 05/19/2023   CREATININE 0.81 05/19/2023   BUN 19 05/19/2023   CO2 28 05/19/2023   TSH 3.88 05/19/2023   INR 0.9 07/10/2019   HGBA1C 5.6 05/19/2023    No results found.  Assessment & Plan:  Hypertension, unspecified type -     EKG 12-Lead -     Aldosterone + renin activity w/ ratio; Future -     TSH; Future -     Urinalysis, Routine w reflex microscopic; Future -     Hepatic function panel; Future -     CBC with Differential/Platelet; Future -     Basic metabolic panel with GFR; Future  Morbid obesity with BMI of 40.0-44.9, adult (HCC) -     TSH; Future -     Hepatic function panel; Future -     Hemoglobin A1c; Future  Hypothyroidism due to acquired atrophy of thyroid   Pure hypertriglyceridemia -     Lipid panel; Future -     Hepatic function panel; Future  Obstructive sleep apnea -     Pulmonary Visit  Encounter for general adult medical examination with abnormal findings -     Lipid panel; Future  Acute non-recurrent frontal sinusitis -     Cefpodoxime  Proxetil; Take 1 tablet (200 mg total) by mouth 2 (two) times daily for 10 days.  Dispense: 20 tablet; Refill: 0  Eustachian tube dysfunction, right -     methylPREDNISolone ; TAKE AS DIRECTED  Dispense: 21 tablet; Refill:  0     Follow-up: Return in about 3 months (around 06/11/2024).  Debby Molt, MD "

## 2024-03-14 NOTE — Patient Instructions (Signed)
 Health Maintenance, Female Adopting a healthy lifestyle and getting preventive care are important in promoting health and wellness. Ask your health care provider about: The right schedule for you to have regular tests and exams. Things you can do on your own to prevent diseases and keep yourself healthy. What should I know about diet, weight, and exercise? Eat a healthy diet  Eat a diet that includes plenty of vegetables, fruits, low-fat dairy products, and lean protein. Do not eat a lot of foods that are high in solid fats, added sugars, or sodium. Maintain a healthy weight Body mass index (BMI) is used to identify weight problems. It estimates body fat based on height and weight. Your health care provider can help determine your BMI and help you achieve or maintain a healthy weight. Get regular exercise Get regular exercise. This is one of the most important things you can do for your health. Most adults should: Exercise for at least 150 minutes each week. The exercise should increase your heart rate and make you sweat (moderate-intensity exercise). Do strengthening exercises at least twice a week. This is in addition to the moderate-intensity exercise. Spend less time sitting. Even light physical activity can be beneficial. Watch cholesterol and blood lipids Have your blood tested for lipids and cholesterol at 57 years of age, then have this test every 5 years. Have your cholesterol levels checked more often if: Your lipid or cholesterol levels are high. You are older than 57 years of age. You are at high risk for heart disease. What should I know about cancer screening? Depending on your health history and family history, you may need to have cancer screening at various ages. This may include screening for: Breast cancer. Cervical cancer. Colorectal cancer. Skin cancer. Lung cancer. What should I know about heart disease, diabetes, and high blood pressure? Blood pressure and heart  disease High blood pressure causes heart disease and increases the risk of stroke. This is more likely to develop in people who have high blood pressure readings or are overweight. Have your blood pressure checked: Every 3-5 years if you are 32-37 years of age. Every year if you are 71 years old or older. Diabetes Have regular diabetes screenings. This checks your fasting blood sugar level. Have the screening done: Once every three years after age 24 if you are at a normal weight and have a low risk for diabetes. More often and at a younger age if you are overweight or have a high risk for diabetes. What should I know about preventing infection? Hepatitis B If you have a higher risk for hepatitis B, you should be screened for this virus. Talk with your health care provider to find out if you are at risk for hepatitis B infection. Hepatitis C Testing is recommended for: Everyone born from [birth year] through 1963. Anyone with known risk factors for hepatitis C. Sexually transmitted infections (STIs) Get screened for STIs, including gonorrhea and chlamydia, if: You are sexually active and are younger than 56 years of age. You are older than 57 years of age and your health care provider tells you that you are at risk for this type of infection. Your sexual activity has changed since you were last screened, and you are at increased risk for chlamydia or gonorrhea. Ask your health care provider if you are at risk. Ask your health care provider about whether you are at high risk for HIV. Your health care provider may recommend a prescription medicine to help prevent HIV  infection. If you choose to take medicine to prevent HIV, you should first get tested for HIV. You should then be tested every 3 months for as long as you are taking the medicine. Pregnancy If you are about to stop having your period (premenopausal) and you may become pregnant, seek counseling before you get pregnant. Take 400 to 800  micrograms (mcg) of folic acid every day if you become pregnant. Ask for birth control (contraception) if you want to prevent pregnancy. Osteoporosis and menopause Osteoporosis is a disease in which the bones lose minerals and strength with aging. This can result in bone fractures. If you are 42 years old or older, or if you are at risk for osteoporosis and fractures, ask your health care provider if you should: Be screened for bone loss. Take a calcium  or vitamin D  supplement to lower your risk of fractures. Be given hormone replacement therapy (HRT) to treat symptoms of menopause. Follow these instructions at home: Alcohol use Do not drink alcohol if: Your health care provider tells you not to drink. You are pregnant, may be pregnant, or are planning to become pregnant. If you drink alcohol: Limit how much you have to: 0-1 drink a day. Know how much alcohol is in your drink. In the [place] one drink equals one 12 oz bottle of beer (355 mL), one 5 oz glass of wine (148 mL), or one 1 oz glass of hard liquor (44 mL). Lifestyle Do not use any products that contain nicotine or tobacco. These products include cigarettes, chewing tobacco, and vaping devices, such as e-cigarettes. If you need help quitting, ask your health care provider. Do not use street drugs. Do not share needles. Ask your health care provider for help if you need support or information about quitting drugs. General instructions Schedule regular health, dental, and eye exams. Stay current with your vaccines. Tell your health care provider if: You often feel depressed. You have ever been abused or do not feel safe at home. This information is not intended to replace advice given to you by your health care provider. Make sure you discuss any questions you have with your health care provider. Document Revised: 08/02/2023 Document Reviewed: 06/15/2020 Elsevier Patient Education  2025 [employer].

## 2024-03-15 ENCOUNTER — Ambulatory Visit: Payer: Self-pay | Admitting: Internal Medicine

## 2024-03-15 ENCOUNTER — Telehealth: Payer: Self-pay

## 2024-03-15 ENCOUNTER — Encounter: Payer: Self-pay | Admitting: Internal Medicine

## 2024-03-15 DIAGNOSIS — I10 Essential (primary) hypertension: Secondary | ICD-10-CM | POA: Insufficient documentation

## 2024-03-15 DIAGNOSIS — E559 Vitamin D deficiency, unspecified: Secondary | ICD-10-CM | POA: Insufficient documentation

## 2024-03-15 MED ORDER — INDAPAMIDE 1.25 MG PO TABS: 1.2500 mg | ORAL_TABLET | Freq: Every day | ORAL | 1 refills | Status: AC

## 2024-03-15 MED ORDER — OLMESARTAN MEDOXOMIL 20 MG PO TABS: 20.0000 mg | ORAL_TABLET | Freq: Every day | ORAL | 1 refills | Status: AC

## 2024-03-15 NOTE — Telephone Encounter (Signed)
Vit D added.  

## 2024-03-15 NOTE — Progress Notes (Signed)
 Care Guide Pharmacy Note  03/15/2024 Name: [name] MRN: [id number] DOB: 12-04-1965  Referred By: Vernadine Charlie ORN, MD Reason for referral: Call Attempt #1 and Complex Care Management (Outreach to sch ref w/ Pharm)   [name] is a 59 y.o. year old female who is a primary care patient of Tisovec, Charlie ORN, MD.  [name] was referred to the pharmacist for assistance related to: HTN  Successful contact was made with the patient to discuss pharmacy services including being ready for the pharmacist to call at least 5 minutes before the scheduled appointment time and to have medication bottles and any blood pressure readings ready for review. The patient agreed to meet with the pharmacist via telephone visit on 04/04/24.  Doyce Razor [facility], [facility] Guide Direct Dial: [phone]  Fax: [phone]

## 2024-04-04 ENCOUNTER — Other Ambulatory Visit

## 2024-04-26 ENCOUNTER — Ambulatory Visit: Admitting: Primary Care
